# Patient Record
Sex: Male | Born: 1940 | Race: Black or African American | Hispanic: No | Marital: Married | Smoking: Former smoker
Health system: Southern US, Community
[De-identification: ages and names within clinical notes are randomized; demographics above are authoritative.]

## PROBLEM LIST (undated history)

## (undated) DIAGNOSIS — E119 Type 2 diabetes mellitus without complications: Secondary | ICD-10-CM

## (undated) DIAGNOSIS — I10 Essential (primary) hypertension: Secondary | ICD-10-CM

## (undated) DIAGNOSIS — N189 Chronic kidney disease, unspecified: Secondary | ICD-10-CM

## (undated) HISTORY — DX: Type 2 diabetes mellitus without complications: E11.9

## (undated) HISTORY — DX: Chronic kidney disease, unspecified: N18.9

## (undated) HISTORY — DX: Essential (primary) hypertension: I10

---

## 2011-04-07 DIAGNOSIS — E119 Type 2 diabetes mellitus without complications: Secondary | ICD-10-CM | POA: Diagnosis not present

## 2011-06-25 DIAGNOSIS — N184 Chronic kidney disease, stage 4 (severe): Secondary | ICD-10-CM | POA: Diagnosis not present

## 2011-06-25 DIAGNOSIS — E119 Type 2 diabetes mellitus without complications: Secondary | ICD-10-CM | POA: Diagnosis not present

## 2011-06-25 DIAGNOSIS — I10 Essential (primary) hypertension: Secondary | ICD-10-CM | POA: Diagnosis not present

## 2011-06-25 DIAGNOSIS — N400 Enlarged prostate without lower urinary tract symptoms: Secondary | ICD-10-CM | POA: Diagnosis not present

## 2011-10-29 DIAGNOSIS — N184 Chronic kidney disease, stage 4 (severe): Secondary | ICD-10-CM | POA: Diagnosis not present

## 2011-10-29 DIAGNOSIS — E119 Type 2 diabetes mellitus without complications: Secondary | ICD-10-CM | POA: Diagnosis not present

## 2011-10-29 DIAGNOSIS — D631 Anemia in chronic kidney disease: Secondary | ICD-10-CM | POA: Diagnosis not present

## 2011-10-29 DIAGNOSIS — I10 Essential (primary) hypertension: Secondary | ICD-10-CM | POA: Diagnosis not present

## 2011-10-29 DIAGNOSIS — N039 Chronic nephritic syndrome with unspecified morphologic changes: Secondary | ICD-10-CM | POA: Diagnosis not present

## 2011-11-02 DIAGNOSIS — E119 Type 2 diabetes mellitus without complications: Secondary | ICD-10-CM | POA: Diagnosis not present

## 2011-11-02 DIAGNOSIS — Z23 Encounter for immunization: Secondary | ICD-10-CM | POA: Diagnosis not present

## 2011-11-17 DIAGNOSIS — M109 Gout, unspecified: Secondary | ICD-10-CM | POA: Diagnosis present

## 2011-11-17 DIAGNOSIS — N183 Chronic kidney disease, stage 3 unspecified: Secondary | ICD-10-CM | POA: Diagnosis not present

## 2011-11-17 DIAGNOSIS — E785 Hyperlipidemia, unspecified: Secondary | ICD-10-CM | POA: Diagnosis present

## 2011-11-17 DIAGNOSIS — I6350 Cerebral infarction due to unspecified occlusion or stenosis of unspecified cerebral artery: Secondary | ICD-10-CM | POA: Diagnosis not present

## 2011-11-17 DIAGNOSIS — I10 Essential (primary) hypertension: Secondary | ICD-10-CM | POA: Diagnosis not present

## 2011-11-17 DIAGNOSIS — E119 Type 2 diabetes mellitus without complications: Secondary | ICD-10-CM | POA: Diagnosis not present

## 2011-11-17 DIAGNOSIS — Z87891 Personal history of nicotine dependence: Secondary | ICD-10-CM | POA: Diagnosis not present

## 2011-11-17 DIAGNOSIS — K083 Retained dental root: Secondary | ICD-10-CM | POA: Diagnosis not present

## 2011-11-17 DIAGNOSIS — R5383 Other fatigue: Secondary | ICD-10-CM | POA: Diagnosis not present

## 2011-11-17 DIAGNOSIS — I658 Occlusion and stenosis of other precerebral arteries: Secondary | ICD-10-CM | POA: Diagnosis not present

## 2011-11-17 DIAGNOSIS — R5381 Other malaise: Secondary | ICD-10-CM | POA: Diagnosis not present

## 2011-11-17 DIAGNOSIS — I129 Hypertensive chronic kidney disease with stage 1 through stage 4 chronic kidney disease, or unspecified chronic kidney disease: Secondary | ICD-10-CM | POA: Diagnosis present

## 2012-03-22 DIAGNOSIS — N184 Chronic kidney disease, stage 4 (severe): Secondary | ICD-10-CM | POA: Diagnosis not present

## 2012-03-22 DIAGNOSIS — E119 Type 2 diabetes mellitus without complications: Secondary | ICD-10-CM | POA: Diagnosis not present

## 2012-03-22 DIAGNOSIS — N400 Enlarged prostate without lower urinary tract symptoms: Secondary | ICD-10-CM | POA: Diagnosis not present

## 2012-03-22 DIAGNOSIS — I10 Essential (primary) hypertension: Secondary | ICD-10-CM | POA: Diagnosis not present

## 2012-03-29 DIAGNOSIS — H35039 Hypertensive retinopathy, unspecified eye: Secondary | ICD-10-CM | POA: Diagnosis not present

## 2012-03-29 DIAGNOSIS — E119 Type 2 diabetes mellitus without complications: Secondary | ICD-10-CM | POA: Diagnosis not present

## 2012-03-30 DIAGNOSIS — M109 Gout, unspecified: Secondary | ICD-10-CM | POA: Insufficient documentation

## 2012-03-30 DIAGNOSIS — H35039 Hypertensive retinopathy, unspecified eye: Secondary | ICD-10-CM | POA: Insufficient documentation

## 2012-06-21 DIAGNOSIS — I10 Essential (primary) hypertension: Secondary | ICD-10-CM | POA: Diagnosis not present

## 2012-06-21 DIAGNOSIS — N400 Enlarged prostate without lower urinary tract symptoms: Secondary | ICD-10-CM | POA: Diagnosis not present

## 2012-06-21 DIAGNOSIS — N184 Chronic kidney disease, stage 4 (severe): Secondary | ICD-10-CM | POA: Diagnosis not present

## 2012-06-21 DIAGNOSIS — E119 Type 2 diabetes mellitus without complications: Secondary | ICD-10-CM | POA: Diagnosis not present

## 2012-06-21 DIAGNOSIS — D631 Anemia in chronic kidney disease: Secondary | ICD-10-CM | POA: Diagnosis not present

## 2013-02-21 DIAGNOSIS — E119 Type 2 diabetes mellitus without complications: Secondary | ICD-10-CM | POA: Diagnosis not present

## 2013-02-21 DIAGNOSIS — D631 Anemia in chronic kidney disease: Secondary | ICD-10-CM | POA: Diagnosis not present

## 2013-02-21 DIAGNOSIS — I10 Essential (primary) hypertension: Secondary | ICD-10-CM | POA: Diagnosis not present

## 2013-02-21 DIAGNOSIS — N184 Chronic kidney disease, stage 4 (severe): Secondary | ICD-10-CM | POA: Diagnosis not present

## 2013-03-31 DIAGNOSIS — H35379 Puckering of macula, unspecified eye: Secondary | ICD-10-CM | POA: Diagnosis not present

## 2013-03-31 DIAGNOSIS — H2510 Age-related nuclear cataract, unspecified eye: Secondary | ICD-10-CM | POA: Diagnosis not present

## 2013-03-31 DIAGNOSIS — M109 Gout, unspecified: Secondary | ICD-10-CM | POA: Diagnosis not present

## 2013-03-31 DIAGNOSIS — I10 Essential (primary) hypertension: Secondary | ICD-10-CM | POA: Diagnosis not present

## 2013-03-31 DIAGNOSIS — H35039 Hypertensive retinopathy, unspecified eye: Secondary | ICD-10-CM | POA: Diagnosis not present

## 2013-03-31 DIAGNOSIS — E119 Type 2 diabetes mellitus without complications: Secondary | ICD-10-CM | POA: Diagnosis not present

## 2013-04-03 DIAGNOSIS — H35379 Puckering of macula, unspecified eye: Secondary | ICD-10-CM | POA: Insufficient documentation

## 2013-06-22 DIAGNOSIS — I10 Essential (primary) hypertension: Secondary | ICD-10-CM | POA: Diagnosis not present

## 2013-06-22 DIAGNOSIS — D631 Anemia in chronic kidney disease: Secondary | ICD-10-CM | POA: Diagnosis not present

## 2013-06-22 DIAGNOSIS — N184 Chronic kidney disease, stage 4 (severe): Secondary | ICD-10-CM | POA: Diagnosis not present

## 2013-06-22 DIAGNOSIS — E119 Type 2 diabetes mellitus without complications: Secondary | ICD-10-CM | POA: Diagnosis not present

## 2013-09-06 ENCOUNTER — Encounter: Payer: Self-pay | Admitting: Sports Medicine

## 2013-09-06 ENCOUNTER — Ambulatory Visit (INDEPENDENT_AMBULATORY_CARE_PROVIDER_SITE_OTHER): Payer: Medicare Other | Admitting: Sports Medicine

## 2013-09-06 ENCOUNTER — Ambulatory Visit (INDEPENDENT_AMBULATORY_CARE_PROVIDER_SITE_OTHER): Payer: Medicare Other

## 2013-09-06 VITALS — BP 148/80 | HR 59 | Ht 70.0 in | Wt 182.0 lb

## 2013-09-06 DIAGNOSIS — M25562 Pain in left knee: Secondary | ICD-10-CM | POA: Insufficient documentation

## 2013-09-06 DIAGNOSIS — M1A00X0 Idiopathic chronic gout, unspecified site, without tophus (tophi): Secondary | ICD-10-CM | POA: Diagnosis not present

## 2013-09-06 DIAGNOSIS — M1A09X0 Idiopathic chronic gout, multiple sites, without tophus (tophi): Secondary | ICD-10-CM

## 2013-09-06 DIAGNOSIS — N189 Chronic kidney disease, unspecified: Secondary | ICD-10-CM

## 2013-09-06 DIAGNOSIS — Z299 Encounter for prophylactic measures, unspecified: Secondary | ICD-10-CM

## 2013-09-06 DIAGNOSIS — M1710 Unilateral primary osteoarthritis, unspecified knee: Secondary | ICD-10-CM

## 2013-09-06 DIAGNOSIS — Z0000 Encounter for general adult medical examination without abnormal findings: Secondary | ICD-10-CM | POA: Insufficient documentation

## 2013-09-06 DIAGNOSIS — D509 Iron deficiency anemia, unspecified: Secondary | ICD-10-CM

## 2013-09-06 DIAGNOSIS — E118 Type 2 diabetes mellitus with unspecified complications: Secondary | ICD-10-CM

## 2013-09-06 DIAGNOSIS — N289 Disorder of kidney and ureter, unspecified: Secondary | ICD-10-CM

## 2013-09-06 DIAGNOSIS — M1A9XX0 Chronic gout, unspecified, without tophus (tophi): Secondary | ICD-10-CM

## 2013-09-06 DIAGNOSIS — I10 Essential (primary) hypertension: Secondary | ICD-10-CM | POA: Diagnosis not present

## 2013-09-06 DIAGNOSIS — IMO0002 Reserved for concepts with insufficient information to code with codable children: Secondary | ICD-10-CM | POA: Diagnosis not present

## 2013-09-06 DIAGNOSIS — N186 End stage renal disease: Secondary | ICD-10-CM | POA: Insufficient documentation

## 2013-09-06 DIAGNOSIS — E119 Type 2 diabetes mellitus without complications: Secondary | ICD-10-CM | POA: Diagnosis not present

## 2013-09-06 DIAGNOSIS — M25569 Pain in unspecified knee: Secondary | ICD-10-CM

## 2013-09-06 DIAGNOSIS — M1120 Other chondrocalcinosis, unspecified site: Secondary | ICD-10-CM | POA: Insufficient documentation

## 2013-09-06 DIAGNOSIS — D539 Nutritional anemia, unspecified: Secondary | ICD-10-CM

## 2013-09-06 DIAGNOSIS — Z992 Dependence on renal dialysis: Secondary | ICD-10-CM | POA: Insufficient documentation

## 2013-09-06 IMAGING — CR DG KNEE COMPLETE 4+V*L*
3 series · 3 of 3 positions shown · non-contrast
Comparison: None.

CLINICAL DATA: Left knee pain

EXAM:
LEFT KNEE - COMPLETE 4+ VIEW

[view not recorded (1 of 3)]
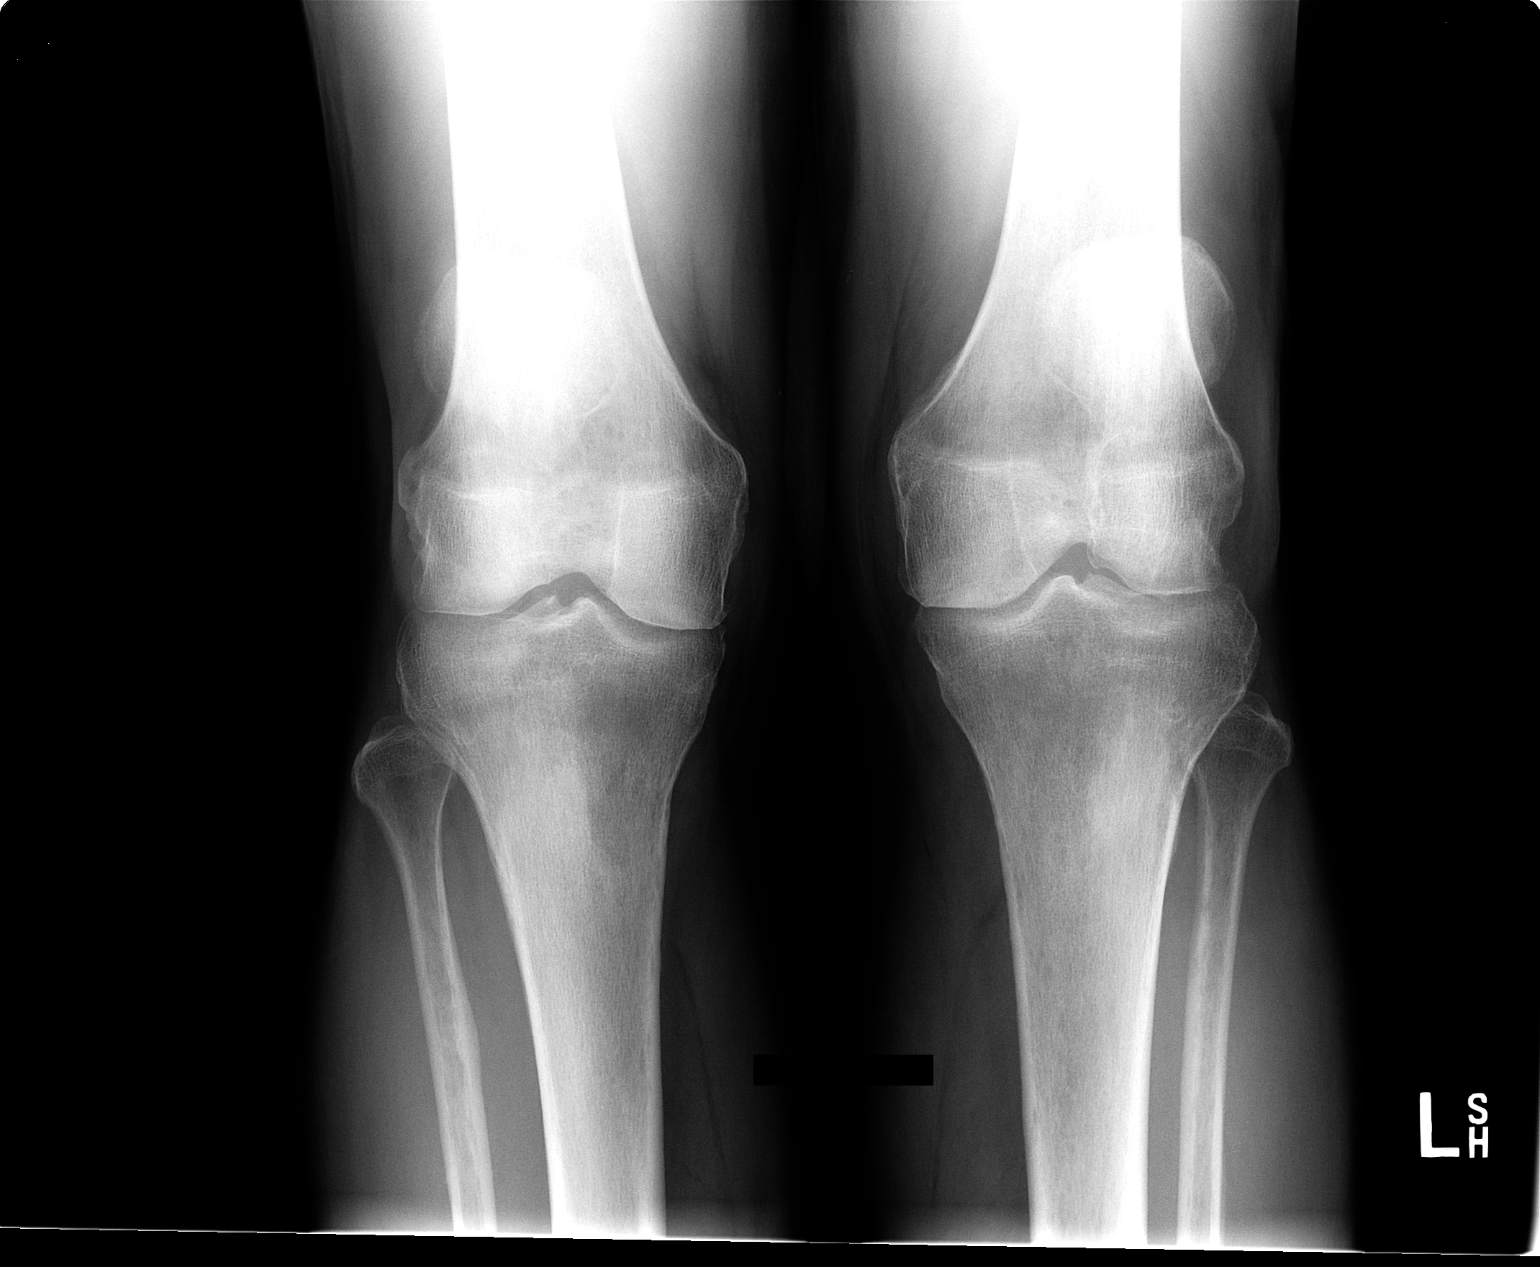

[view not recorded (2 of 3)]
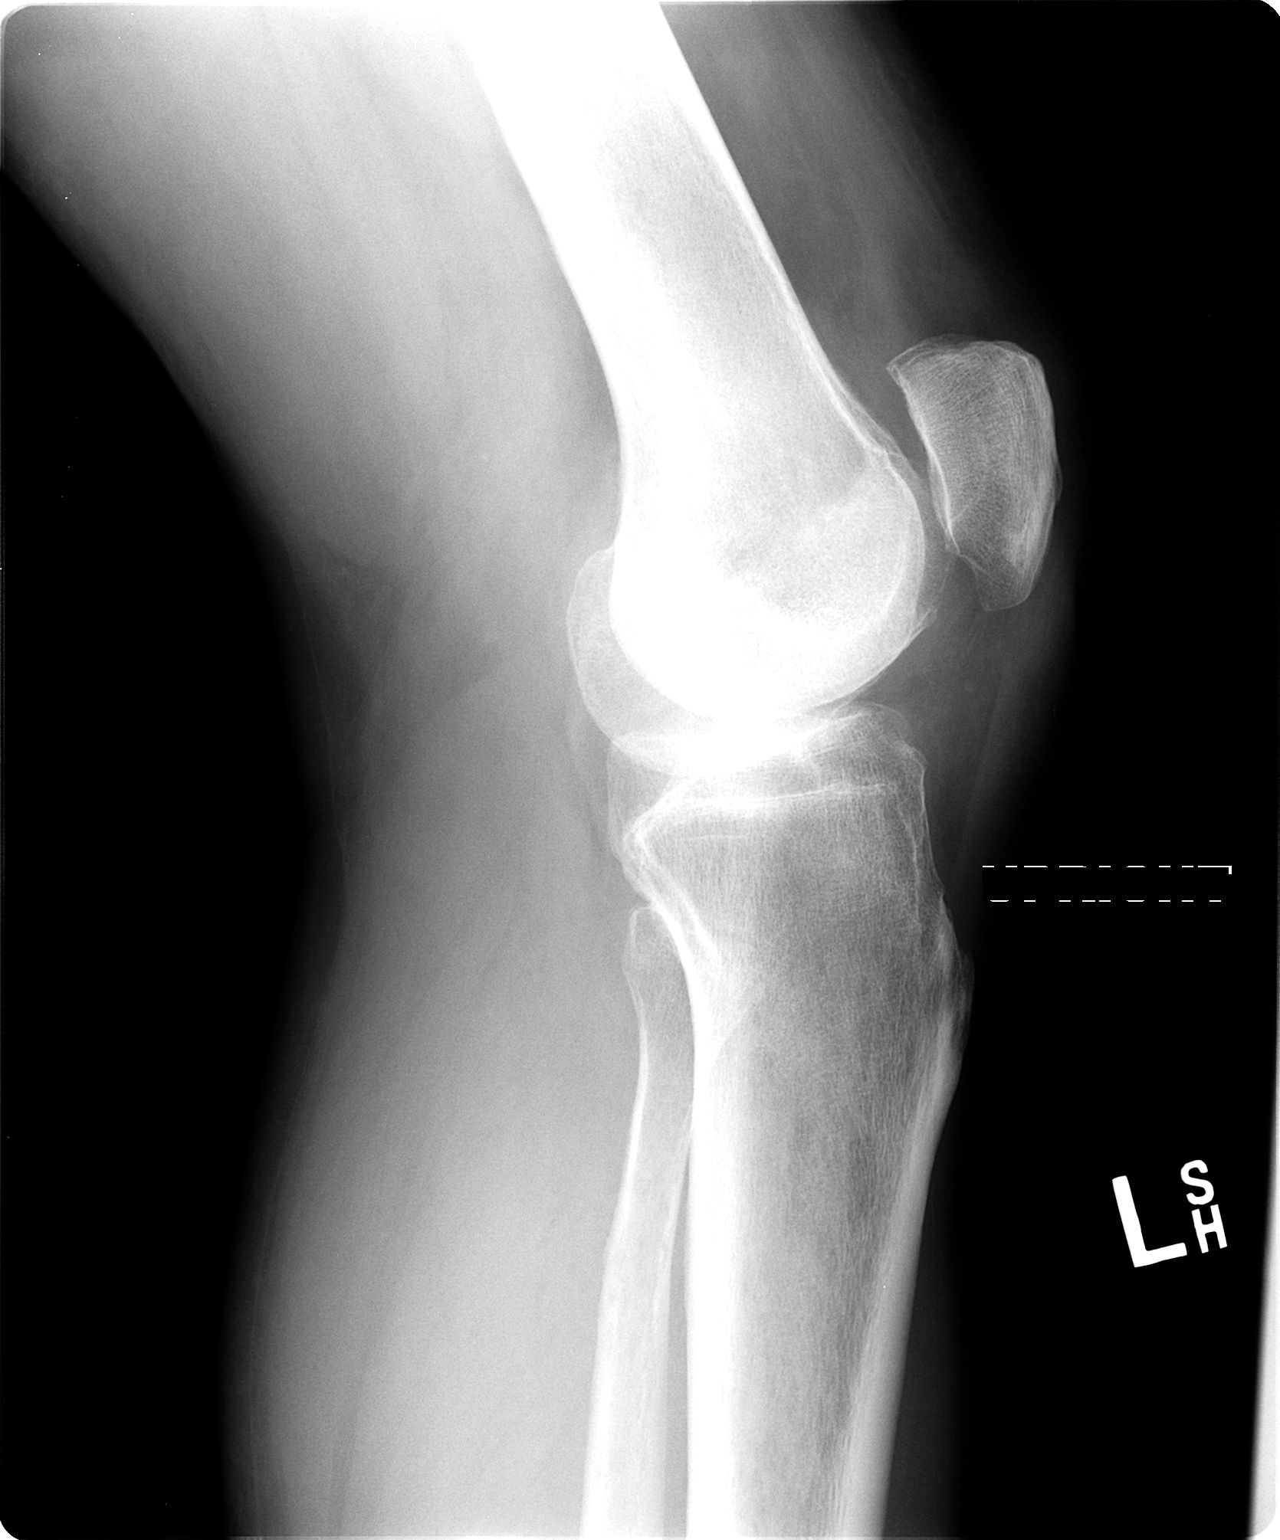

[view not recorded (3 of 3)]
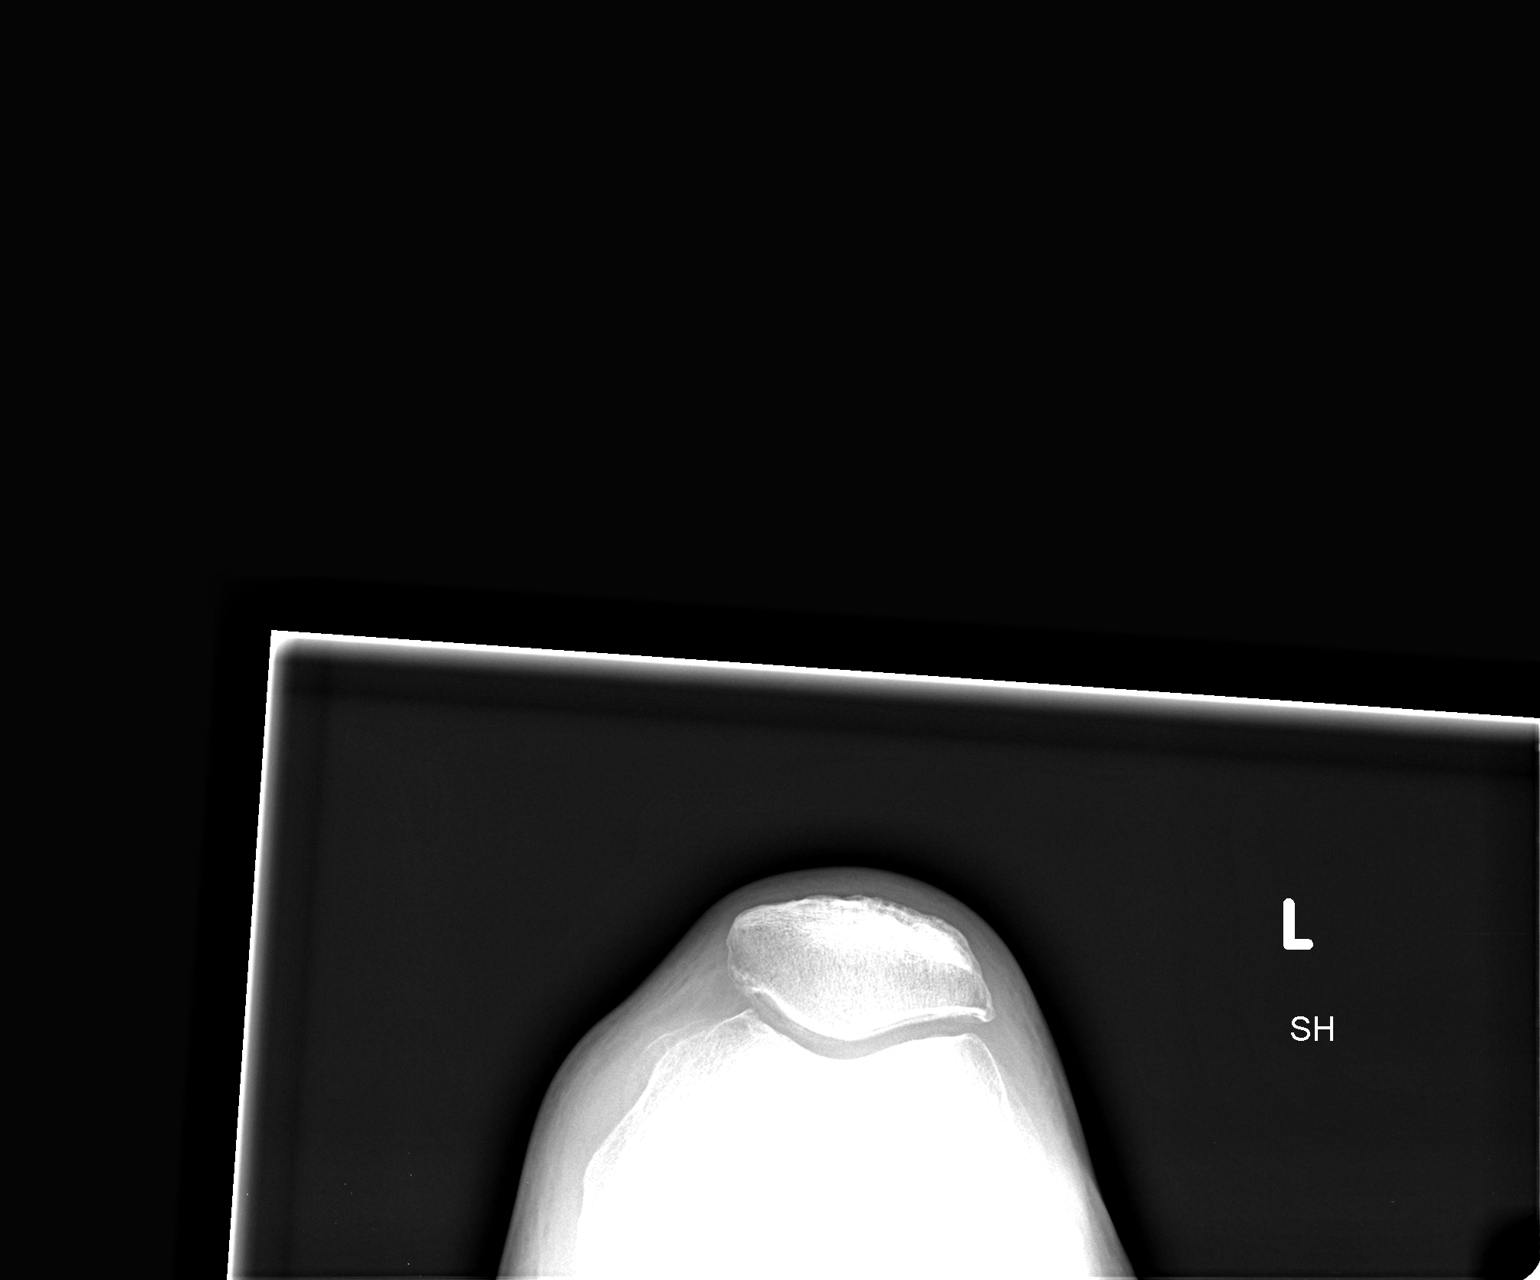

[3 of 3 positions shown; findings below may reference images not displayed]

FINDINGS: No acute fracture or dislocation. Mild tricompartmental
osteoarthritis. Small joint effusion. No lytic or blastic osseous
lesion. Normal soft tissues.
IMPRESSION: Mild tricompartmental osteoarthritis.

## 2013-09-06 IMAGING — CR DG KNEE COMPLETE 4+V*L*
1 series · 1 of 1 positions shown · non-contrast
Comparison: None.

CLINICAL DATA: Left knee pain

EXAM:
LEFT KNEE - COMPLETE 4+ VIEW

[view not recorded]
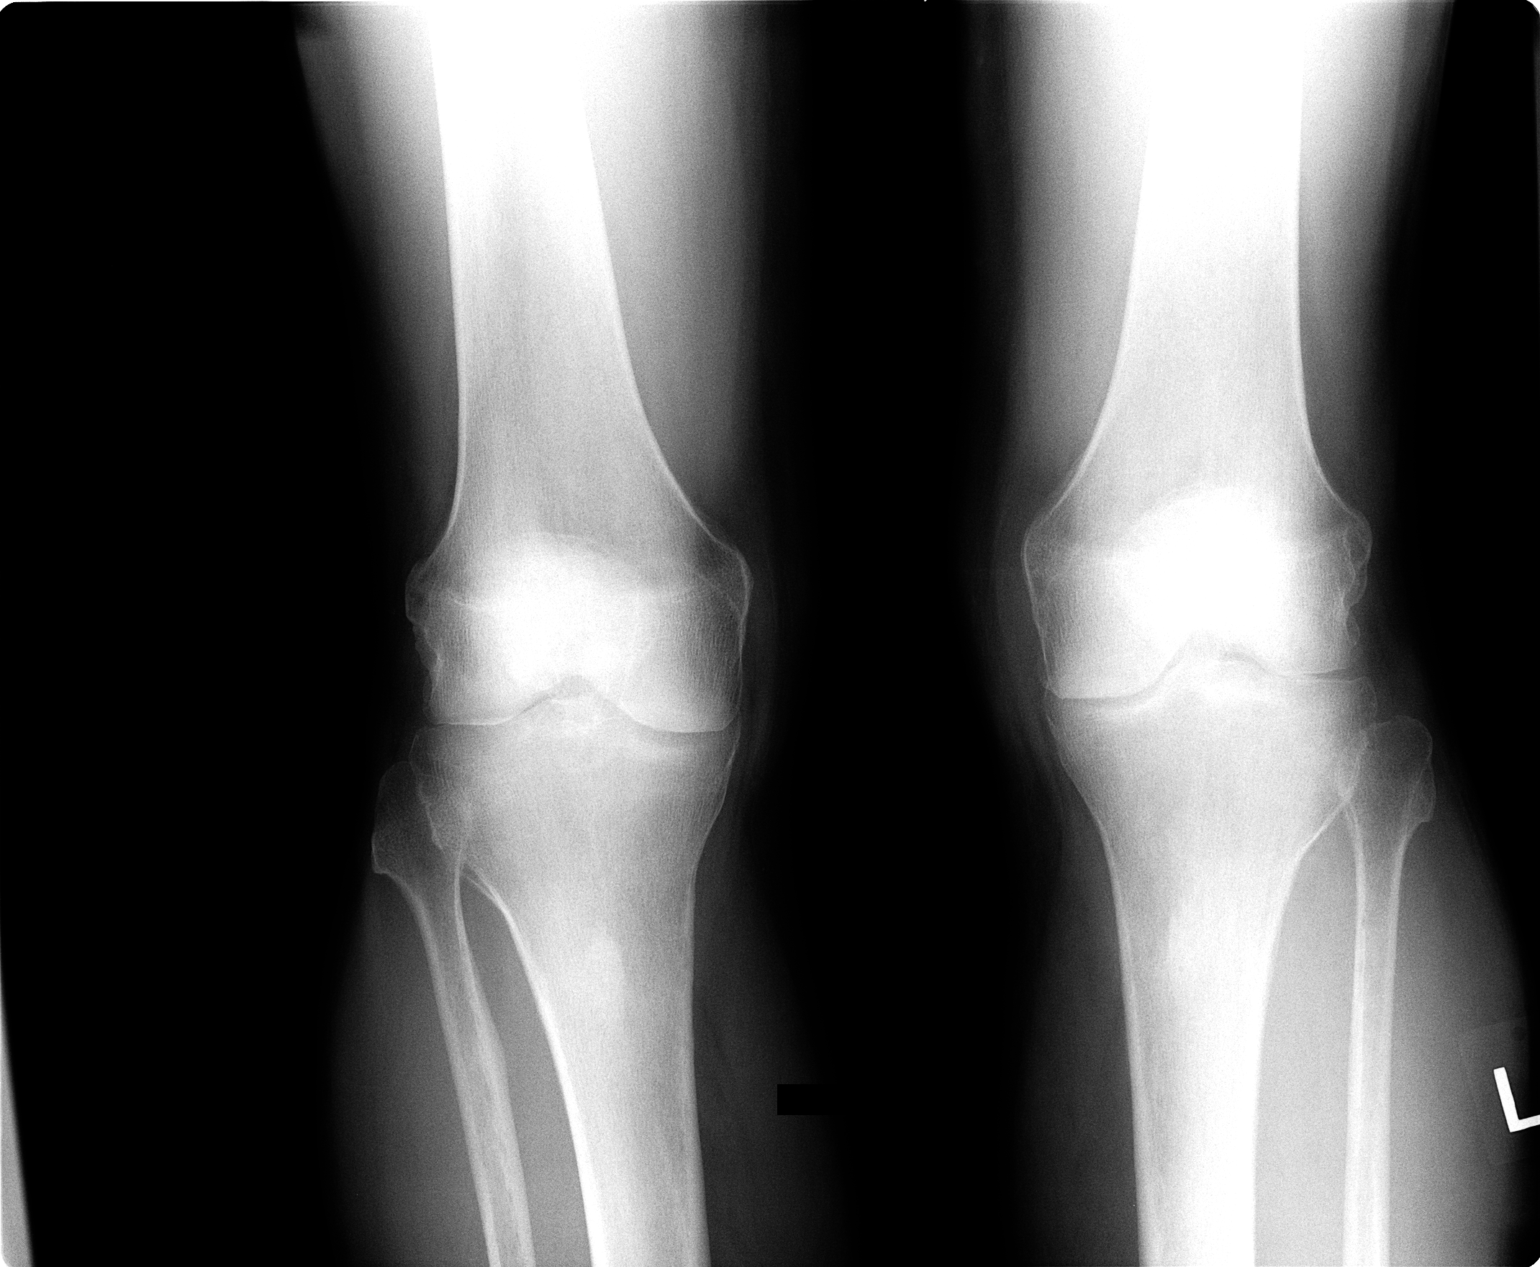

[1 of 1 positions shown; findings below may reference images not displayed]

FINDINGS: No acute fracture or dislocation. Mild tricompartmental
osteoarthritis. Small joint effusion. No lytic or blastic osseous
lesion. Normal soft tissues.
IMPRESSION: Mild tricompartmental osteoarthritis.

## 2013-09-06 NOTE — Assessment & Plan Note (Signed)
He has a Medicare physical coming up.

## 2013-09-06 NOTE — Assessment & Plan Note (Signed)
Continue losartan, and Actos. No metformin due to renal insufficiency.

## 2013-09-06 NOTE — Assessment & Plan Note (Signed)
Slightly elevated, we will keep an eye on this.

## 2013-09-06 NOTE — Assessment & Plan Note (Signed)
Checking labs, we will avoid nephrotoxic agents.

## 2013-09-06 NOTE — Assessment & Plan Note (Signed)
Continue allopurinol. Rechecking uric acid levels.

## 2013-09-06 NOTE — Progress Notes (Signed)
  Subjective:    CC: Establish care.   HPI:  Diabetes mellitus type 2: Has been well-controlled on Actos, he does have renal insufficiency and has not taken metformin.  Renal insufficiency: Due to uncontrolled hypertension in the past.  Hypertension: Is typically well controlled on current regimen, no headaches, visual changes, chest pain.  Left knee pain: Moderate, persistent, localized to the medial joint line, came on gradually, was only minimally swollen. Tylenol was tremendous the affected.  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  Past medical history, Surgical history, Family history not pertinant except as noted below, Social history, Allergies, and medications have been entered into the medical record, reviewed, and no changes needed.   Review of Systems: No headache, visual changes, nausea, vomiting, diarrhea, constipation, dizziness, abdominal pain, skin rash, fevers, chills, night sweats, swollen lymph nodes, weight loss, chest pain, body aches, joint swelling, muscle aches, shortness of breath, mood changes, visual or auditory hallucinations.  Objective:    General: Well Developed, well nourished, and in no acute distress.  Neuro: Alert and oriented x3, extra-ocular muscles intact, sensation grossly intact.  HEENT: Normocephalic, atraumatic, pupils equal round reactive to light, neck supple, no masses, no lymphadenopathy, thyroid nonpalpable.  Skin: Warm and dry, no rashes noted.  Cardiac: Regular rate and rhythm, no murmurs rubs or gallops.  Respiratory: Clear to auscultation bilaterally. Not using accessory muscles, speaking in full sentences.  Abdominal: Soft, nontender, nondistended, positive bowel sounds, no masses, no organomegaly.  Left Knee: Normal to inspection with no erythema or effusion or obvious bony abnormalities. Palpation normal with no warmth or joint line tenderness or patellar tenderness or condyle  tenderness. ROM normal in flexion and extension and lower leg rotation. Ligaments with solid consistent endpoints including ACL, PCL, LCL, MCL. Negative Mcmurray's and provocative meniscal tests. Non painful patellar compression. Patellar and quadriceps tendons unremarkable. Hamstring and quadriceps strength is normal.  X-rays do show mild osteoarthritis.  Impression and Recommendations:    The patient was counselled, risk factors were discussed, anticipatory guidance given.

## 2013-09-06 NOTE — Assessment & Plan Note (Signed)
Most likely osteoarthritis. X-rays. Injections for flares, need to avoid NSAIDs.

## 2013-09-07 DIAGNOSIS — M1A9XX0 Chronic gout, unspecified, without tophus (tophi): Secondary | ICD-10-CM | POA: Diagnosis not present

## 2013-09-07 DIAGNOSIS — M1A00X0 Idiopathic chronic gout, unspecified site, without tophus (tophi): Secondary | ICD-10-CM | POA: Diagnosis not present

## 2013-09-07 DIAGNOSIS — N189 Chronic kidney disease, unspecified: Secondary | ICD-10-CM | POA: Diagnosis not present

## 2013-09-07 DIAGNOSIS — E118 Type 2 diabetes mellitus with unspecified complications: Secondary | ICD-10-CM | POA: Diagnosis not present

## 2013-09-07 LAB — CBC
HCT: 34.4 % — ABNORMAL LOW (ref 39.0–52.0)
Hemoglobin: 11.2 g/dL — ABNORMAL LOW (ref 13.0–17.0)
MCH: 24.3 pg — ABNORMAL LOW (ref 26.0–34.0)
MCHC: 32.6 g/dL (ref 30.0–36.0)
MCV: 74.8 fL — ABNORMAL LOW (ref 78.0–100.0)
Platelets: 175 10*3/uL (ref 150–400)
RBC: 4.6 MIL/uL (ref 4.22–5.81)
RDW: 18.8 % — ABNORMAL HIGH (ref 11.5–15.5)
WBC: 5.9 10*3/uL (ref 4.0–10.5)

## 2013-09-08 DIAGNOSIS — N189 Chronic kidney disease, unspecified: Secondary | ICD-10-CM | POA: Insufficient documentation

## 2013-09-08 DIAGNOSIS — D631 Anemia in chronic kidney disease: Secondary | ICD-10-CM | POA: Insufficient documentation

## 2013-09-08 LAB — COMPREHENSIVE METABOLIC PANEL WITH GFR
Albumin: 4.1 g/dL (ref 3.5–5.2)
Glucose, Bld: 143 mg/dL — ABNORMAL HIGH (ref 70–99)
Potassium: 5.5 meq/L — ABNORMAL HIGH (ref 3.5–5.3)
Sodium: 137 meq/L (ref 135–145)
Total Bilirubin: 0.5 mg/dL (ref 0.2–1.2)
Total Protein: 6.9 g/dL (ref 6.0–8.3)

## 2013-09-08 LAB — LIPID PANEL
Cholesterol: 151 mg/dL (ref 0–200)
HDL: 41 mg/dL (ref >39)
LDL Cholesterol: 89 mg/dL (ref 0–99)
Total CHOL/HDL Ratio: 3.7 Ratio
Triglycerides: 103 mg/dL (ref <150)
VLDL: 21 mg/dL (ref 0–40)

## 2013-09-08 LAB — COMPREHENSIVE METABOLIC PANEL
ALT: 18 U/L (ref 0–53)
AST: 28 U/L (ref 0–37)
Alkaline Phosphatase: 78 U/L (ref 39–117)
BUN: 42 mg/dL — ABNORMAL HIGH (ref 6–23)
CO2: 22 mEq/L (ref 19–32)
Calcium: 9.5 mg/dL (ref 8.4–10.5)
Chloride: 106 mEq/L (ref 96–112)
Creat: 3.19 mg/dL — ABNORMAL HIGH (ref 0.50–1.35)

## 2013-09-08 LAB — IRON AND TIBC
%SAT: 28 % (ref 20–55)
Iron: 107 ug/dL (ref 42–165)
TIBC: 384 ug/dL (ref 215–435)
UIBC: 277 ug/dL (ref 125–400)

## 2013-09-08 LAB — HEMOGLOBIN A1C
Hgb A1c MFr Bld: 6.9 % — ABNORMAL HIGH (ref <5.7)
Mean Plasma Glucose: 151 mg/dL — ABNORMAL HIGH (ref <117)

## 2013-09-08 LAB — FERRITIN: Ferritin: 95 ng/mL (ref 22–322)

## 2013-09-08 LAB — TSH: TSH: 1.508 u[IU]/mL (ref 0.350–4.500)

## 2013-09-08 LAB — URIC ACID: Uric Acid, Serum: 3.7 mg/dL — ABNORMAL LOW (ref 4.0–7.8)

## 2013-09-08 NOTE — Addendum Note (Signed)
Addended by: Silverio Decamp on: 09/08/2013 09:48 AM   Modules accepted: Orders

## 2013-09-08 NOTE — Assessment & Plan Note (Signed)
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 Also adding hemoglobin electrophoresis.

## 2013-09-12 LAB — HEMOGLOBINOPATHY EVALUATION
Hemoglobin Other: 0 %
Hgb A2 Quant: 3 % (ref 2.2–3.2)
Hgb A: 97 % (ref 96.8–97.8)
Hgb F Quant: 0 % (ref 0.0–2.0)
Hgb S Quant: 0 %

## 2013-10-24 DIAGNOSIS — E119 Type 2 diabetes mellitus without complications: Secondary | ICD-10-CM | POA: Diagnosis not present

## 2013-10-24 DIAGNOSIS — I10 Essential (primary) hypertension: Secondary | ICD-10-CM | POA: Diagnosis not present

## 2013-10-24 DIAGNOSIS — N184 Chronic kidney disease, stage 4 (severe): Secondary | ICD-10-CM | POA: Diagnosis not present

## 2014-01-23 DIAGNOSIS — E119 Type 2 diabetes mellitus without complications: Secondary | ICD-10-CM | POA: Diagnosis not present

## 2014-01-23 DIAGNOSIS — N184 Chronic kidney disease, stage 4 (severe): Secondary | ICD-10-CM | POA: Diagnosis not present

## 2014-01-23 DIAGNOSIS — D631 Anemia in chronic kidney disease: Secondary | ICD-10-CM | POA: Diagnosis not present

## 2014-01-23 DIAGNOSIS — I10 Essential (primary) hypertension: Secondary | ICD-10-CM | POA: Diagnosis not present

## 2014-02-21 DIAGNOSIS — Z23 Encounter for immunization: Secondary | ICD-10-CM | POA: Diagnosis not present

## 2014-04-26 DIAGNOSIS — E119 Type 2 diabetes mellitus without complications: Secondary | ICD-10-CM | POA: Diagnosis not present

## 2014-04-26 DIAGNOSIS — D631 Anemia in chronic kidney disease: Secondary | ICD-10-CM | POA: Diagnosis not present

## 2014-04-26 DIAGNOSIS — N184 Chronic kidney disease, stage 4 (severe): Secondary | ICD-10-CM | POA: Diagnosis not present

## 2014-04-26 DIAGNOSIS — I10 Essential (primary) hypertension: Secondary | ICD-10-CM | POA: Diagnosis not present

## 2014-06-21 DIAGNOSIS — H35033 Hypertensive retinopathy, bilateral: Secondary | ICD-10-CM | POA: Diagnosis not present

## 2014-06-21 DIAGNOSIS — H2513 Age-related nuclear cataract, bilateral: Secondary | ICD-10-CM | POA: Diagnosis not present

## 2014-06-21 DIAGNOSIS — H269 Unspecified cataract: Secondary | ICD-10-CM | POA: Diagnosis not present

## 2014-06-21 DIAGNOSIS — E119 Type 2 diabetes mellitus without complications: Secondary | ICD-10-CM | POA: Diagnosis not present

## 2014-06-21 DIAGNOSIS — I10 Essential (primary) hypertension: Secondary | ICD-10-CM | POA: Diagnosis not present

## 2014-06-21 DIAGNOSIS — Z87891 Personal history of nicotine dependence: Secondary | ICD-10-CM | POA: Diagnosis not present

## 2014-07-17 DIAGNOSIS — L2084 Intrinsic (allergic) eczema: Secondary | ICD-10-CM | POA: Diagnosis not present

## 2014-07-27 DIAGNOSIS — N184 Chronic kidney disease, stage 4 (severe): Secondary | ICD-10-CM | POA: Diagnosis not present

## 2014-07-27 DIAGNOSIS — D649 Anemia, unspecified: Secondary | ICD-10-CM | POA: Diagnosis not present

## 2014-11-02 DIAGNOSIS — N184 Chronic kidney disease, stage 4 (severe): Secondary | ICD-10-CM | POA: Diagnosis not present

## 2014-11-29 ENCOUNTER — Encounter: Payer: Self-pay | Admitting: Sports Medicine

## 2014-11-29 ENCOUNTER — Ambulatory Visit (INDEPENDENT_AMBULATORY_CARE_PROVIDER_SITE_OTHER): Payer: Medicare Other | Admitting: Sports Medicine

## 2014-11-29 DIAGNOSIS — E119 Type 2 diabetes mellitus without complications: Secondary | ICD-10-CM | POA: Diagnosis not present

## 2014-11-29 DIAGNOSIS — I10 Essential (primary) hypertension: Secondary | ICD-10-CM

## 2014-11-29 MED ORDER — CARVEDILOL 6.25 MG PO TABS: 6.2500 mg | ORAL_TABLET | Freq: Two times a day (BID) | ORAL | Status: DC

## 2014-11-29 MED ORDER — AMLODIPINE BESY-BENAZEPRIL HCL 10-20 MG PO CAPS: 1.0000 | ORAL_CAPSULE | Freq: Every day | ORAL | Status: DC

## 2014-11-29 MED ORDER — BAYER CONTOUR MONITOR W/DEVICE KIT: PACK | Status: AC

## 2014-11-29 NOTE — Assessment & Plan Note (Signed)
Uncontrolled, we are going to change his entire regimen. Discontinue losartan, clonidine. Continue amlodipine, switching to [employer], this does have a thiazide and so he understands that we will need to keep an eye on his gout symptoms. Switching atenolol to low-dose carvedilol. Return to see me in 2 weeks.

## 2014-11-29 NOTE — Assessment & Plan Note (Signed)
Patient will return for a complete physical, he will continue his Actos. I am going to order routine blood work. Refilling his glucose meter.

## 2014-11-29 NOTE — Progress Notes (Signed)
  Subjective:    CC: Fluctuating blood pressure  HPI: Hypertension: Has only been minimally controlled with losartan, amlodipine, atenolol, and he was recently placed on clonidine by another provider. Unfortunately his blood pressures continue to fluctuate as expected with clonidine, as high as 200 sometimes. He denies any headaches, visual changes, chest pain.  Diabetes mellitus type 2: Has been stable we haven't checked blood work in some time, he is also due for multiple preventive measures.  Gout: Under good control.  Past medical history, Surgical history, Family history not pertinant except as noted below, Social history, Allergies, and medications have been entered into the medical record, reviewed, and no changes needed.   Review of Systems: No fevers, chills, night sweats, weight loss, chest pain, or shortness of breath.   Objective:    General: Well Developed, well nourished, and in no acute distress.  Neuro: Alert and oriented x3, extra-ocular muscles intact, sensation grossly intact.  HEENT: Normocephalic, atraumatic, pupils equal round reactive to light, neck supple, no masses, no lymphadenopathy, thyroid nonpalpable.  Skin: Warm and dry, no rashes. Cardiac: Regular rate and rhythm, no murmurs rubs or gallops, no lower extremity edema.  Respiratory: Clear to auscultation bilaterally. Not using accessory muscles, speaking in full sentences.  Impression and Recommendations:

## 2014-11-30 DIAGNOSIS — E119 Type 2 diabetes mellitus without complications: Secondary | ICD-10-CM | POA: Diagnosis not present

## 2014-12-01 LAB — COMPREHENSIVE METABOLIC PANEL WITH GFR
AST: 21 U/L (ref 10–35)
Albumin: 4.1 g/dL (ref 3.6–5.1)
BUN: 43 mg/dL — ABNORMAL HIGH (ref 7–25)
Creat: 3.53 mg/dL — ABNORMAL HIGH (ref 0.70–1.18)
Sodium: 138 mmol/L (ref 135–146)
Total Bilirubin: 0.5 mg/dL (ref 0.2–1.2)
Total Protein: 7.4 g/dL (ref 6.1–8.1)

## 2014-12-01 LAB — LIPID PANEL
Cholesterol: 147 mg/dL (ref 125–200)
HDL: 43 mg/dL (ref >=40)
LDL Cholesterol: 90 mg/dL (ref <130)
Total CHOL/HDL Ratio: 3.4 ratio (ref <=5.0)
Triglycerides: 68 mg/dL (ref <150)
VLDL: 14 mg/dL (ref <30)

## 2014-12-01 LAB — COMPREHENSIVE METABOLIC PANEL
ALT: 12 U/L (ref 9–46)
Alkaline Phosphatase: 86 U/L (ref 40–115)
CO2: 21 mmol/L (ref 20–31)
Calcium: 9.4 mg/dL (ref 8.6–10.3)
Chloride: 107 mmol/L (ref 98–110)
Glucose, Bld: 136 mg/dL — ABNORMAL HIGH (ref 65–99)
Potassium: 4.8 mmol/L (ref 3.5–5.3)

## 2014-12-01 LAB — CBC
HCT: 34.1 % — ABNORMAL LOW (ref 39.0–52.0)
Hemoglobin: 10.7 g/dL — ABNORMAL LOW (ref 13.0–17.0)
MCH: 24.5 pg — ABNORMAL LOW (ref 26.0–34.0)
MCHC: 31.4 g/dL (ref 30.0–36.0)
MCV: 78.2 fL (ref 78.0–100.0)
Platelets: 155 K/uL (ref 150–400)
RBC: 4.36 MIL/uL (ref 4.22–5.81)
RDW: 20.1 % — ABNORMAL HIGH (ref 11.5–15.5)
WBC: 6.3 K/uL (ref 4.0–10.5)

## 2014-12-01 LAB — HEMOGLOBIN A1C
Hgb A1c MFr Bld: 6.6 % — ABNORMAL HIGH (ref <5.7)
Mean Plasma Glucose: 143 mg/dL — ABNORMAL HIGH (ref <117)

## 2014-12-04 ENCOUNTER — Telehealth: Payer: Self-pay

## 2014-12-04 NOTE — Telephone Encounter (Signed)
It has only been a few days, give it some time, is he definitely taking the new medication benazepril/amlodipine daily? Also spironolactone is not a good idea considering his renal insufficiency, it can potentially result in severe hyperkalemia, what was his heart rate when he has been checking his blood pressures?

## 2014-12-04 NOTE — Telephone Encounter (Signed)
SPOKE TO PATIENT GAVE HIM LAB RESULTS PATIENT STATED THAT THE NEW BLOOD PRESSURE MEDICATION IS NOT WORKING FOR HIM HIS BP IS STILL RANGING FROM 140 TO 170s FOR  TOP NUMBER. PATIENT WOULD LIKE TO TRY SPIRONOLACTONE IF POSSIBLE. PLEASE ADVISE. Zakir Henner,CMA \

## 2014-12-05 NOTE — Telephone Encounter (Signed)
Per Adonis Brook from Urgent Care patient walked in yestersday after 5:00 inquiring a phone message that he had left regarding blood pressure medication. Nurse advised patient of Dr. Darene Lamer note and advised him to give the medication some time to work. Patient stated that if his blood presssure was up to 200 he will take an extra pill, nurse stated that she couldnt advise that but to go to the ER if it does reach 200 or more. Patient understood information. Rhonda Cunningham,CMA

## 2014-12-08 ENCOUNTER — Encounter: Payer: Self-pay | Admitting: Sports Medicine

## 2014-12-08 ENCOUNTER — Ambulatory Visit (INDEPENDENT_AMBULATORY_CARE_PROVIDER_SITE_OTHER): Payer: Medicare Other | Admitting: Sports Medicine

## 2014-12-08 DIAGNOSIS — I10 Essential (primary) hypertension: Secondary | ICD-10-CM | POA: Diagnosis not present

## 2014-12-08 MED ORDER — HYDRALAZINE HCL 25 MG PO TABS: 25.0000 mg | ORAL_TABLET | Freq: Three times a day (TID) | ORAL | Status: DC

## 2014-12-08 MED ORDER — CARVEDILOL 25 MG PO TABS: 12.5000 mg | ORAL_TABLET | Freq: Two times a day (BID) | ORAL | Status: DC

## 2014-12-08 NOTE — Assessment & Plan Note (Signed)
Persistently elevated, increasing carvedilol to 12.5 mg twice a day. Adding hydralazine.  Return weekly for blood pressure checks and adjustments.

## 2014-12-08 NOTE — Progress Notes (Signed)
  Subjective:    CC:  Follow-up blood pressure  HPI: This is a pleasant 74 year old male with chronic renal insufficiency, we have struggled to control his blood pressure, at the last visit I revamped his entire regimen, we switched him to amlodipine/benazepril max dose, and low-dose carvedilol, he returns today with blood pressure even worse. No current headaches, visual changes, chest pain, shortness of breath, leg swelling.  Past medical history, Surgical history, Family history not pertinant except as noted below, Social history, Allergies, and medications have been entered into the medical record, reviewed, and no changes needed.   Review of Systems: No fevers, chills, night sweats, weight loss, chest pain, or shortness of breath.   Objective:    General: Well Developed, well nourished, and in no acute distress.  Neuro: Alert and oriented x3, extra-ocular muscles intact, sensation grossly intact.  HEENT: Normocephalic, atraumatic, pupils equal round reactive to light, neck supple, no masses, no lymphadenopathy, thyroid nonpalpable.  Skin: Warm and dry, no rashes. Cardiac: Regular rate and rhythm, no murmurs rubs or gallops, no lower extremity edema.  Respiratory: Clear to auscultation bilaterally. Not using accessory muscles, speaking in full sentences.  Impression and Recommendations:    I spent 25 minutes with this patient, greater than 50% was face-to-face time counseling regarding the above diagnoses

## 2014-12-08 NOTE — Patient Instructions (Signed)
Double your current carvedilol tabs to 2 tabs twice a day, when you run out use the new tablet at one half tab twice a day. Also adding hydralazine, return in one week for a blood pressure check.

## 2014-12-09 ENCOUNTER — Encounter: Payer: Self-pay | Admitting: Sports Medicine

## 2014-12-11 ENCOUNTER — Encounter: Payer: Self-pay | Admitting: Sports Medicine

## 2014-12-12 ENCOUNTER — Encounter: Payer: Self-pay | Admitting: Sports Medicine

## 2014-12-12 ENCOUNTER — Ambulatory Visit (INDEPENDENT_AMBULATORY_CARE_PROVIDER_SITE_OTHER): Payer: Medicare Other | Admitting: Sports Medicine

## 2014-12-12 VITALS — BP 162/62 | HR 85 | Ht 70.0 in | Wt 176.0 lb

## 2014-12-12 DIAGNOSIS — I10 Essential (primary) hypertension: Secondary | ICD-10-CM | POA: Diagnosis not present

## 2014-12-12 DIAGNOSIS — I150 Renovascular hypertension: Secondary | ICD-10-CM

## 2014-12-12 NOTE — Progress Notes (Signed)
  Subjective:    CC: follow-up  HPI: This pleasant 74 year old male with renal insufficiency returns for follow-up of his hypertension, currently he is on 25 mg of hydralazine 3 times a day, 12.5 mg of carvedilol twice a day, and enalapril/amlodipine daily. His blood pressures have been fairly labile as high as [id number] systolic and as low as [id number] systolic. Denies any headaches, visual changes, chest pain, shortness of breath, or any focal neurologic symptoms.  Past medical history, Surgical history, Family history not pertinant except as noted below, Social history, Allergies, and medications have been entered into the medical record, reviewed, and no changes needed.   Review of Systems: No fevers, chills, night sweats, weight loss, chest pain, or shortness of breath.   Objective:    General: Well Developed, well nourished, and in no acute distress.  Neuro: Alert and oriented x3, extra-ocular muscles intact, sensation grossly intact.  HEENT: Normocephalic, atraumatic, pupils equal round reactive to light, neck supple, no masses, no lymphadenopathy, thyroid nonpalpable.  Skin: Warm and dry, no rashes. Cardiac: Regular rate and rhythm, no murmurs rubs or gallops, no lower extremity edema.  Respiratory: Clear to auscultation bilaterally. Not using accessory muscles, speaking in full sentences.  Impression and Recommendations:    I spent 25 minutes with this patient, greater than 50% was face-to-face time counseling regarding the above diagnoses, Patient was also educated on signs and symptoms that should prompt a visit to the emergency department.

## 2014-12-12 NOTE — Assessment & Plan Note (Signed)
Slow improvement on max dose amlodipine/benazepril, 12.5 mg of Coreg twice a day and hydralazine 25 mg times per day. Blood pressures in the 0000000 systolic today, we are going to increase carvedilol to a full 25 mg Twice a day. Continue home blood pressure measurements and weekly follow-ups.

## 2014-12-12 NOTE — Addendum Note (Signed)
Addended by: Silverio Decamp on: 12/12/2014 04:20 PM   Modules accepted: Orders

## 2014-12-13 ENCOUNTER — Encounter: Payer: Self-pay | Admitting: Sports Medicine

## 2014-12-13 ENCOUNTER — Ambulatory Visit: Payer: Medicare Other | Admitting: Sports Medicine

## 2014-12-14 ENCOUNTER — Ambulatory Visit (HOSPITAL_COMMUNITY)
Admission: RE | Admit: 2014-12-14 | Discharge: 2014-12-14 | Disposition: A | Discharge status: Home / Self Care | Payer: Medicare Other | Source: Ambulatory Visit | Attending: Internal Medicine | Admitting: Internal Medicine

## 2014-12-14 DIAGNOSIS — I10 Essential (primary) hypertension: Secondary | ICD-10-CM | POA: Diagnosis not present

## 2014-12-14 DIAGNOSIS — I701 Atherosclerosis of renal artery: Secondary | ICD-10-CM | POA: Insufficient documentation

## 2014-12-14 NOTE — Progress Notes (Signed)
Preliminary results by tech - Renal Duplex Completed. No evidence of renal artery stenosis in the right and left main renal arteries. 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 Bilateral resistance indices appear abnormal.  Oda Cogan, BS, RDMS, RVT

## 2014-12-18 ENCOUNTER — Other Ambulatory Visit: Payer: Self-pay | Admitting: Sports Medicine

## 2014-12-18 DIAGNOSIS — I701 Atherosclerosis of renal artery: Secondary | ICD-10-CM | POA: Insufficient documentation

## 2014-12-18 NOTE — Assessment & Plan Note (Signed)
Unclear as to whether this could be a contributort o his difficult to control hypertension. I'm going to refer him to vascular surgery simply for consultation and advice.

## 2014-12-19 ENCOUNTER — Encounter: Payer: Self-pay | Admitting: Sports Medicine

## 2014-12-19 ENCOUNTER — Ambulatory Visit (INDEPENDENT_AMBULATORY_CARE_PROVIDER_SITE_OTHER): Payer: Medicare Other | Admitting: Sports Medicine

## 2014-12-19 VITALS — BP 147/64 | HR 70 | Ht 70.0 in | Wt 179.0 lb

## 2014-12-19 DIAGNOSIS — I701 Atherosclerosis of renal artery: Secondary | ICD-10-CM | POA: Diagnosis not present

## 2014-12-19 DIAGNOSIS — I10 Essential (primary) hypertension: Secondary | ICD-10-CM

## 2014-12-19 MED ORDER — HYDRALAZINE HCL 50 MG PO TABS: 50.0000 mg | ORAL_TABLET | Freq: Three times a day (TID) | ORAL | Status: DC

## 2014-12-19 NOTE — Progress Notes (Signed)
  Subjective:    CC: Follow-up  HPI: This pleasant 74 year old male returns for follow-up of his difficult to control hypertension, currently he is on amlodipine/benazepril max dose, carvedilol max dose, and hydralazine 25 mg 3 times a day, blood pressure has improved significantly but is still mildly high, no headaches, visual changes, chest pain. 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  Past medical history, Surgical history, Family history not pertinant except as noted below, Social history, Allergies, and medications have been entered into the medical record, reviewed, and no changes needed.   Review of Systems: No fevers, chills, night sweats, weight loss, chest pain, or shortness of breath.   Objective:    General: Well Developed, well nourished, and in no acute distress.  Neuro: Alert and oriented x3, extra-ocular muscles intact, sensation grossly intact.  HEENT: Normocephalic, atraumatic, pupils equal round reactive to light, neck supple, no masses, no lymphadenopathy, thyroid nonpalpable.  Skin: Warm and dry, no rashes. Cardiac: Regular rate and rhythm, no murmurs rubs or gallops, no lower extremity edema.  Respiratory: Clear to auscultation bilaterally. Not using accessory muscles, speaking in full sentences.  Impression and Recommendations:    I spent 40 minutes with this patient, greater than 50% was face-to-face time counseling regarding the above diagnoses, I spent a great till of time explaining the natural history as well as treatment of hypertension as well as anatomy of renal artery stenosis.

## 2014-12-19 NOTE — Assessment & Plan Note (Signed)
We are getting closer to good blood pressure control, continue amlodipine/benazepril max dose, carvedilol max dose, and we are going to increase hydralazine to 50 mg 3 times per day, return in 2 weeks. 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

## 2015-01-02 ENCOUNTER — Encounter: Payer: Self-pay | Admitting: Sports Medicine

## 2015-01-02 ENCOUNTER — Ambulatory Visit (INDEPENDENT_AMBULATORY_CARE_PROVIDER_SITE_OTHER): Payer: Medicare Other | Admitting: Sports Medicine

## 2015-01-02 VITALS — BP 145/68 | HR 83 | Temp 97.7°F | Resp 18 | Wt 175.2 lb

## 2015-01-02 DIAGNOSIS — I10 Essential (primary) hypertension: Secondary | ICD-10-CM

## 2015-01-02 DIAGNOSIS — Z23 Encounter for immunization: Secondary | ICD-10-CM | POA: Diagnosis not present

## 2015-01-02 DIAGNOSIS — E119 Type 2 diabetes mellitus without complications: Secondary | ICD-10-CM

## 2015-01-02 NOTE — Progress Notes (Signed)
  Subjective:    CC: follow-up  HPI: Hypertension: Somewhat labile, but overall doing okay. He is taking max dose Lotrel, he has self decreased his carvedilol to one half tab twice a day, tells me he did feel somewhat faint, and he is continuing to take hydralazine 50 mg 3 times per day. No headaches, visual changes, chest pain. No presyncopal symptoms since decreasing to one half tab carvedilol.  Diabetes mellitus type 2: Due for some vaccines.  Past medical history, Surgical history, Family history not pertinant except as noted below, Social history, Allergies, and medications have been entered into the medical record, reviewed, and no changes needed.   Review of Systems: No fevers, chills, night sweats, weight loss, chest pain, or shortness of breath.   Objective:    General: Well Developed, well nourished, and in no acute distress.  Neuro: Alert and oriented x3, extra-ocular muscles intact, sensation grossly intact.  HEENT: Normocephalic, atraumatic, pupils equal round reactive to light, neck supple, no masses, no lymphadenopathy, thyroid nonpalpable.  Skin: Warm and dry, no rashes. Cardiac: Regular rate and rhythm, no murmurs rubs or gallops, no lower extremity edema.  Respiratory: Clear to auscultation bilaterally. Not using accessory muscles, speaking in full sentences.  Impression and Recommendations:    I spent 25 minutes with this patient, greater than 50% was face-to-face time counseling regarding the above diagnoses

## 2015-01-02 NOTE — Assessment & Plan Note (Signed)
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 Currently doing one half of an amlodipine tablet, Lotrel, and hydralazine.

## 2015-01-02 NOTE — Assessment & Plan Note (Signed)
Controlled, no changes in medications. Catch him up on screening protocols.

## 2015-01-25 ENCOUNTER — Other Ambulatory Visit: Payer: Self-pay | Admitting: Sports Medicine

## 2015-01-25 DIAGNOSIS — I10 Essential (primary) hypertension: Secondary | ICD-10-CM

## 2015-01-25 MED ORDER — AMLODIPINE BESY-BENAZEPRIL HCL 10-20 MG PO CAPS: 1.0000 | ORAL_CAPSULE | Freq: Every day | ORAL | Status: DC

## 2015-01-29 DIAGNOSIS — N184 Chronic kidney disease, stage 4 (severe): Secondary | ICD-10-CM | POA: Diagnosis not present

## 2015-02-20 DIAGNOSIS — N186 End stage renal disease: Secondary | ICD-10-CM | POA: Diagnosis not present

## 2015-02-20 DIAGNOSIS — Z01818 Encounter for other preprocedural examination: Secondary | ICD-10-CM | POA: Diagnosis not present

## 2015-03-09 DIAGNOSIS — N184 Chronic kidney disease, stage 4 (severe): Secondary | ICD-10-CM | POA: Diagnosis not present

## 2015-03-09 DIAGNOSIS — N185 Chronic kidney disease, stage 5: Secondary | ICD-10-CM | POA: Insufficient documentation

## 2015-04-04 ENCOUNTER — Encounter: Payer: Self-pay | Admitting: Sports Medicine

## 2015-04-04 ENCOUNTER — Ambulatory Visit (INDEPENDENT_AMBULATORY_CARE_PROVIDER_SITE_OTHER): Payer: Medicare Other | Admitting: Sports Medicine

## 2015-04-04 VITALS — BP 133/66 | HR 84 | Wt 174.0 lb

## 2015-04-04 DIAGNOSIS — I10 Essential (primary) hypertension: Secondary | ICD-10-CM

## 2015-04-04 DIAGNOSIS — E119 Type 2 diabetes mellitus without complications: Secondary | ICD-10-CM

## 2015-04-04 DIAGNOSIS — J069 Acute upper respiratory infection, unspecified: Secondary | ICD-10-CM | POA: Insufficient documentation

## 2015-04-04 LAB — POCT GLYCOSYLATED HEMOGLOBIN (HGB A1C): Hemoglobin A1C: 6

## 2015-04-04 MED ORDER — FLUTICASONE PROPIONATE 50 MCG/ACT NA SUSP: NASAL | Status: DC

## 2015-04-04 NOTE — Assessment & Plan Note (Signed)
With predominantly rhinitis type symptoms, a bit of postnasal drip and a mild cough, exam is benign and lungs are clear. Adding Flonase.

## 2015-04-04 NOTE — Assessment & Plan Note (Signed)
Well controlled, no changes 

## 2015-04-04 NOTE — Progress Notes (Signed)
  Subjective:    CC:  Follow-up  HPI:  hypertension: Well controlled   Diabetes mellitus type 2: Well-controlled, up-to-date on I exam, needs a foot exam.   Preventive measures: Had some questions about PSA screening, discussed the studies and limitations of the test.   Right nose: Mild, with minimal tightness in his chest and cough, history of asthma or COPD. Overall feels well.  Past medical history, Surgical history, Family history not pertinant except as noted below, Social history, Allergies, and medications have been entered into the medical record, reviewed, and no changes needed.   Review of Systems: No fevers, chills, night sweats, weight loss, chest pain, or shortness of breath.   Objective:    General: Well Developed, well nourished, and in no acute distress.  Neuro: Alert and oriented x3, extra-ocular muscles intact, sensation grossly intact.  HEENT: Normocephalic, atraumatic, pupils equal round reactive to light, neck supple, no masses, no lymphadenopathy, thyroid nonpalpable. Oropharynx, nasopharynx, ear canals unremarkable with the exception of minimal pharyngeal erythema likely secondary to postnasal drip. Skin: Warm and dry, no rashes. Cardiac: Regular rate and rhythm, no murmurs rubs or gallops, no lower extremity edema.  Respiratory: Clear to auscultation bilaterally. Not using accessory muscles, speaking in full sentences.  Diabetic Foot Exam Both feet were examined, there are no signs of ulceration or abnormal callus. Nails are unremarkable. Dorsalis pedis and posterior tibial pulses are palpable. Sensation is intact to sharp and monofilament. Shoes are of appropriate fitment.  Impression and Recommendations:

## 2015-04-04 NOTE — Assessment & Plan Note (Signed)
Well-controlled, no changes in medications, foot exam performed today, up-to-date on eye exam.

## 2015-04-30 DIAGNOSIS — N184 Chronic kidney disease, stage 4 (severe): Secondary | ICD-10-CM | POA: Diagnosis not present

## 2015-06-29 ENCOUNTER — Other Ambulatory Visit: Payer: Self-pay

## 2015-06-29 DIAGNOSIS — I10 Essential (primary) hypertension: Secondary | ICD-10-CM

## 2015-06-29 MED ORDER — CARVEDILOL 25 MG PO TABS: 12.5000 mg | ORAL_TABLET | Freq: Two times a day (BID) | ORAL | Status: DC

## 2015-08-01 DIAGNOSIS — N184 Chronic kidney disease, stage 4 (severe): Secondary | ICD-10-CM | POA: Diagnosis not present

## 2015-09-06 ENCOUNTER — Ambulatory Visit: Payer: Medicare Other | Admitting: Sports Medicine

## 2015-09-12 ENCOUNTER — Ambulatory Visit (INDEPENDENT_AMBULATORY_CARE_PROVIDER_SITE_OTHER): Payer: Medicare Other | Admitting: Sports Medicine

## 2015-09-12 ENCOUNTER — Encounter: Payer: Self-pay | Admitting: Sports Medicine

## 2015-09-12 VITALS — BP 139/67 | HR 87 | Resp 18 | Wt 174.7 lb

## 2015-09-12 DIAGNOSIS — G250 Essential tremor: Secondary | ICD-10-CM | POA: Diagnosis not present

## 2015-09-12 NOTE — Assessment & Plan Note (Addendum)
This is benign, essentially asymptomatic to the patient, noticed by his wife, no treatment needed. The did have concerns that this represented Parkinson's disease versus parkinsonism's, however the tremor is not classic, not pill-rolling, he has no masklike facies or changes in his gait. I offered second opinion from neurology but they declined. Return to see me on an as-needed basis.

## 2015-09-12 NOTE — Progress Notes (Signed)
  Subjective:    CC: Question Parkinson's disease  HPI: This is a pleasant 75 year old male with chronic renal insufficiency, his wife has noticed an occasional tremor in his hands, as well as loss of facial expressions. He has not noticed this, it doesn't affect his life, he has no abnormalities of gait, no family history of Parkinson's disease. No head trauma.  Past medical history, Surgical history, Family history not pertinant except as noted below, Social history, Allergies, and medications have been entered into the medical record, reviewed, and no changes needed.   Review of Systems: No fevers, chills, night sweats, weight loss, chest pain, or shortness of breath.   Objective:    General: Well Developed, well nourished, and in no acute distress.  Neuro: Alert and oriented x3, extra-ocular muscles intact, sensation grossly intact. Cranial nerves II through XII are intact, motor, sensory, coordinative functions are all intact, gait is unremarkable, facial expressions are normal, minimal essential-type intention tremor not present at rest in both hands. HEENT: Normocephalic, atraumatic, pupils equal round reactive to light, neck supple, no masses, no lymphadenopathy, thyroid nonpalpable.  Skin: Warm and dry, no rashes. Cardiac: Regular rate and rhythm, no murmurs rubs or gallops, no lower extremity edema.  Respiratory: Clear to auscultation bilaterally. Not using accessory muscles, speaking in full sentences.  Impression and Recommendations:    I spent 25 minutes with this patient, greater than 50% was face-to-face time counseling regarding the above diagnoses

## 2015-09-12 NOTE — Patient Instructions (Signed)
Essential Tremor A tremor is trembling or shaking that you cannot control. Most tremors affect the hands or arms. Tremors can also affect the head, vocal cords, face, and other parts of the body.  Essential tremor is a tremor without a known cause.  CAUSES Essential tremor has no known cause.  RISK FACTORS You may be at greater risk of essential tremor if:   You have a family member with essential tremor.   You are age 76 or older.   You take certain medicines. SIGNS AND SYMPTOMS The main sign of a tremor is uncontrolled and unintentional rhythmic shaking of a body part.  You may have difficulty eating with a spoon or fork.   You may have difficulty writing.   You may nod your head up and down or side to side.   You may have a quivering voice.  Your tremors:  May get worse over time.   May come and go.   May be more noticeable on one side of your body.   May get worse due to stress, fatigue, caffeine, and extreme heat or cold.  DIAGNOSIS Your health care provider can diagnose essential tremor based on your symptoms, medical history, and a physical examination. There is no single test to diagnose an essential tremor. However, your health care provider may perform a variety of tests to rule out other conditions. Tests may include:   Blood and urine tests.   Imaging studies of your brain, such as:   CT scan.   MRI.   A test that measures involuntary muscle movement (electromyogram). TREATMENT Your tremors may go away without treatment. Mild tremors may not need treatment if they do not affect your day-to-day life. Severe tremors may need to be treated using one or a combination of the following options:   Medicines. This may include medicine that is injected.  Lifestyle changes.   Physical therapy.  HOME CARE INSTRUCTIONS  Take medicines only as directed by your health care provider.   Limit alcohol intake to no more than 1 drink per day for  nonpregnant women and 2 drinks per day for men. One drink equals 12 oz of beer, 5 oz of wine, or 1 oz of hard liquor.  Do not use any tobacco products, including cigarettes, chewing tobacco, or electronic cigarettes. If you need help quitting, ask your health care provider.  Take medicines only as directed by your health care provider.   Avoid extreme heat or cold.   Limit the amount of caffeine you consumeas directed by your health care provider.   Try to get eight hours of sleep each night.  Find ways to manage your stress, such as meditation or yoga.  Keep all follow-up visits as directed by your health care provider. This is important. This includes any physical therapy visits. SEEK MEDICAL CARE IF:  You experience any changes in the location or intensity of your tremors.   You start having a tremor after starting a new medicine.   You have tremor with other symptoms such as:   Numbness.   Tingling.   Pain.   Weakness.   Your tremor gets worse.   Your tremor interferes with your daily life.    This information is not intended to replace advice given to you by your health care provider. Make sure you discuss any questions you have with your health care provider.   Document Released: 03/03/2014 Document Reviewed: 03/03/2014 Elsevier Interactive Patient Education 2016 [place]  disease is a disorder of the central nervous system, which includes the brain and spinal cord. A person with this disease slowly loses the ability to completely control body movements. Within the brain, there is a group of nerve cells (basal ganglia) that help control movement. The basal ganglia are damaged and do not work properly in a person with Parkinson disease. In addition, the basal ganglia produce and use a brain chemical called dopamine. The dopamine chemical sends messages to other parts of the body to control and coordinate body movements.  Dopamine levels are low in a person with Parkinson disease. If the dopamine levels are low, then the body does not receive the correct messages it needs to move normally.  CAUSES  The exact reason why the basal ganglia get damaged is not known. Some medical researchers have thought that infection, genes, environment, and certain medicines may contribute to the cause.  SYMPTOMS   An early symptom of Parkinson disease is often an uncontrolled shaking (tremor) of the hands. The tremor will often disappear when the affected hand is consciously used.  As the disease progresses, walking, talking, getting out of a chair, and new movements become more difficult.  Muscles get stiff and movements become slower.  Balance and coordination become harder.  Depression, trouble swallowing, urinary problems, constipation, and sleep problems can occur.  Later in the disease, memory and thought processes may deteriorate. DIAGNOSIS  There are no specific tests to diagnose Parkinson disease. You may be referred to a neurologist for evaluation. Your caregiver will ask about your medical history, symptoms, and perform a physical exam. Blood tests and imaging tests of your brain may be performed to rule out other diseases. The imaging tests may include an MRI or a CT scan. TREATMENT  The goal of treatment is to relieve symptoms. Medicines may be prescribed once the symptoms become troublesome. Medicine will not stop the progression of the disease, but medicine can make movement and balance better and help control tremors. Speech and occupational therapy may also be prescribed. Sometimes, surgical treatment of the brain can be done in young people. HOME CARE INSTRUCTIONS  Get regular exercise and rest periods during the day to help prevent exhaustion and depression.  If getting dressed becomes difficult, replace buttons and zippers with Velcro and elastic on your clothing.  Take all medicine as directed by your  caregiver.  Install grab bars or railings in your home to prevent falls.  Go to speech or occupational therapy as directed.  Keep all follow-up visits as directed by your caregiver. SEEK MEDICAL CARE IF:  Your symptoms are not controlled with your medicine.  You fall.  You have trouble swallowing or choke on your food. MAKE SURE YOU:  Understand these instructions.  Will watch your condition.  Will get help right away if you are not doing well or get worse.   This information is not intended to replace advice given to you by your health care provider. Make sure you discuss any questions you have with your health care provider.   Document Released: 02/08/2000 Document Revised: 06/07/2012 Document Reviewed: 03/12/2011 Elsevier Interactive Patient Education [employer].

## 2015-11-07 DIAGNOSIS — N185 Chronic kidney disease, stage 5: Secondary | ICD-10-CM | POA: Diagnosis not present

## 2015-12-05 ENCOUNTER — Ambulatory Visit (INDEPENDENT_AMBULATORY_CARE_PROVIDER_SITE_OTHER): Payer: Medicare Other

## 2015-12-05 ENCOUNTER — Encounter: Payer: Self-pay | Admitting: Sports Medicine

## 2015-12-05 ENCOUNTER — Ambulatory Visit (INDEPENDENT_AMBULATORY_CARE_PROVIDER_SITE_OTHER): Payer: Medicare Other | Admitting: Sports Medicine

## 2015-12-05 VITALS — BP 139/62 | HR 65 | Resp 18 | Wt 169.4 lb

## 2015-12-05 DIAGNOSIS — M1A09X0 Idiopathic chronic gout, multiple sites, without tophus (tophi): Secondary | ICD-10-CM

## 2015-12-05 DIAGNOSIS — M5137 Other intervertebral disc degeneration, lumbosacral region: Secondary | ICD-10-CM

## 2015-12-05 DIAGNOSIS — E119 Type 2 diabetes mellitus without complications: Secondary | ICD-10-CM

## 2015-12-05 DIAGNOSIS — M545 Low back pain: Secondary | ICD-10-CM | POA: Diagnosis not present

## 2015-12-05 DIAGNOSIS — M5136 Other intervertebral disc degeneration, lumbar region: Secondary | ICD-10-CM | POA: Diagnosis not present

## 2015-12-05 DIAGNOSIS — I10 Essential (primary) hypertension: Secondary | ICD-10-CM

## 2015-12-05 DIAGNOSIS — M48061 Spinal stenosis, lumbar region without neurogenic claudication: Secondary | ICD-10-CM | POA: Insufficient documentation

## 2015-12-05 DIAGNOSIS — Z23 Encounter for immunization: Secondary | ICD-10-CM | POA: Diagnosis not present

## 2015-12-05 DIAGNOSIS — M51369 Other intervertebral disc degeneration, lumbar region without mention of lumbar back pain or lower extremity pain: Secondary | ICD-10-CM

## 2015-12-05 LAB — POCT GLYCOSYLATED HEMOGLOBIN (HGB A1C): Hemoglobin A1C: 5.7

## 2015-12-05 IMAGING — DX DG LUMBAR SPINE COMPLETE 4+V
6 series · 6 of 6 positions shown · non-contrast
Comparison: None in PACs

CLINICAL DATA: One month of low back pain which began when bending
over while painting month ago. No discrete episode of trauma.

EXAM:
LUMBAR SPINE - COMPLETE 4+ VIEW

[l-spine ap]
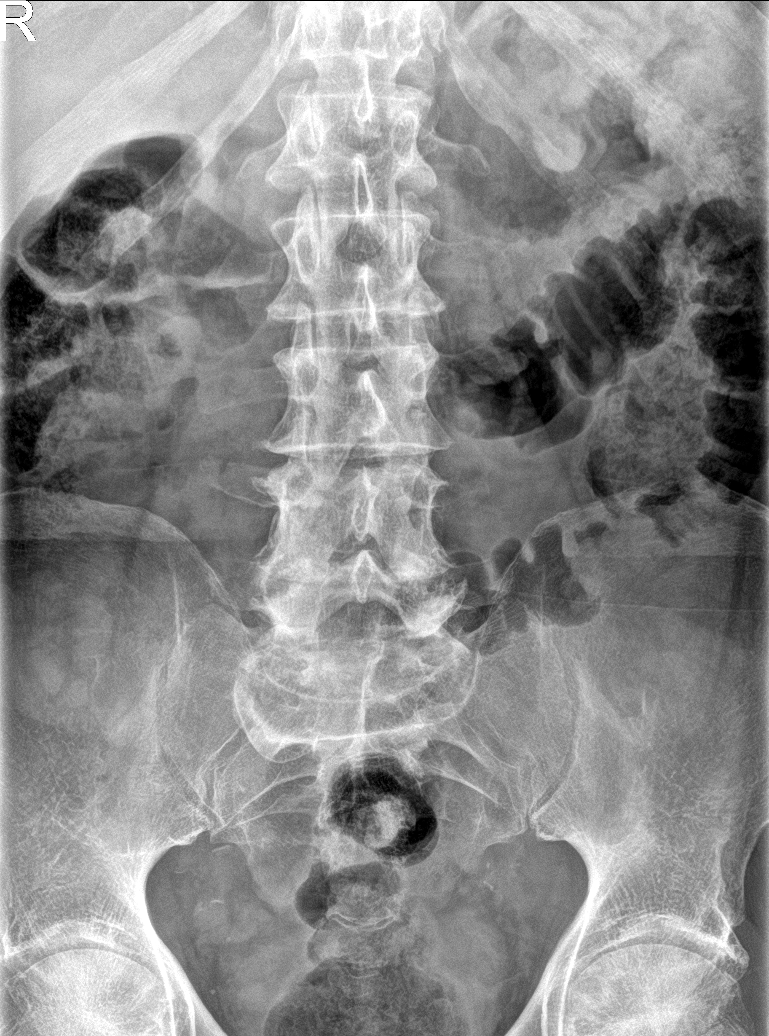

[l-spine obl (1 of 3)]
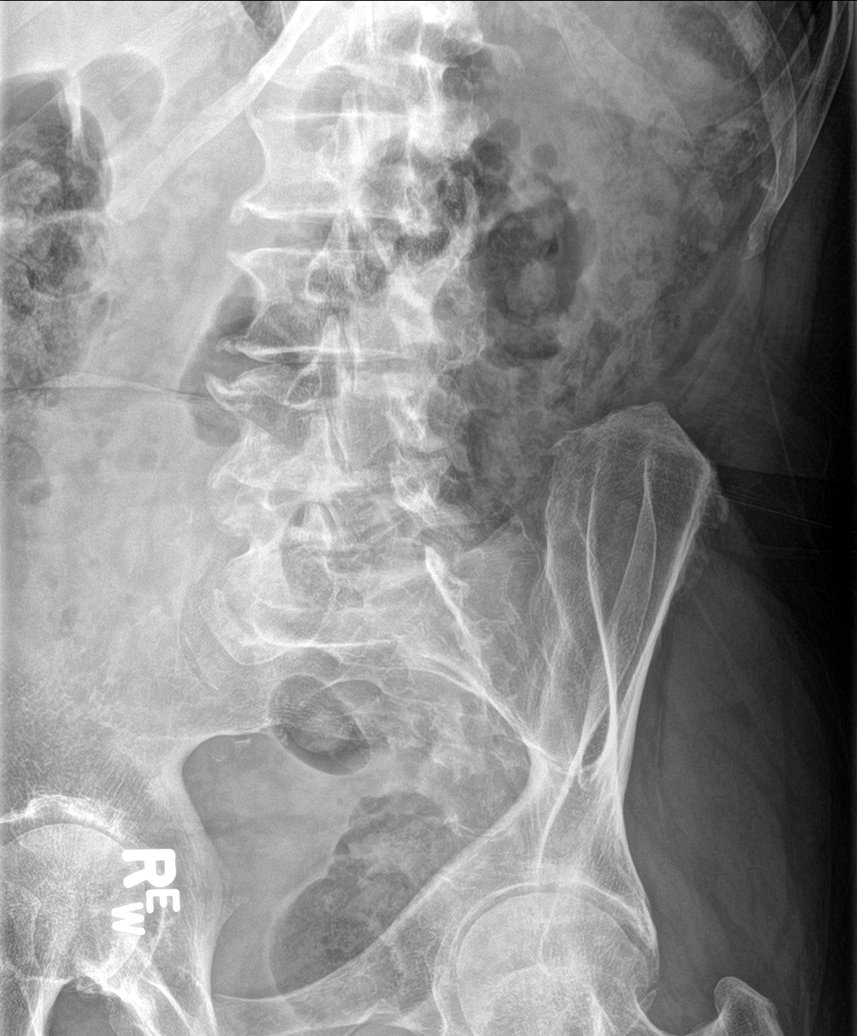

[l-spine lat]
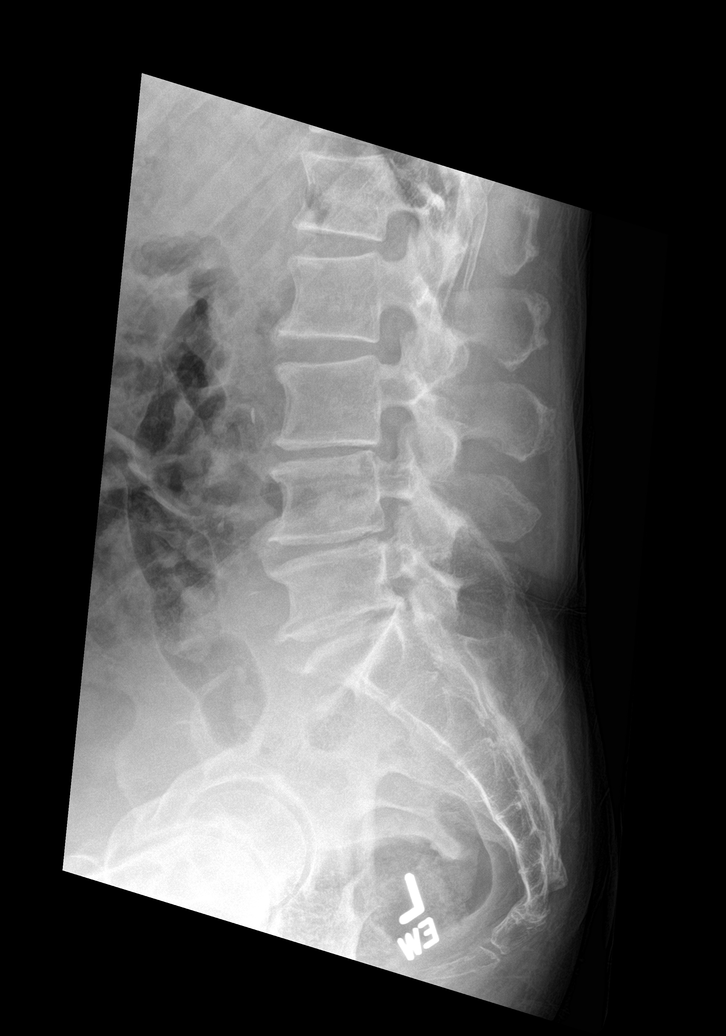

[l-spine spot]
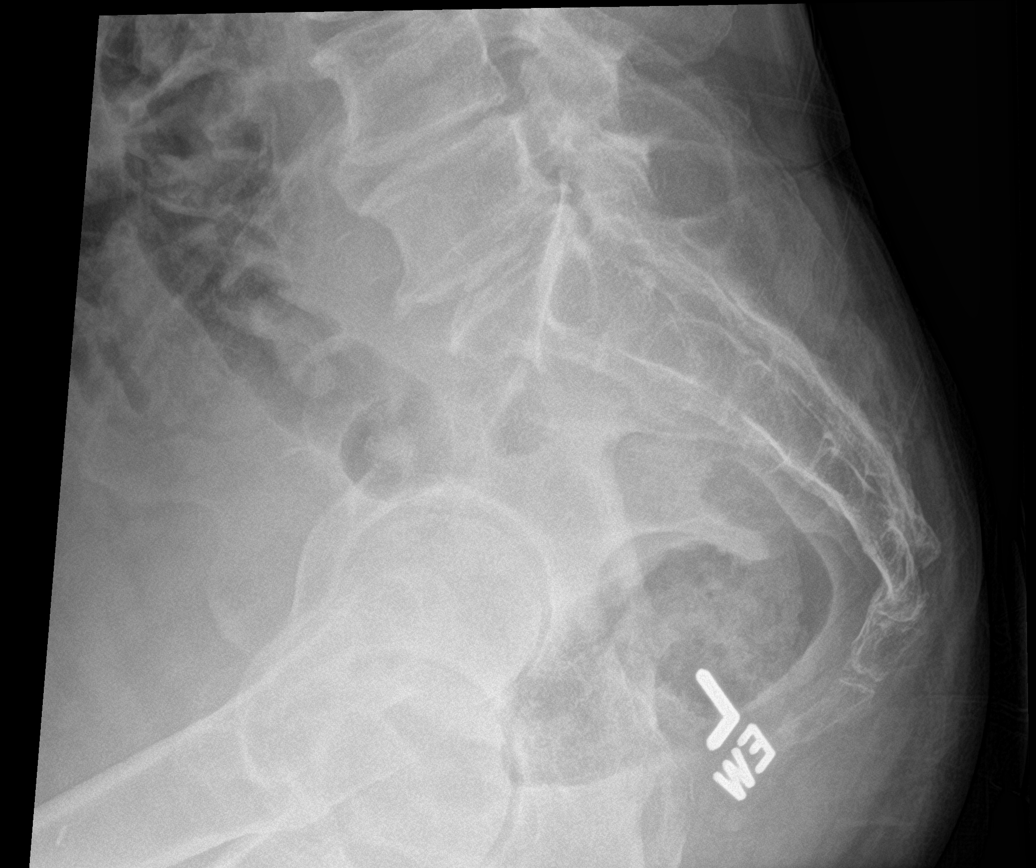

[l-spine obl (2 of 3)]
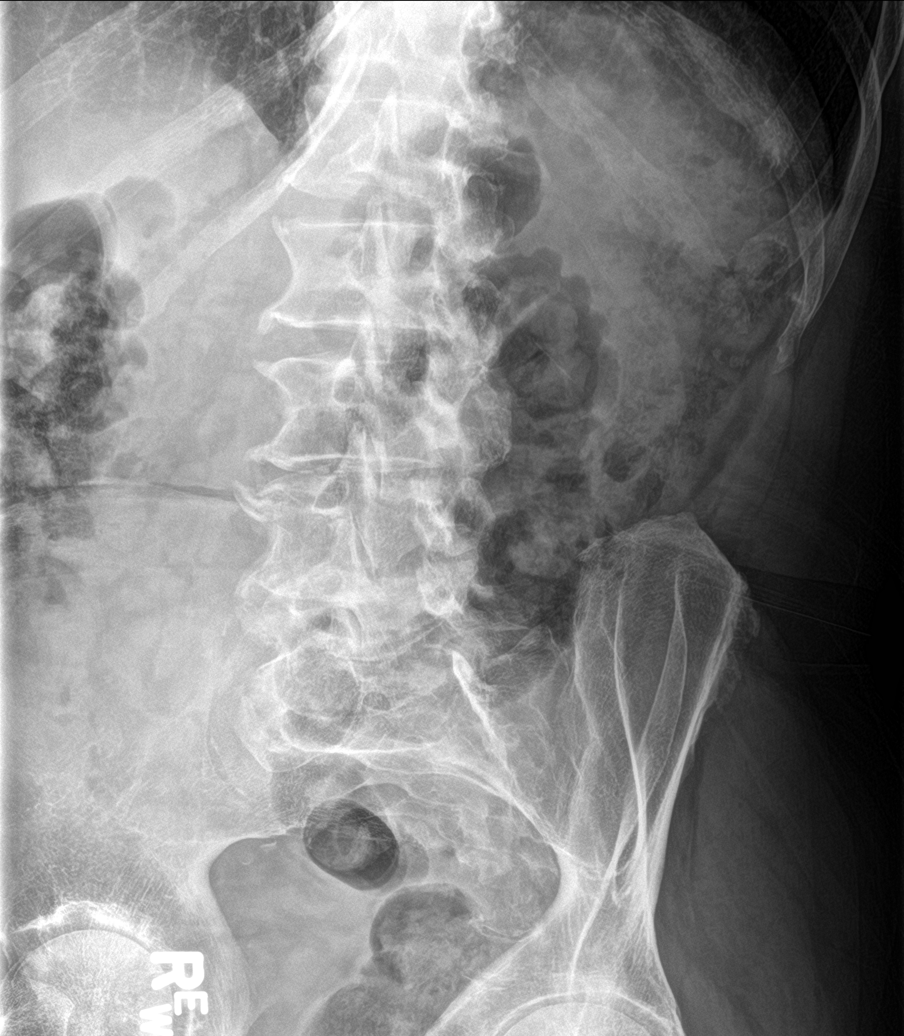

[l-spine obl (3 of 3)]
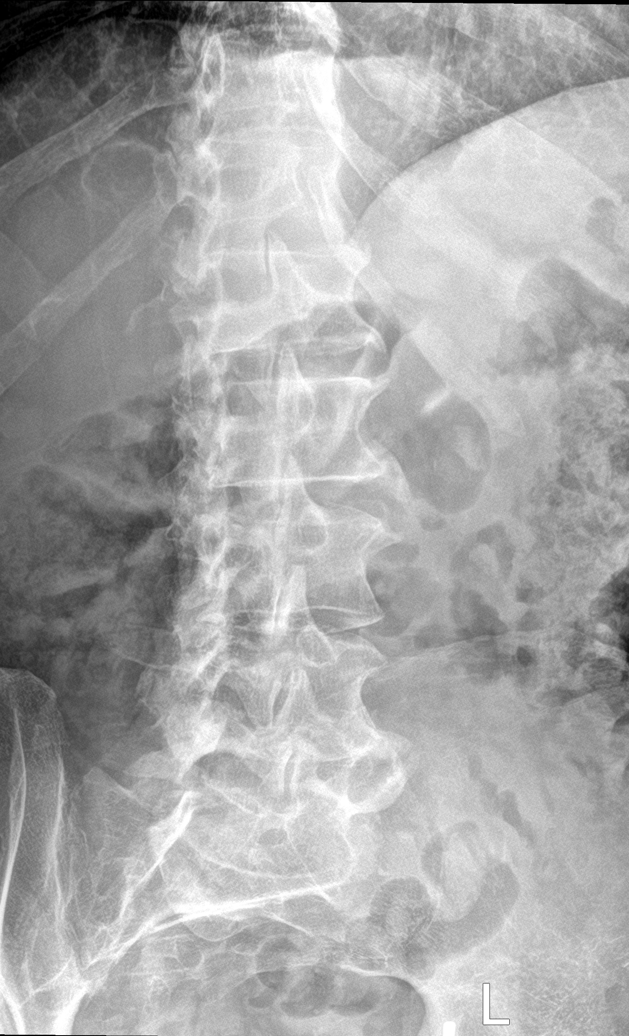

[6 of 6 positions shown; findings below may reference images not displayed]

FINDINGS: The lumbar vertebral bodies are preserved in height. The pedicles
and transverse processes are intact. There is moderate disc space
narrowing at L3-4, L4-5, and L5-S1. Small anterior and lateral
endplate osteophytes are present at multiple levels. There is no
spondylolisthesis. There is no significant facet joint hypertrophy.
The observed portions of the sacrum are normal.
IMPRESSION: Degenerative disc disease centered at L3-4, L4-5, and L5-S1.
Endplate spondylosis. No compression fracture nor other acute bony
abnormality.

## 2015-12-05 MED ORDER — TRAMADOL HCL 50 MG PO TABS: ORAL_TABLET | ORAL | 0 refills | Status: DC

## 2015-12-05 NOTE — Assessment & Plan Note (Addendum)
Hemoccult A1c is well controlled, has even come down to 5.7, continue Actos 30 mg daily. Recheck A1c in 3 months.

## 2015-12-05 NOTE — Addendum Note (Signed)
Addended by: Elizabeth Sauer on: 12/05/2015 02:28 PM   Modules accepted: Orders

## 2015-12-05 NOTE — Assessment & Plan Note (Signed)
Hypertension is well controlled, no changes.

## 2015-12-05 NOTE — Progress Notes (Signed)
  Subjective:    CC: Increased blood sugar  HPI: This is a pleasant 75 year old male, he is a history of diabetes mellitus type 2, well-controlled on Actos 30 mg. He is noted some blood sugars into the 200s, it is here for further evaluation. No polyuria, polydipsia, or polyphagia.  Gout: Due for uric acid levels  Hypertension: Moderately controlled  Low back pain: Moderate, persistent, localized in the left side of the low back worse with sitting, flexion, Valsalva, nothing radicular, no bowel or bladder dysfunction, saddle numbness, constitutional symptoms.  Past medical history:  Negative.  See flowsheet/record as well for more information.  Surgical history: Negative.  See flowsheet/record as well for more information.  Family history: Negative.  See flowsheet/record as well for more information.  Social history: Negative.  See flowsheet/record as well for more information.  Allergies, and medications have been entered into the medical record, reviewed, and no changes needed.   Review of Systems: No fevers, chills, night sweats, weight loss, chest pain, or shortness of breath.   Objective:    General: Well Developed, well nourished, and in no acute distress.  Neuro: Alert and oriented x3, extra-ocular muscles intact, sensation grossly intact.  HEENT: Normocephalic, atraumatic, pupils equal round reactive to light, neck supple, no masses, no lymphadenopathy, thyroid nonpalpable.  Skin: Warm and dry, no rashes. Cardiac: Regular rate and rhythm, no murmurs rubs or gallops, no lower extremity edema.  Respiratory: Clear to auscultation bilaterally. Not using accessory muscles, speaking in full sentences. Back Exam:  Inspection: Unremarkable  Motion: Flexion 45 deg, Extension 45 deg, Side Bending to 45 deg bilaterally,  Rotation to 45 deg bilaterally  SLR laying: Negative  XSLR laying: Negative  Palpable tenderness: None. FABER: negative. Sensory change: Gross sensation intact to  all lumbar and sacral dermatomes.  Reflexes: 2+ at both patellar tendons, 2+ at achilles tendons, Babinski's downgoing.  Strength at foot  Plantar-flexion: 5/5 Dorsi-flexion: 5/5 Eversion: 5/5 Inversion: 5/5  Leg strength  Quad: 5/5 Hamstring: 5/5 Hip flexor: 5/5 Hip abductors: 5/5  Gait unremarkable.  Impression and Recommendations:    Gout Rechecking uric acid levels  Diabetes mellitus type 2 in nonobese Hemoccult A1c is well controlled, has even come down to 5.7, continue Actos 30 mg daily. Recheck A1c in 3 months.  Essential hypertension, benign Hypertension is well controlled, no changes.  Lumbar degenerative disc disease Formal physical therapy, tramadol, x-rays.  Return in one month, MRI for interventional planning if no better.

## 2015-12-05 NOTE — Assessment & Plan Note (Signed)
Rechecking uric acid levels

## 2015-12-05 NOTE — Assessment & Plan Note (Signed)
Formal physical therapy, tramadol, x-rays.  Return in one month, MRI for interventional planning if no better.

## 2015-12-06 LAB — COMPREHENSIVE METABOLIC PANEL
ALT: 9 U/L (ref 9–46)
AST: 22 U/L (ref 10–35)
Albumin: 3.9 g/dL (ref 3.6–5.1)
Alkaline Phosphatase: 65 U/L (ref 40–115)
Calcium: 9.2 mg/dL (ref 8.6–10.3)
Chloride: 109 mmol/L (ref 98–110)
Creat: 4.67 mg/dL — ABNORMAL HIGH (ref 0.70–1.18)
Sodium: 136 mmol/L (ref 135–146)
Total Bilirubin: 0.5 mg/dL (ref 0.2–1.2)
Total Protein: 6.5 g/dL (ref 6.1–8.1)

## 2015-12-06 LAB — LIPID PANEL
Cholesterol: 148 mg/dL (ref 125–200)
HDL: 70 mg/dL (ref >=40)
LDL Cholesterol: 69 mg/dL (ref <130)
Total CHOL/HDL Ratio: 2.1 ratio (ref <=5.0)
Triglycerides: 46 mg/dL (ref <150)
VLDL: 9 mg/dL (ref <30)

## 2015-12-06 LAB — CBC
HCT: 28.4 % — ABNORMAL LOW (ref 38.5–50.0)
Hemoglobin: 9.1 g/dL — ABNORMAL LOW (ref 13.2–17.1)
MCH: 24.1 pg — ABNORMAL LOW (ref 27.0–33.0)
MCHC: 32 g/dL (ref 32.0–36.0)
MCV: 75.1 fL — ABNORMAL LOW (ref 80.0–100.0)
Platelets: 204 K/uL (ref 140–400)
RBC: 3.78 MIL/uL — ABNORMAL LOW (ref 4.20–5.80)
RDW: 20.3 % — ABNORMAL HIGH (ref 11.0–15.0)
WBC: 5.7 K/uL (ref 3.8–10.8)

## 2015-12-06 LAB — COMPREHENSIVE METABOLIC PANEL WITH GFR
BUN: 55 mg/dL — ABNORMAL HIGH (ref 7–25)
CO2: 19 mmol/L — ABNORMAL LOW (ref 20–31)
Glucose, Bld: 121 mg/dL — ABNORMAL HIGH (ref 65–99)
Potassium: 5.2 mmol/L (ref 3.5–5.3)

## 2015-12-06 LAB — URIC ACID: Uric Acid, Serum: 2.8 mg/dL — ABNORMAL LOW (ref 4.0–8.0)

## 2015-12-12 ENCOUNTER — Ambulatory Visit (INDEPENDENT_AMBULATORY_CARE_PROVIDER_SITE_OTHER): Payer: Medicare Other | Admitting: Rehabilitative and Restorative Service Providers"

## 2015-12-12 DIAGNOSIS — R29898 Other symptoms and signs involving the musculoskeletal system: Secondary | ICD-10-CM | POA: Diagnosis not present

## 2015-12-12 DIAGNOSIS — M545 Low back pain, unspecified: Secondary | ICD-10-CM

## 2015-12-12 NOTE — Patient Instructions (Signed)
Trunk: Prone Extension (Press-Ups)    Lie on stomach on firm, flat surface. Relax bottom and legs. Raise chest in air with elbows straight. Keep hips flat on surface, sag stomach. Hold __2-3__ seconds. Repeat __10__ times. Do _2-3___ sessions per day. CAUTION: Movement should be gentle and slow.   Piriformis Stretch   Lying on back, pull right knee toward opposite shoulder. Hold 30 seconds. Repeat 3 times. Do 2-3 sessions per day.   Quads / HF, Supine   Lie near edge of bed, pull both knees up toward chest. Hold one knee as you drop the other leg off the edge of the bed.  Relax hanging knee/can bend knee back if indicated. Hold 30 seconds. Repeat 3 times per session. Do 2-3 sessions per day.    Quads / HF, Prone   Lie face down. Grasp one ankle with same-side hand. Use towel if needed to reach. Gently pull foot toward buttock.  Hold 30 seconds. Repeat 3 times per session. Do 2-3 sessions per day.   Sleeping on Back  Place pillow under knees. A pillow with cervical support and a roll around waist are also helpful. Copyright  VHI. All rights reserved.  Sleeping on Side Place pillow between knees. Use cervical support under neck and a roll around waist as needed. Copyright  VHI. All rights reserved.   Sleeping on Stomach   If this is the only desirable sleeping position, place pillow under lower legs, and under stomach or chest as needed.  Posture - Sitting   Sit upright, head facing forward. Try using a roll to support lower back. Keep shoulders relaxed, and avoid rounded back. Keep hips level with knees. Avoid crossing legs for long periods. Stand to Sit / Sit to Stand   To sit: Bend knees to lower self onto front edge of chair, then scoot back on seat. To stand: Reverse sequence by placing one foot forward, and scoot to front of seat. Use rocking motion to stand up.   Work Height and Reach  Ideal work height is no more than 2 to 4 inches below elbow level when  standing, and at elbow level when sitting. Reaching should be limited to arm's length, with elbows slightly bent.  Bending  Bend at hips and knees, not back. Keep feet shoulder-width apart.    Posture - Standing   Good posture is important. Avoid slouching and forward head thrust. Maintain curve in low back and align ears over shoul- ders, hips over ankles.  Alternating Positions   Alternate tasks and change positions frequently to reduce fatigue and muscle tension. Take rest breaks. Computer Work   Position work to Programmer, multimedia. Use proper work and seat height. Keep shoulders back and down, wrists straight, and elbows at right angles. Use chair that provides full back support. Add footrest and lumbar roll as needed.  Getting Into / Out of Car  Lower self onto seat, scoot back, then bring in one leg at a time. Reverse sequence to get out.  Dressing  Lie on back to pull socks or slacks over feet, or sit and bend leg while keeping back straight.    Housework - Sink  Place one foot on ledge of cabinet under sink when standing at sink for prolonged periods.   Pushing / Pulling  Pushing is preferable to pulling. Keep back in proper alignment, and use leg muscles to do the work.  Deep Squat   Squat and lift with both arms held against upper trunk.  Tighten stomach muscles without holding breath. Use smooth movements to avoid jerking.  Avoid Twisting   Avoid twisting or bending back. Pivot around using foot movements, and bend at knees if needed when reaching for articles.  Carrying Luggage   Distribute weight evenly on both sides. Use a cart whenever possible. Do not twist trunk. Move body as a unit.   Lifting Principles .Maintain proper posture and head alignment. .Slide object as close as possible before lifting. .Move obstacles out of the way. .Test before lifting; ask for help if too heavy. .Tighten stomach muscles without holding breath. .Use smooth  movements; do not jerk. .Use legs to do the work, and pivot with feet. .Distribute the work load symmetrically and close to the center of trunk. .Push instead of pull whenever possible.   Ask For Help   Ask for help and delegate to others when possible. Coordinate your movements when lifting together, and maintain the low back curve.  Log Roll   Lying on back, bend left knee and place left arm across chest. Roll all in one movement to the right. Reverse to roll to the left. Always move as one unit. Housework - Sweeping  Use long-handled equipment to avoid stooping.   Housework - Wiping  Position yourself as close as possible to reach work surface. Avoid straining your back.  Laundry - Unloading Wash   To unload small items at bottom of washer, lift leg opposite to arm being used to reach.  [place] close to area to be raked. Use arm movements to do the work. Keep back straight and avoid twisting.     Cart  When reaching into cart with one arm, lift opposite leg to keep back straight.   Getting Into / Out of Bed  Lower self to lie down on one side by raising legs and lowering head at the same time. Use arms to assist moving without twisting. Bend both knees to roll onto back if desired. To sit up, start from lying on side, and use same move-ments in reverse. Housework - Vacuuming  Hold the vacuum with arm held at side. Step back and forth to move it, keeping head up. Avoid twisting.   Laundry - IT consultant so that bending and twisting can be avoided.   Laundry - Unloading Dryer  Squat down to reach into clothes dryer or use a reacher.  Gardening - Weeding / Probation officer or Kneel. Knee pads may be helpful.

## 2015-12-12 NOTE — Therapy (Signed)
[facility] [place], [place], [address] Phone: [phone]   Fax:  [phone]  Physical Therapy Evaluation  Patient Details  Name: Brian Rose MRN: [id number] Date of Birth: June 20, 1940 Referring Provider: Dr Dianah Field  Encounter Date: 12/12/2015      PT End of Session - 12/12/15 0846    Visit Number 1   Number of Visits 12   Date for PT Re-Evaluation 01/23/16   PT Start Time 0849   PT Stop Time 0945   PT Time Calculation (min) 56 min   Activity Tolerance Patient tolerated treatment well      Past Medical History:  Diagnosis Date  . Diabetes ([place])   . Hypertension     No past surgical history on file.  There were no vitals filed for this visit.       Subjective Assessment - 12/12/15 0850    Subjective Brian Rose reports that he has Lt LBP which started with beginning of September when he was painting in his house. He has had some intermittent pain since that time.    Pertinent History Intermittent LBP nothing serious    How long can you sit comfortably? about an hour    How long can you stand comfortably? an hour    How long can you walk comfortably? 20-30 min    Diagnostic tests xrays - DDD    Patient Stated Goals help back - to relieve the pain    Currently in Pain? No/denies   Pain Location Back   Pain Orientation Left;Lower   Pain Descriptors / Indicators Sharp   Pain Type Acute pain   Pain Onset More than a month ago   Pain Frequency Intermittent   Aggravating Factors  bending forward; sitting wrong; twisting   Pain Relieving Factors exercise; changing positions            [facility] PT Assessment - 12/12/15 0001      Assessment   Medical Diagnosis Lumbar DDD   Referring Provider Dr Dianah Field   Onset Date/Surgical Date 10/26/15   Hand Dominance Right   Next MD Visit 11/17   Prior Therapy none     Precautions   Precautions None     Balance Screen   Has the patient  fallen in the past 6 months No   Has the patient had a decrease in activity level because of a fear of falling?  No   Is the patient reluctant to leave their home because of a fear of falling?  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 Standing -      AROM   Overall AROM Comments LE ROM - tight end range hip extension at neutral; hip flexion and rotation    Lumbar Flexion 80%   Lumbar Extension 50%   Lumbar - Right Side Bend 70%   Lumbar - Left Side Bend 70%   Lumbar - Right Rotation 60%  Lumbar - Left Rotation 60%     Strength   Overall Strength Comments 5/5 bilat LE      Flexibility   Hamstrings ~85 deg    Quadriceps 95 deg    ITB tight Lt > Rt    Piriformis tight Lt > Rt      Palpation   Spinal mobility WFL's with CPA and UPA mobs   Palpation comment tight Lt piriformis/hip abductors     Special Tests    Special Tests --  (+) Thomas test; (-) SLR                    [facility] Adult PT Treatment/Exercise - 12/12/15 0001      Self-Care   Self-Care --  initiated back education - sitting posture education      Lumbar Exercises: Stretches   Hip Flexor Stretch 3 reps;30 seconds   Press Ups --  2-3 sec x 10    Quad Stretch 3 reps;30 seconds   Piriformis Stretch 3 reps;30 seconds     Lumbar Exercises: Supine   Ab Set --  3 part core 10 sec x 10                 PT Education - 12/12/15 2595    Education provided Yes   Education Details HEP; posture and body mechanics     Person(s) Educated Patient   Methods Explanation;Demonstration;Tactile cues;Verbal cues;Handout   Comprehension Verbalized understanding;Returned demonstration;Verbal cues required;Tactile cues required             PT Long Term Goals - 12/12/15 1211      PT LONG TERM GOAL #1   Title Improve sitting posture with patient to demonstrate upright posture, avoiding slumped posture in chair - observed in clinic and reported for home and work 01/23/16   Time 6   Period Weeks     PT LONG TERM GOAL #2   Title Improve tissue extensibility and ROM through the lumbar spine in extension and bilat hips 01/23/16   Time 6   Period Weeks   Status New     PT LONG TERM GOAL #3   Title Decresae frezuency and intensity of Lt LB pain 01/23/16   Time 6   Period Weeks   Status New     PT LONG TERM GOAL #4   Title Independent in HEP 01/23/16   Time 6   Period Weeks   Status New     PT LONG TERM GOAL #5   Title Improve FOTO to 25% limitation 01/23/16   Time 6   Period Weeks   Status New               Plan - 12/12/15 1207    Clinical Impression Statement Brian Rose presents with diagnosis of Lumbar DDD. He has poor posture and alignment, especially in sitting. Patient has decreased ROM and tissue extensibility through bilat hips, Lt > Rt and through lumbar spine, especially in extension. Patient has muscular tightness and tenderness to palpation through the Lt piriformis/hip abductors. He has pain on an intermittent basis and limited functional activity level.    Rehab Potential Good   PT Frequency 2x / week   PT Duration 6 weeks   PT Treatment/Interventions Patient/family education;ADLs/Self Care Home Management;Cryotherapy;Electrical Stimulation;Iontophoresis 4mg /ml Dexamethasone;Moist Heat;Traction;Ultrasound;Dry needling;Manual techniques;Therapeutic activities;Therapeutic exercise   PT Next Visit Plan assess response to stretching; progress core stabilization with initial home program  for core; manual work v TDN to Lt hip;  modalities if indicated(none for initial visit)   Consulted and Agree with Plan of Care Patient      Patient will benefit from skilled therapeutic intervention in order to improve the following deficits and impairments:  Postural dysfunction, Improper body mechanics, Pain, Decreased range of motion, Decreased mobility, Increased fascial restricitons, Increased muscle spasms, Decreased activity tolerance  Visit Diagnosis: Acute left-sided low back pain without sciatica - Plan: PT plan of care cert/re-cert  Other symptoms and signs involving the musculoskeletal system - Plan: PT plan of care cert/re-cert     Problem List Patient Active Problem List   Diagnosis Date Noted  . Lumbar degenerative disc disease 12/05/2015  . Benign essential tremor 09/12/2015  . 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 Microcytic anemia 09/08/2013  . Gout 09/06/2013  . Left knee pain 09/06/2013  . Diabetes mellitus type 2 in nonobese ([place]) 09/06/2013  . Essential hypertension, benign 09/06/2013  . Preventive measure 09/06/2013  . Chronic renal insufficiency 09/06/2013    Elasha Tess Nilda Simmer PT, MPH  12/12/2015, 12:20 PM  [facility] [place], [place], [address] Phone: [phone]   Fax:  [phone]  Name: Brian Rose MRN: [id number] Date of Birth: 1940-11-03

## 2015-12-17 ENCOUNTER — Encounter: Payer: Self-pay | Admitting: Rehabilitative and Restorative Service Providers"

## 2015-12-17 ENCOUNTER — Ambulatory Visit (INDEPENDENT_AMBULATORY_CARE_PROVIDER_SITE_OTHER): Payer: Medicare Other | Admitting: Rehabilitative and Restorative Service Providers"

## 2015-12-17 DIAGNOSIS — R29898 Other symptoms and signs involving the musculoskeletal system: Secondary | ICD-10-CM | POA: Diagnosis not present

## 2015-12-17 DIAGNOSIS — M545 Low back pain, unspecified: Secondary | ICD-10-CM

## 2015-12-17 NOTE — Therapy (Signed)
[place], [place], [address] Phone: [phone]   Fax:  [phone]  Physical Therapy Treatment  Patient Details  Name: Brian Rose MRN: [id number] Date of Birth: 09/18/1940 Referring Provider: Dr Dianah Field  Encounter Date: 12/17/2015      PT End of Session - 12/17/15 0806    Visit Number 2   Number of Visits 12   Date for PT Re-Evaluation 01/23/16   PT Start Time 0804   PT Stop Time 0900   PT Time Calculation (min) 56 min   Activity Tolerance Patient tolerated treatment well      Past Medical History:  Diagnosis Date  . Diabetes ([place])   . Hypertension     History reviewed. No pertinent surgical history.  There were no vitals filed for this visit.      Subjective Assessment - 12/17/15 0810    Subjective Not much chance to work on the exercises since he was in therapy. Drove to [place] for his daughter's performance in ballet production.    Currently in Pain? No/denies                         [facility] Adult PT Treatment/Exercise - 12/17/15 0001      Therapeutic Activites    Therapeutic Activities --  myofacial ball release work      Lumbar Exercises: Stretches   Hip Flexor Stretch 3 reps;30 seconds   Press Ups --  2-3 sec x 10    Piriformis Stretch 3 reps;30 seconds     Lumbar Exercises: Supine   Ab Set --  3 part core 10 sec x 10    AB Set Limitations continued verbal and tactile cues for technique   Clam 10 reps  repeat with green band x 10      Lumbar Exercises: Sidelying   Clam 10 reps     Lumbar Exercises: Quadruped   Madcat/Old Horse 10 reps   Madcat/Old Horse Limitations difficulty with technique for cat cow      Moist Heat Therapy   Number Minutes Moist Heat 15 Minutes   Moist Heat Location Lumbar Spine;Hip     Manual Therapy   Manual therapy comments pt prone    Joint Mobilization lt hip PA mobs    Soft tissue mobilization deep tissue  and trigger point work through Lt piriformis and gluts    Passive ROM Lt LE IR/ER in prone w/ knee flexed                 PT Education - 12/17/15 0815    Education provided Yes   Education Details HEP    Person(s) Educated Patient   Methods Explanation;Demonstration;Tactile cues;Verbal cues;Handout   Comprehension Verbalized understanding;Returned demonstration;Verbal cues required;Tactile cues required             PT Long Term Goals - 12/17/15 0807      PT LONG TERM GOAL #1   Title Improve sitting posture with patient to demonstrate upright posture, avoiding slumped posture in chair - observed in clinic and reported for home and work 01/23/16   Time 6   Period Weeks   Status On-going     PT LONG TERM GOAL #2   Title Improve tissue extensibility and ROM through the lumbar spine in extension and bilat hips 01/23/16   Time 6   Period Weeks   Status On-going     PT LONG TERM GOAL #3  Title Decresae frezuency and intensity of Lt LB pain 01/23/16   Time 6   Period Weeks   Status On-going     PT LONG TERM GOAL #4   Title Independent in HEP 01/23/16   Time 6   Period Weeks   Status On-going     PT LONG TERM GOAL #5   Title Improve FOTO to 25% limitation 01/23/16   Time 6   Period Weeks   Status On-going               Plan - 12/17/15 0849    Clinical Impression Statement Limited lumbar mobility; tightness through the Lt piriformis and gluts. Good respose to manual work with decreased tightness noted. Difficulty with three part core but improving with practice. Progressing toward goals.    Rehab Potential Good   PT Frequency 2x / week   PT Duration 6 weeks   PT Treatment/Interventions Patient/family education;ADLs/Self Care Home Management;Cryotherapy;Electrical Stimulation;Iontophoresis 4mg /ml Dexamethasone;Moist Heat;Traction;Ultrasound;Dry needling;Manual techniques;Therapeutic activities;Therapeutic exercise   PT Next Visit Plan assess response  manual work through the Lt piriformis and gluts f/b stretching; progress core stabilization; continue manual work v TDN to Lt hip; modalities if indicated   Consulted and Agree with Plan of Care Patient      Patient will benefit from skilled therapeutic intervention in order to improve the following deficits and impairments:  Postural dysfunction, Improper body mechanics, Pain, Decreased range of motion, Decreased mobility, Increased fascial restricitons, Increased muscle spasms, Decreased activity tolerance  Visit Diagnosis: Acute left-sided low back pain without sciatica  Other symptoms and signs involving the musculoskeletal system     Problem List Patient Active Problem List   Diagnosis Date Noted  . Lumbar degenerative disc disease 12/05/2015  . Benign essential tremor 09/12/2015  . 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 Microcytic anemia 09/08/2013  . Gout 09/06/2013  . Left knee pain 09/06/2013  . Diabetes mellitus type 2 in nonobese ([place]) 09/06/2013  . Essential hypertension, benign 09/06/2013  . Preventive measure 09/06/2013  . Chronic renal insufficiency 09/06/2013    Celyn Nilda Simmer PT, MPH  12/17/2015, 9:08 AM  [facility] [place], [place], [address] Phone: [phone]   Fax:  [phone]  Name: Mycah Formica MRN: [id number] Date of Birth: Jan 03, 1941

## 2015-12-17 NOTE — Patient Instructions (Addendum)
Abdominal Bracing With Pelvic Floor (Hook-Lying)    With neutral spine, tighten pelvic floor and abdominals suck belly to back bone; tighten muscles in low back at waist. Hold 10 sec  Repeat _10__ times. Do _several __ times a day. Progress to do this in sitting, standing and walking  Clam    Lie on side, legs bent 90. Open top knee to ceiling, rotating leg outward.  Close knees,  Maintain hip position. Repeat __10__ times 2 sets - can add green band for increased resistance. Repeat on other side. Do __1__ sessions per day.  Strengthening: Hip Abductor - Resisted    With band looped around both legs above knees, push thighs apart. Repeat _10___ times per set. Do _1-2___ sets per session. Do _1___ sessions per day. Green band

## 2015-12-21 ENCOUNTER — Ambulatory Visit (INDEPENDENT_AMBULATORY_CARE_PROVIDER_SITE_OTHER): Payer: Medicare Other | Admitting: Rehabilitative and Restorative Service Providers"

## 2015-12-21 ENCOUNTER — Encounter: Payer: Self-pay | Admitting: Rehabilitative and Restorative Service Providers"

## 2015-12-21 DIAGNOSIS — R29898 Other symptoms and signs involving the musculoskeletal system: Secondary | ICD-10-CM

## 2015-12-21 DIAGNOSIS — M545 Low back pain, unspecified: Secondary | ICD-10-CM

## 2015-12-21 NOTE — Therapy (Addendum)
[place], [place], [address] Phone: [phone]   Fax:  [phone]  Physical Therapy Treatment  Patient Details  Name: Brian Rose MRN: [id number] Date of Birth: 12/25/1940 Referring Provider: Dr Dianah Field  Encounter Date: 12/21/2015      PT End of Session - 12/21/15 0849    Visit Number 3   Number of Visits 12   Date for PT Re-Evaluation 01/23/16   PT Start Time 0102   PT Stop Time 0941   PT Time Calculation (min) 54 min   Activity Tolerance Patient tolerated treatment well      Past Medical History:  Diagnosis Date  . Diabetes ([place])   . Hypertension     History reviewed. No pertinent surgical history.  There were no vitals filed for this visit.      Subjective Assessment - 12/21/15 0850    Subjective Feeling better. Working on his exercises at home. Notices pain in the Lt LB when he bends over a few times. Good response to the manual work and exercises at last visit.    Currently in Pain? No/denies            [facility] PT Assessment - 12/21/15 0001      Assessment   Medical Diagnosis Lumbar DDD   Referring Provider Dr Dianah Field   Onset Date/Surgical Date 10/26/15   Hand Dominance Right   Next MD Visit 11/17   Prior Therapy none     Observation/Other Assessments   Focus on Therapeutic Outcomes (FOTO)  31% limitation      AROM   Overall AROM Comments LE ROM - tight end range hip extension at neutral; hip flexion and rotation    Lumbar Flexion 80%   Lumbar Extension 605   Lumbar - Right Side Bend 70%   Lumbar - Left Side Bend 70%   Lumbar - Right Rotation 60%   Lumbar - Left Rotation 60%     Strength   Overall Strength Comments 5/5 bilat LE      Flexibility   Hamstrings ~85 deg    Quadriceps 105 deg    ITB tight Lt > Rt    Piriformis tight Lt > Rt      Palpation   Palpation comment less tightness Lt piriformis/hip abductors                      [facility] Adult PT Treatment/Exercise - 12/21/15 0001      Lumbar Exercises: Stretches   Hip Flexor Stretch 3 reps;30 seconds   Press Ups --  2-3 sec x 10    Quad Stretch 3 reps;30 seconds   Piriformis Stretch 3 reps;30 seconds     Lumbar Exercises: Aerobic   Stationary Bike Nustep L5x 5 min      Lumbar Exercises: Standing   Wall Slides 5 reps  10-20 sec hold    Row Strengthening;Both;20 reps   Theraband Level (Row) Level 4 (Blue)   Shoulder Extension Strengthening;Both;20 reps   Theraband Level (Shoulder Extension) Level 4 (Blue)   Other Standing Lumbar Exercises step ups rt/lt 20 each 8 in step      Lumbar Exercises: Quadruped   Madcat/Old Horse 10 reps   Madcat/Old Horse Limitations improving technique     Moist Heat Therapy   Number Minutes Moist Heat 15 Minutes   Moist Heat Location Lumbar Spine;Hip     Manual Therapy   Manual therapy comments pt prone  Joint Mobilization lt hip PA mobs    Soft tissue mobilization deep tissue and trigger point work through [place] piriformis and gluts    Passive ROM Lt LE IR/ER in prone w/ knee flexed                 PT Education - 12/21/15 0905    Education provided Yes   Education Details HEP   Person(s) Educated Patient   Methods Explanation;Demonstration;Tactile cues;Verbal cues;Handout   Comprehension Verbalized understanding;Returned demonstration;Verbal cues required;Tactile cues required             PT Long Term Goals - 12/21/15 1435      PT LONG TERM GOAL #1   Title Improve sitting posture with patient to demonstrate upright posture, avoiding slumped posture in chair - observed in clinic and reported for home and work 01/23/16   Time 6   Period Weeks   Status Partially Met     PT LONG TERM GOAL #2   Title Improve tissue extensibility and ROM through the lumbar spine in extension and bilat hips 01/23/16   Time 6   Period Weeks   Status Partially Met     PT LONG TERM GOAL  #3   Title Decresae frezuency and intensity of Lt LB pain 01/23/16   Time 6   Period Weeks   Status Achieved     PT LONG TERM GOAL #4   Title Independent in HEP 01/23/16   Time 6   Period Weeks   Status Achieved     PT LONG TERM GOAL #5   Title Improve FOTO to 25% limitation 01/23/16   Time 6   Period Weeks   Status Not Met               Plan - 12/21/15 0925    Clinical Impression Statement Inproving. Less pain and improved spinal mobility. Decreased tenderness to palpation through the Lt lumbar and hip areas. Some continued banding at piriformis/hip abductors at sacrum - but improving. Added stabilization exercises in standing. Encouraged a regular exercise routine. Progressing well.    Rehab Potential Good   PT Frequency 2x / week   PT Duration 6 weeks   PT Treatment/Interventions Patient/family education;ADLs/Self Care Home Management;Cryotherapy;Electrical Stimulation;Iontophoresis 37m/ml Dexamethasone;Moist Heat;Traction;Ultrasound;Dry needling;Manual techniques;Therapeutic activities;Therapeutic exercise   PT Next Visit Plan continued manual work through the Lt piriformis and gluts as needed; progress core stabilization; modalities if indicated   Consulted and Agree with Plan of Care Patient      Patient will benefit from skilled therapeutic intervention in order to improve the following deficits and impairments:  Postural dysfunction, Improper body mechanics, Pain, Decreased range of motion, Decreased mobility, Increased fascial restricitons, Increased muscle spasms, Decreased activity tolerance  Visit Diagnosis: Acute left-sided low back pain without sciatica  Other symptoms and signs involving the musculoskeletal system     Problem List Patient Active Problem List   Diagnosis Date Noted  . Lumbar degenerative disc disease 12/05/2015  . Benign essential tremor 09/12/2015  . 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 Microcytic anemia 09/08/2013   . Gout 09/06/2013  . Left knee pain 09/06/2013  . Diabetes mellitus type 2 in nonobese ([place] 09/06/2013  . Essential hypertension, benign 09/06/2013  . Preventive measure 09/06/2013  . Chronic renal insufficiency 09/06/2013    Celyn PNilda SimmerPT, MPH  12/21/2015, 2:36 PM  [facility] [address]Phone: [phone]  Fax:  [phone]  Name: Araceli Coufal MRN: [id number] Date of Birth: April 18, 1940   PHYSICAL THERAPY DISCHARGE SUMMARY  Visits from Start of Care: 3   Current functional level related to goals / functional outcomes: Decreased pain; improved mobility through trunk and LE's; independent HEP   Remaining deficits: End range tightness - should continue with HEP    Education / Equipment: HEP Plan: Patient agrees to discharge.  Patient goals were partially met. Patient is being discharged due to being pleased with the current functional level.  ?????     Celyn P. Helene Kelp PT, MPH 12/21/15 2:36 PM

## 2015-12-21 NOTE — Patient Instructions (Signed)
Strengthening: Wall Slide    Leaning on wall, slowly lower buttocks until thighs are parallel to floor. Hold __20__ seconds. Tighten thigh muscles and return. Repeat __5-10__ times per set. Do __1-2__ sets per session. Do __1__ sessions per day.   Forward    Facing step, place one leg on step, flexed at hip. Step up slowly, bringing hips in line with knee and shoulder. Bring other foot onto step. Reverse process to step back down. Repeat with other leg. Do __20-30__ repetitions, __1-2__ sets.   Low Row: Standing   Face anchor, feet shoulder width apart. Palms up, pull arms back, squeezing shoulder blades together. Repeat 10__ times per set. Do 2-3__ sets per session. Do 2-3__ sessions per week. Anchor Height: Waist   Strengthening: Resisted Extension   Hold tubing in right hand, arm forward. Pull arm back, elbow straight. Repeat _10___ times per set. Do 2-3____ sets per session. Do 2-3____ sessions per day.

## 2015-12-24 ENCOUNTER — Encounter: Payer: Medicare Other | Admitting: Rehabilitative and Restorative Service Providers"

## 2015-12-27 ENCOUNTER — Encounter: Payer: Medicare Other | Admitting: Physical Therapy

## 2015-12-28 ENCOUNTER — Telehealth: Payer: Self-pay | Admitting: *Deleted

## 2015-12-28 NOTE — Telephone Encounter (Signed)
Received request for gen actos. I'm sorry but once again this is a historical med so provider has to approve and send initially.thank you

## 2015-12-29 MED ORDER — PIOGLITAZONE HCL 30 MG PO TABS: 30.0000 mg | ORAL_TABLET | Freq: Every day | ORAL | 1 refills | Status: DC

## 2015-12-29 NOTE — Telephone Encounter (Signed)
No prob, taken care of.

## 2016-01-11 ENCOUNTER — Ambulatory Visit (INDEPENDENT_AMBULATORY_CARE_PROVIDER_SITE_OTHER): Payer: Medicare Other | Admitting: Sports Medicine

## 2016-01-11 ENCOUNTER — Encounter: Payer: Self-pay | Admitting: Sports Medicine

## 2016-01-11 DIAGNOSIS — M5136 Other intervertebral disc degeneration, lumbar region: Secondary | ICD-10-CM | POA: Diagnosis not present

## 2016-01-11 DIAGNOSIS — M51369 Other intervertebral disc degeneration, lumbar region without mention of lumbar back pain or lower extremity pain: Secondary | ICD-10-CM

## 2016-01-11 NOTE — Progress Notes (Signed)
  Subjective:    CC: Follow-up  HPI: This is a pleasant 75 year old male, he comes in for follow-up of low back pain, x-ray showed multilevel degenerative disc disease, he did several sessions of physical therapy and has been somewhat nonchalant with home exercises. He does report good improvement in his symptoms.  Past medical history:  Negative.  See flowsheet/record as well for more information.  Surgical history: Negative.  See flowsheet/record as well for more information.  Family history: Negative.  See flowsheet/record as well for more information.  Social history: Negative.  See flowsheet/record as well for more information.  Allergies, and medications have been entered into the medical record, reviewed, and no changes needed.   Review of Systems: No fevers, chills, night sweats, weight loss, chest pain, or shortness of breath.   Objective:    General: Well Developed, well nourished, and in no acute distress.  Neuro: Alert and oriented x3, extra-ocular muscles intact, sensation grossly intact.  HEENT: Normocephalic, atraumatic, pupils equal round reactive to light, neck supple, no masses, no lymphadenopathy, thyroid nonpalpable.  Skin: Warm and dry, no rashes. Cardiac: Regular rate and rhythm, no murmurs rubs or gallops, no lower extremity edema.  Respiratory: Clear to auscultation bilaterally. Not using accessory muscles, speaking in full sentences.  Impression and Recommendations:    Lumbar degenerative disc disease Multilevel degenerative changes that have essentially resolved with physical therapy. Return to see me on an as-needed basis. I did advise any continue rehabilitation exercises at least every 2 or 3 days from here on out.

## 2016-01-11 NOTE — Assessment & Plan Note (Signed)
Multilevel degenerative changes that have essentially resolved with physical therapy. Return to see me on an as-needed basis. I did advise any continue rehabilitation exercises at least every 2 or 3 days from here on out.

## 2016-02-06 DIAGNOSIS — N185 Chronic kidney disease, stage 5: Secondary | ICD-10-CM | POA: Diagnosis not present

## 2016-02-08 ENCOUNTER — Encounter: Payer: Self-pay | Admitting: Sports Medicine

## 2016-02-08 ENCOUNTER — Other Ambulatory Visit: Payer: Self-pay

## 2016-02-08 DIAGNOSIS — I10 Essential (primary) hypertension: Secondary | ICD-10-CM

## 2016-02-08 MED ORDER — AMLODIPINE BESY-BENAZEPRIL HCL 10-20 MG PO CAPS: 1.0000 | ORAL_CAPSULE | Freq: Every day | ORAL | 3 refills | Status: DC

## 2016-03-15 ENCOUNTER — Telehealth: Payer: Self-pay | Admitting: Sports Medicine

## 2016-03-15 NOTE — Telephone Encounter (Signed)
LM for pt to schedule AWV with [facility] (Carla) on 2/8 at 10am, and change CPE at 10:45 to an office visit at same time. mn

## 2016-03-18 DIAGNOSIS — E119 Type 2 diabetes mellitus without complications: Secondary | ICD-10-CM | POA: Diagnosis not present

## 2016-03-18 DIAGNOSIS — H40003 Preglaucoma, unspecified, bilateral: Secondary | ICD-10-CM | POA: Diagnosis not present

## 2016-03-18 DIAGNOSIS — H25013 Cortical age-related cataract, bilateral: Secondary | ICD-10-CM | POA: Diagnosis not present

## 2016-03-18 LAB — HM DIABETES EYE EXAM

## 2016-04-03 ENCOUNTER — Encounter: Payer: Self-pay | Admitting: Sports Medicine

## 2016-04-03 ENCOUNTER — Ambulatory Visit (INDEPENDENT_AMBULATORY_CARE_PROVIDER_SITE_OTHER): Payer: Medicare Other | Admitting: Sports Medicine

## 2016-04-03 ENCOUNTER — Ambulatory Visit: Payer: Medicare Other

## 2016-04-03 VITALS — BP 130/68 | HR 78 | Resp 18 | Ht 70.0 in | Wt 168.9 lb

## 2016-04-03 VITALS — BP 130/68 | HR 74 | Temp 98.0°F | Ht 70.0 in | Wt 168.9 lb

## 2016-04-03 DIAGNOSIS — R011 Cardiac murmur, unspecified: Secondary | ICD-10-CM

## 2016-04-03 DIAGNOSIS — I10 Essential (primary) hypertension: Secondary | ICD-10-CM

## 2016-04-03 DIAGNOSIS — Z0000 Encounter for general adult medical examination without abnormal findings: Secondary | ICD-10-CM

## 2016-04-03 DIAGNOSIS — Z23 Encounter for immunization: Secondary | ICD-10-CM | POA: Diagnosis not present

## 2016-04-03 DIAGNOSIS — E119 Type 2 diabetes mellitus without complications: Secondary | ICD-10-CM | POA: Diagnosis not present

## 2016-04-03 DIAGNOSIS — E538 Deficiency of other specified B group vitamins: Secondary | ICD-10-CM | POA: Diagnosis not present

## 2016-04-03 DIAGNOSIS — D509 Iron deficiency anemia, unspecified: Secondary | ICD-10-CM

## 2016-04-03 DIAGNOSIS — Z1321 Encounter for screening for nutritional disorder: Secondary | ICD-10-CM | POA: Diagnosis not present

## 2016-04-03 DIAGNOSIS — I701 Atherosclerosis of renal artery: Secondary | ICD-10-CM | POA: Diagnosis not present

## 2016-04-03 DIAGNOSIS — N189 Chronic kidney disease, unspecified: Secondary | ICD-10-CM | POA: Diagnosis not present

## 2016-04-03 LAB — FERRITIN: Ferritin: 74 ng/mL (ref 20–380)

## 2016-04-03 LAB — CBC
HCT: 31.9 % — ABNORMAL LOW (ref 38.5–50.0)
Hemoglobin: 10 g/dL — ABNORMAL LOW (ref 13.2–17.1)
MCH: 24.6 pg — ABNORMAL LOW (ref 27.0–33.0)
MCHC: 31.3 g/dL — ABNORMAL LOW (ref 32.0–36.0)
MCV: 78.6 fL — ABNORMAL LOW (ref 80.0–100.0)
Platelets: 190 K/uL (ref 140–400)
RBC: 4.06 MIL/uL — ABNORMAL LOW (ref 4.20–5.80)
RDW: 19.6 % — ABNORMAL HIGH (ref 11.0–15.0)
WBC: 5.8 10*3/uL (ref 3.8–10.8)

## 2016-04-03 LAB — IRON AND TIBC
%SAT: 17 % (ref 15–60)
Iron: 64 ug/dL (ref 50–180)
TIBC: 384 ug/dL (ref 250–425)
UIBC: 320 ug/dL (ref 125–400)

## 2016-04-03 LAB — FOLATE: Folate: 21.7 ng/mL (ref >5.4)

## 2016-04-03 LAB — VITAMIN B12: Vitamin B-12: >2000 pg/mL — ABNORMAL HIGH (ref 200–1100)

## 2016-04-03 MED ORDER — CARVEDILOL 12.5 MG PO TABS: 12.5000 mg | ORAL_TABLET | Freq: Two times a day (BID) | ORAL | 11 refills | Status: DC

## 2016-04-03 NOTE — Assessment & Plan Note (Addendum)
Medicare physical as above. Checking advanced lipid panel per patient request. Up-to-date on pneumococcal vaccine 13, adding the 23 today. Return in one year.

## 2016-04-03 NOTE — Assessment & Plan Note (Signed)
Suspect aortic stenosis murmur.  He does have an echocardiogram coming up as part of a study that he is participating in.

## 2016-04-03 NOTE — Patient Instructions (Addendum)
Health maintenance:Pneumonia vaccine.  Diabetic eye exam.  Diabetic foot exam.   Abnormal screenings: None.   Patient concerns: None mentioned.   Nurse concerns: Pneumonia vaccine part 2. Discussed compromised immunity due to DM.   Next PCP appt: As advised by the PCP after his visit with him today.  Follow up one year for AWV-S. Diabetic Nephropathy Diabetic nephropathy is kidney disease that is caused by diabetes. Kidneys are the organs that filter and clean your blood. Kidneys also get rid of body waste products and extra fluid. Diabetes can cause gradual kidney damage over many years. Diabetic nephropathy that continues to get worse can lead to kidney failure. What are the causes? This condition is caused by kidney damage from diabetes. What increases the risk? This condition is more likely to develop in people with diabetes who also have:  High blood pressure.  High blood glucose.  A family history of diabetic nephropathy.  A history of diabetes for many years.  A history of tobacco use.  Certain inherited genes. What are the signs or symptoms? You may already have this condition before symptoms develop. Symptoms may include:  Swelling of your hands, feet, or ankles.  Weakness.  Poor appetite.  Nausea.  Confusion.  Fatigue.  Trouble sleeping. If nephropathy leads to kidney failure, symptoms may include:  Vomiting.  Shortness of breath.  Seizures.  Coma. How is this diagnosed? Usually, your health care provider can diagnose diabetic nephropathy from your symptoms and medical history. Your health care provider will also do a physical exam. It is important to diagnose this condition before symptoms develop so you may also have screening tests to look for any early signs of problems. These tests may include:  Yearly (annual) urine tests.  Urine collection over a 24-hour period to measure kidney function.  Blood tests that measure blood glucose levels  and kidney function. Problems other than diabetes can damage kidneys. If screening tests show early kidney damage, but your health care provider suspects that it is caused by a different problem, you may have other tests, such as:  An ultrasound of your kidneys.  A procedure to take a sample of kidney tissue for testing (biopsy). How is this treated? The goal of treatment is to prevent or slow down damage to your kidneys by managing your diabetes. To do this, it is important to control:  Your blood pressure. Your target blood pressure will be based on your age, the medicines you take, how long you have had diabetes, and any other medical conditions you have. Generally, the goal is to keep your blood pressure below 140/90. Medicines called ACE inhibitors may be used along with a water pill (diuretic) to help you control your blood pressure.  Your A1c (hemoglobin A1c, or HbA1c) level. This is a measurement of your average blood glucose level during the previous 2-3 months. You and your health care provider should work to keep this number under 7.  Your blood lipids. If you have high cholesterol, you may need to take lipid-lowering drugs, such as statins. Other treatments may include:  Medicines, including insulin injections.  Lifestyle changes, such as:  Losing weight.  Quitting smoking (smoking cessation).  A diet, which may include:  Restrictions of salt (sodium), protein, and fluid intake.  Vitamin D supplements. If your disease progresses to end-stage kidney failure, treatment may include:  Dialysis. This is a procedure to filter your blood with a machine.  Kidney transplant. Follow these instructions at home:  Take over-the-counter  and prescription medicines only as told by your health care provider.  Follow instructions from your health care provider about eating or drinking restrictions.  Do not use tobacco products, including cigarettes, chewing tobacco, and  e-cigarettes. If you need help quitting, ask your health care provider.  Be physically active every day. Ask your health care provider what type of exercise is best for you.  Eat healthy foods, and eat healthy snacks between meals. Have 3 meals and 3 snacks each day.  Maintain a healthy weight.  Keep all follow-up visits as told by your health care provider. This is important. Contact a health care provider if:  You have trouble keeping your blood glucose in your goal range.  Your hands, feet, or ankles are swollen.  You feel weak, tired, or dizzy.  You have muscle spasms.  You have nausea or vomiting.  You feel tired all the time. Get help right away if:  You are very sleepy.  You have:  A seizure or convulsion.  Severe, painful muscle spasms.  Shortness of breath.  Chest pain.  You pass out. This information is not intended to replace advice given to you by your health care provider. Make sure you discuss any questions you have with your health care provider. Document Released: 03/02/2007 Document Revised: 07/19/2015 Document Reviewed: 10/02/2014 Elsevier Interactive Patient Education  2017 [employer].

## 2016-04-03 NOTE — Progress Notes (Unsigned)
Subjective:   Brian Rose is a 76 y.o. male who presents for an Initial Medicare Annual Wellness Visit.  Review of Systems  Cardiac Risk Factors include: advanced age (>90mn, >>68women);diabetes mellitus;hypertension;male gender    Objective:    Today's Vitals   04/03/16 1002  BP: 130/68  Pulse: 74  Temp: 98 F (36.7 C)  TempSrc: Oral  SpO2: 98%  Weight: 168 lb 14.4 oz (76.6 kg)  Height: _0  (1.778 m)   Body mass index is 24.23 kg/m.  Current Medications (verified) Outpatient Encounter Prescriptions as of 04/03/2016  Medication Sig  . allopurinol (ZYLOPRIM) 300 MG tablet Take 300 mg by mouth daily.  .Marland KitchenamLODipine-benazepril (LOTREL) 10-20 MG capsule Take 1 capsule by mouth daily.  .Marland Kitchenaspirin 81 MG tablet Take 81 mg by mouth 2 (two) times daily.  . Blood Glucose Monitoring Suppl (BAYER CONTOUR MONITOR) W/DEVICE KIT Use as needed.  . Cyanocobalamin (B-12) 1000 MCG CAPS Take by mouth.  . fluticasone (FLONASE) 50 MCG/ACT nasal spray One spray in each nostril twice a day, use left hand for right nostril, and right hand for left nostril.  . Multiple Vitamins-Minerals (MULTIVITAMIN PO) Take by mouth.  . Niacin (VITAMIN B-3 PO) Take by mouth.  . Omega-3 Fatty Acids (FISH OIL) 1000 MG CAPS Take by mouth.  . pioglitazone (ACTOS) 30 MG tablet Take 1 tablet (30 mg total) by mouth daily.  .Marland Kitchenpyridoxine (B-6) 100 MG tablet Take 100 mg by mouth daily.  . [DISCONTINUED] carvedilol (COREG) 25 MG tablet Take 0.5 tablets (12.5 mg total) by mouth 2 (two) times daily with a meal.  . traMADol (ULTRAM) 50 MG tablet 1-2 tabs by mouth Q8 hours, maximum 6 tabs per day. (Patient not taking: Reported on 04/03/2016)   No facility-administered encounter medications on file as of 04/03/2016.     Allergies (verified) Patient has no known allergies.   History: Past Medical History:  Diagnosis Date  . Chronic kidney disease   . [place]   . Hypertension    History reviewed. No pertinent  surgical history. Family History  Problem Relation Age of Onset  . Diabetes Mother   . Hypertension Mother   . Hypertension Father   . Heart attack Father 768   This is what he died from.  . Diabetes Sister   . Diabetes Brother   . Diabetes Brother   . Diabetes Sister    Social History   Occupational History  . Not on file.   Social History Main Topics  . Smoking status: Former Smoker    Packs/day: 1.00    Years: 20.00    Types: Cigars    Quit date: 09/06/1981  . Smokeless tobacco: Never Used  . Alcohol use No  . Drug use: No  . Sexual activity: Yes    Partners: Female   Tobacco Counseling Counseling given: No   Activities of Daily Living In your present state of health, do you have any difficulty performing the following activities: 04/03/2016  Hearing? N  Vision? N  Difficulty concentrating or making decisions? N  Walking or climbing stairs? N  Dressing or bathing? N  Doing errands, shopping? N  Preparing Food and eating ? N  Using the Toilet? N  In the past six months, have you accidently leaked urine? Y  Do you have problems with loss of bowel control? N  Managing your Medications? N  Managing your Finances? N  Housekeeping or managing your Housekeeping? N  Some recent  data might be hidden    Immunizations and Health Maintenance Immunization History  Administered Date(s) Administered  . Influenza,inj,Quad PF,36+ Mos 01/02/2015, 12/05/2015  . Pneumococcal Conjugate-13 01/02/2015  . Pneumococcal Polysaccharide-23 04/03/2016  . Tdap 01/02/2015  . Zoster 12/05/2015   Health Maintenance Due  Topic Date Due  . PNA vac Low Risk Adult (2 of 2 - PPSV23) 01/02/2016  . FOOT EXAM  04/03/2016    Patient Care Team: Silverio Decamp, MD as PCP - General (Family Medicine)  Indicate any recent Medical Services you may have received from other than [facility] providers in the past year (date may be approximate).    Assessment:   This is a routine wellness  examination for Brian Rose.   Hearing/Vision screen No exam data present  Dietary issues and exercise activities discussed: Current Exercise Habits: Home exercise routine (He walks and lifts weights at home.), Type of exercise: strength training/weights;walking, Time (Minutes): 60, Frequency (Times/Week): 7, Weekly Exercise (Minutes/Week): 420, Intensity: Moderate, Exercise limited by: None identified  Goals      Patient Stated   . Exercise 3x per week (30 min per time) (pt-stated)          He would like to join a gym and do more weight lifting.      Depression Screen PHQ 2/9 Scores 04/03/2016 12/12/2014  PHQ - 2 Score 0 0    Fall Risk Fall Risk  04/03/2016 12/12/2014  Falls in the past year? No No    Cognitive Function:  Normal by direct observation.        Screening Tests Health Maintenance  Topic Date Due  . PNA vac Low Risk Adult (2 of 2 - PPSV23) 01/02/2016  . FOOT EXAM  04/03/2016  . HEMOGLOBIN A1C  06/04/2016  . OPHTHALMOLOGY EXAM  04/03/2017  . TETANUS/TDAP  01/01/2025  . INFLUENZA VACCINE  Completed  . ZOSTAVAX  Completed        Plan:    I have personally reviewed and addressed the Medicare Annual Wellness questionnaire and have noted the following in the patient's chart:  A. Medical and social history B. Use of alcohol, tobacco or illicit drugs He uses none of these. C. Current medications and supplements D. Functional ability and status E.  Nutritional status F.  Physical activity-He would like to increase his activity by joining a gym and doing more weight lifting and walking. G. Advance directives-He has these but onto on file and was advised to bring a copy for our records.  He verbalized understanding of this. H. List of other physicians-Dr. Nicole Kindred, nephrologist. I.  Hospitalizations, surgeries, and ER visits in previous 12 months-None mentioned. J.  Vitals-All WNL except his b/p was reported by him, that it is normally a little lower than today's  visit. K. Screenings to include cognitive, which was normal by direct observation an interview with the pt. & depression, which he had a score of 0 on this as being negative for depression today during interview and by direct observation. L. Referrals and appointments - none  In addition, I have reviewed and discussed with patient certain preventive protocols such as getting his second part of the pneumonia vaccine (which he declined from my recommendation),quality metrics, and best practice recommendations. A written personalized care plan for preventive services as well as general preventive health recommendations were provided to patient.  Signed,   Nestor Lewandowsky, RN

## 2016-04-03 NOTE — Assessment & Plan Note (Signed)
Decreasing carvedilol to 12.5 mg twice a day.

## 2016-04-03 NOTE — Assessment & Plan Note (Signed)
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 He is going to be enrolled in a study using an erythropoietin agonist.

## 2016-04-03 NOTE — Progress Notes (Signed)
  Subjective:    CC: Medicare physical  HPI:   This is a 76 year old african [place] male who presents for his medicare physical. The patient denies any complaints today however mentions he has noticed a worsening of his right bunion. Denies any pain and states at this time he does wish to proceed with surgical management. States his chronic kidney disease as well as anemia is currently being managed by his nephrologist.   Past medical history:  Negative.  See flowsheet/record as well for more information.  Surgical history: Negative.  See flowsheet/record as well for more information.  Family history: Negative.  See flowsheet/record as well for more information.  Social history: Negative.  See flowsheet/record as well for more information.  Allergies, and medications have been entered into the medical record, reviewed, and no changes needed.    Review of Systems: No headache, visual changes, nausea, vomiting, diarrhea, constipation, dizziness, abdominal pain, skin rash, fevers, chills, night sweats, swollen lymph nodes, weight loss, chest pain, body aches, joint swelling, muscle aches, shortness of breath, mood changes, visual or auditory hallucinations.  Objective:    General: Well Developed, well nourished, and in no acute distress.  Neuro: Alert and oriented x3, extra-ocular muscles intact, sensation grossly intact.  HEENT: Normocephalic, atraumatic, pupils equal round reactive to light, neck supple, no masses, no lymphadenopathy, thyroid nonpalpable. Small amount of cerumen present in bilateral EAC, TM non-erythematous bilaterally. Posterior pharynx without exudate and erythema. Skin: Warm and dry, no rashes noted.  Cardiac: Regular rate and rhythm, no rubs or gallops. 2+ systolic murmur noted at the LICS and RICS. 1+ pitting edema noted in bilateral lower extremities. Respiratory: Clear to auscultation bilaterally. Not using accessory muscles, speaking in full sentences.  Abdominal:  Soft, nontender, nondistended, positive bowel sounds, no masses, no organomegaly.  Musculoskeletal: Shoulder, elbow, wrist, hip, knee, ankle stable, and with full range of motion.    Impression and Recommendations:    The patient was counselled, risk factors were discussed, anticipatory guidance given.  Thatcher was seen today for annual exam.  Diagnoses and all orders for this visit:  Annual physical exam -     Cardio IQ (R) Lipoprotein (a)  Systolic murmur  Essential hypertension, benign -     carvedilol (COREG) 12.5 MG tablet; Take 1 tablet (12.5 mg total) by mouth 2 (two) times daily with a meal.  Microcytic anemia -     Vitamin B12 -     Folate -     Iron and TIBC -     Ferritin -     CBC -     VITAMIN D 25 Hydroxy (Vit-D Deficiency, Fractures)

## 2016-04-04 LAB — VITAMIN D 25 HYDROXY (VIT D DEFICIENCY, FRACTURES): Vit D, 25-Hydroxy: 52 ng/mL (ref 30–100)

## 2016-04-04 NOTE — Addendum Note (Signed)
Addended by: Silverio Decamp on: 04/04/2016 11:46 AM   Modules accepted: Orders

## 2016-04-08 ENCOUNTER — Encounter: Payer: Self-pay | Admitting: Sports Medicine

## 2016-04-09 LAB — CARDIO IQ(R) ADVANCED LIPID PANEL
Apolipoprotein B: 77 mg/dL (ref 52–109)
Cholesterol, Total: 177 mg/dL (ref <200)
Cholesterol/HDL Ratio: 3.1 calc (ref <5.0)
HDL Cholesterol: 57 mg/dL (ref >40)
LDL Large: 4695 nmol/L (ref 4334–10815)
LDL Medium: 151 nmol/L — ABNORMAL LOW (ref 167–465)
LDL Particle Number: 898 nmol/L — ABNORMAL LOW (ref 1016–2185)
LDL Peak Size: 228 Angstrom (ref >=218.2)
LDL Small: 120 nmol/L — ABNORMAL LOW (ref 123–441)
LDL, Calculated: 107 mg/dL — ABNORMAL HIGH (ref <100)
Lipoprotein (a): 209 nmol/L — ABNORMAL HIGH (ref <75)
Non-HDL Cholesterol: 120 mg/dL (calc) (ref <130)
Triglycerides: 44 mg/dL (ref <150)

## 2016-05-02 LAB — CARDIO IQ (R) LIPOPROTEIN (A)

## 2016-05-07 DIAGNOSIS — N185 Chronic kidney disease, stage 5: Secondary | ICD-10-CM | POA: Diagnosis not present

## 2016-06-14 ENCOUNTER — Emergency Department (INDEPENDENT_AMBULATORY_CARE_PROVIDER_SITE_OTHER)
Admission: EM | Admit: 2016-06-14 | Discharge: 2016-06-14 | Disposition: A | Discharge status: Home / Self Care | Payer: Medicare Other | Source: Home / Self Care | Attending: Family Medicine | Admitting: Family Medicine

## 2016-06-14 ENCOUNTER — Encounter: Payer: Self-pay | Admitting: Emergency Medicine

## 2016-06-14 DIAGNOSIS — K5900 Constipation, unspecified: Secondary | ICD-10-CM

## 2016-06-14 LAB — POCT CBC W AUTO DIFF (K'VILLE URGENT CARE)

## 2016-06-14 MED ORDER — LACTULOSE 10 GM/15ML PO SOLN: 10.0000 g | Freq: Two times a day (BID) | ORAL | 0 refills | Status: AC

## 2016-06-14 MED ORDER — BISACODYL 10 MG RE SUPP: 10.0000 mg | RECTAL | 0 refills | Status: AC | PRN

## 2016-06-14 NOTE — ED Provider Notes (Signed)
[name]    CSN: [id number] Arrival date & time: 06/14/16  1710     History   Chief Complaint Chief Complaint  Patient presents with  . Constipation    HPI Brian Rose is a 76 y.o. male.   Patient states that he has not had a bowel movement for 3 to 4 days, despite taking a laxative and Fleet's enema.  No abdominal pain.  No fevers, chills, and sweats.  No nausea/vomiting.  No recent change in bowel movements.   The history is provided by the patient.  Constipation  Severity:  Mild Time since last bowel movement:  4 days Timing:  Constant Progression:  Unchanged Chronicity:  New Context: not dehydration, not dietary changes, not medication and not narcotics   Stool description:  None produced Relieved by:  Nothing Worsened by:  Nothing Ineffective treatments:  Laxatives and enemas Associated symptoms: no abdominal pain, no anorexia, no back pain, no diarrhea and no dysuria     Past Medical History:  Diagnosis Date  . Chronic kidney disease   . Diabetes ([place])   . Hypertension     Patient Active Problem List   Diagnosis Date Noted  . Systolic murmur 27/25/3664  . Lumbar degenerative disc disease 12/05/2015  . Benign essential tremor 09/12/2015  . 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 Microcytic anemia 09/08/2013  . Gout 09/06/2013  . Left knee pain 09/06/2013  . Diabetes mellitus type 2 in nonobese ([place]) 09/06/2013  . Essential hypertension, benign 09/06/2013  . Annual physical exam 09/06/2013  . Chronic renal insufficiency 09/06/2013    History reviewed. No pertinent surgical history.     Home Medications    Prior to Admission medications   Medication Sig Start Date End Date Taking? Authorizing Provider  allopurinol (ZYLOPRIM) 300 MG tablet Take 300 mg by mouth daily.    Historical Provider, MD  amLODipine-benazepril (LOTREL) 10-20 MG capsule Take 1 capsule by mouth daily. 02/08/16   Silverio Decamp, MD  aspirin 81  MG tablet Take 81 mg by mouth 2 (two) times daily.    Historical Provider, MD  bisacodyl (BISACODYL LAXATIVE) 10 MG suppository Place 1 suppository (10 mg total) rectally as needed for moderate constipation. 06/14/16   Kandra Nicolas, MD  Blood Glucose Monitoring Suppl (BAYER CONTOUR MONITOR) W/DEVICE KIT Use as needed. 11/29/14   Silverio Decamp, MD  carvedilol (COREG) 12.5 MG tablet Take 1 tablet (12.5 mg total) by mouth 2 (two) times daily with a meal. 04/03/16   Silverio Decamp, MD  Cyanocobalamin (B-12) 1000 MCG CAPS Take by mouth.    Historical Provider, MD  fluticasone (FLONASE) 50 MCG/ACT nasal spray One spray in each nostril twice a day, use left hand for right nostril, and right hand for left nostril. 04/04/15   Silverio Decamp, MD  lactulose (CHRONULAC) 10 GM/15ML solution Take 15 mLs (10 g total) by mouth 2 (two) times daily. 06/14/16   Kandra Nicolas, MD  Multiple Vitamins-Minerals (MULTIVITAMIN PO) Take by mouth.    Historical Provider, MD  MULTIPLE VITAMINS-MINERALS PO Take 1 tablet by mouth every morning.    Historical Provider, MD  Niacin (VITAMIN B-3 PO) Take by mouth.    Historical Provider, MD  Omega-3 Fatty Acids (FISH OIL) 1000 MG CAPS Take by mouth.    Historical Provider, MD  pioglitazone (ACTOS) 30 MG tablet Take 1 tablet (30 mg total) by mouth daily. 12/29/15   Silverio Decamp, MD  pyridoxine (B-6)  100 MG tablet Take 100 mg by mouth daily.    Historical Provider, MD  traMADol (ULTRAM) 50 MG tablet 1-2 tabs by mouth Q8 hours, maximum 6 tabs per day. Patient not taking: Reported on 04/03/2016 12/05/15   Silverio Decamp, MD    Family History Family History  Problem Relation Age of Onset  . Diabetes Mother   . Hypertension Mother   . Hypertension Father   . Heart attack Father 11    This is what he died from.  . Diabetes Sister   . Diabetes Brother   . Diabetes Brother   . Diabetes Sister     Social History Social History  Substance Use  Topics  . Smoking status: Former Smoker    Packs/day: 1.00    Years: 20.00    Types: Cigars    Quit date: 09/06/1981  . Smokeless tobacco: Never Used  . Alcohol use No     Allergies   Patient has no known allergies.   Review of Systems Review of Systems  Gastrointestinal: Positive for constipation. Negative for abdominal pain, anorexia and diarrhea.  Genitourinary: Negative for dysuria.  Musculoskeletal: Negative for back pain.     Physical Exam Triage Vital Signs ED Triage Vitals  Enc Vitals Group     BP 06/14/16 1729 (!) 166/78     Pulse --      Resp --      Temp 06/14/16 1729 98 F (36.7 C)     Temp Source 06/14/16 1729 Oral     SpO2 06/14/16 1729 100 %     Weight 06/14/16 1730 172 lb 8 oz (78.2 kg)     Height --      Head Circumference --      Peak Flow --      Pain Score 06/14/16 1730 8     Pain Loc --      Pain Edu? --      Excl. in [place]? --    No data found.   Updated Vital Signs BP (!) 166/78 (BP Location: Left Arm)   Temp 98 F (36.7 C) (Oral)   Wt 172 lb 8 oz (78.2 kg)   SpO2 100%   BMI 24.75 kg/m   Visual Acuity Right Eye Distance:   Left Eye Distance:   Bilateral Distance:    Right Eye Near:   Left Eye Near:    Bilateral Near:     Physical Exam Nursing notes and Vital Signs reviewed. Appearance:  Patient appears stated age, and in no acute distress.    Eyes:  Pupils are equal, round, and reactive to light and accomodation.  Extraocular movement is intact.  Conjunctivae are not inflamed   Pharynx:  Normal; moist mucous membranes  Neck:  Supple.  No adenopathy Lungs:  Clear to auscultation.  Breath sounds are equal.  Moving air well. Heart:  Regular rate and rhythm without murmurs, rubs, or gallops.  Abdomen:  Nontender without masses or hepatosplenomegaly.  Bowel sounds are present.  No CVA or flank tenderness.  Extremities:  No edema.  Skin:  No rash present.     UC Treatments / Results  Labs (all labs ordered are listed, but  only abnormal results are displayed) Labs Reviewed  TSH  POCT CBC W AUTO DIFF ([facility]):  WBC 10.1; LY 14.7; MO 9.5; GR 75.8; Hgb 10.2; Platelets 184     EKG  EKG Interpretation None       Radiology No results found.  Procedures Procedures (including critical care time)  Medications Ordered in UC Medications - No data to display   Initial Impression / Assessment and Plan / UC Course  I have reviewed the triage vital signs and the nursing notes.  Pertinent labs & imaging results that were available during my care of the patient were reviewed by me and considered in my medical decision making (see chart for details).    Normal WBC reassuring.  TSH pending. Begin Lactulose syrup and Dulcolax suppositories. Increase fiber in diet.  If not improving in about 5 days, try Miralax (mix one capful in 4 ounces water and take once daily). If symptoms become significantly worse during the night or over the weekend, proceed to the local emergency room.  Followup with Family Doctor if not improved in about 10 days.    Final Clinical Impressions(s) / UC Diagnoses   Final diagnoses:  Constipation, unspecified constipation type    New Prescriptions New Prescriptions   BISACODYL (BISACODYL LAXATIVE) 10 MG SUPPOSITORY    Place 1 suppository (10 mg total) rectally as needed for moderate constipation.   LACTULOSE (CHRONULAC) 10 GM/15ML SOLUTION    Take 15 mLs (10 g total) by mouth 2 (two) times daily.     Kandra Nicolas, MD 06/25/16 1500

## 2016-06-14 NOTE — ED Triage Notes (Signed)
Patient presents to [facility] with complaints of Constipation and states he has not had a bowel movements in 3 days. No abdominal pain, just discomfort

## 2016-06-14 NOTE — Discharge Instructions (Signed)
Increase fiber in diet.  If not improving in about 5 days, try Miralax (mix one capful in 4 ounces water and take once daily). If symptoms become significantly worse during the night or over the weekend, proceed to the local emergency room.

## 2016-06-15 LAB — TSH: TSH: 1.16 m[IU]/L (ref 0.40–4.50)

## 2016-06-17 ENCOUNTER — Telehealth: Payer: Self-pay | Admitting: *Deleted

## 2016-06-17 NOTE — Telephone Encounter (Signed)
Attempted to call pt with thyroid lab results phone number is invalid.

## 2016-06-26 ENCOUNTER — Telehealth: Payer: Self-pay | Admitting: Family Medicine

## 2016-06-26 NOTE — Telephone Encounter (Signed)
Encounter created to add CBC order/scan result from [facility] 06/14/2016

## 2016-07-02 ENCOUNTER — Other Ambulatory Visit: Payer: Self-pay

## 2016-07-02 MED ORDER — PIOGLITAZONE HCL 30 MG PO TABS: 30.0000 mg | ORAL_TABLET | Freq: Every day | ORAL | 3 refills | Status: DC

## 2016-08-11 DIAGNOSIS — N2581 Secondary hyperparathyroidism of renal origin: Secondary | ICD-10-CM | POA: Insufficient documentation

## 2016-08-11 DIAGNOSIS — N185 Chronic kidney disease, stage 5: Secondary | ICD-10-CM | POA: Diagnosis not present

## 2016-08-11 DIAGNOSIS — N186 End stage renal disease: Secondary | ICD-10-CM | POA: Diagnosis not present

## 2016-08-11 DIAGNOSIS — M1000 Idiopathic gout, unspecified site: Secondary | ICD-10-CM | POA: Diagnosis not present

## 2016-08-11 DIAGNOSIS — D508 Other iron deficiency anemias: Secondary | ICD-10-CM | POA: Diagnosis not present

## 2016-08-11 DIAGNOSIS — I10 Essential (primary) hypertension: Secondary | ICD-10-CM | POA: Diagnosis not present

## 2016-08-14 ENCOUNTER — Telehealth: Payer: Self-pay

## 2016-08-14 NOTE — Telephone Encounter (Signed)
PT has a bill from [place] for his Medicare wellness visit on 04/03/16. The bill total is $257.00. There's a copy in your box. Will you help Brian Rose get this rectified.

## 2016-08-15 NOTE — Telephone Encounter (Signed)
I called [facility] @ [phone] and spoke with Santiago Glad.  I added 2 diagnosis codes, E53.8 for Vitamin B12 deficiency and Z13.21, screening for Vitamin D.  I called and spoke with Jeralyn Ruths and told him to allow 4 weeks for reprocessing.

## 2016-09-12 DIAGNOSIS — D509 Iron deficiency anemia, unspecified: Secondary | ICD-10-CM | POA: Diagnosis not present

## 2016-09-19 DIAGNOSIS — D509 Iron deficiency anemia, unspecified: Secondary | ICD-10-CM | POA: Diagnosis not present

## 2016-10-29 DIAGNOSIS — I10 Essential (primary) hypertension: Secondary | ICD-10-CM | POA: Diagnosis not present

## 2016-10-29 DIAGNOSIS — N186 End stage renal disease: Secondary | ICD-10-CM | POA: Diagnosis not present

## 2016-10-29 DIAGNOSIS — N2581 Secondary hyperparathyroidism of renal origin: Secondary | ICD-10-CM | POA: Diagnosis not present

## 2016-10-29 DIAGNOSIS — D508 Other iron deficiency anemias: Secondary | ICD-10-CM | POA: Diagnosis not present

## 2016-10-29 DIAGNOSIS — N185 Chronic kidney disease, stage 5: Secondary | ICD-10-CM | POA: Diagnosis not present

## 2017-01-28 DIAGNOSIS — D508 Other iron deficiency anemias: Secondary | ICD-10-CM | POA: Diagnosis not present

## 2017-01-28 DIAGNOSIS — N2581 Secondary hyperparathyroidism of renal origin: Secondary | ICD-10-CM | POA: Diagnosis not present

## 2017-01-28 DIAGNOSIS — N186 End stage renal disease: Secondary | ICD-10-CM | POA: Diagnosis not present

## 2017-01-28 DIAGNOSIS — I10 Essential (primary) hypertension: Secondary | ICD-10-CM | POA: Diagnosis not present

## 2017-01-28 DIAGNOSIS — N185 Chronic kidney disease, stage 5: Secondary | ICD-10-CM | POA: Diagnosis not present

## 2017-02-06 ENCOUNTER — Other Ambulatory Visit: Payer: Self-pay | Admitting: Sports Medicine

## 2017-02-06 DIAGNOSIS — I10 Essential (primary) hypertension: Secondary | ICD-10-CM

## 2017-04-02 DIAGNOSIS — H25013 Cortical age-related cataract, bilateral: Secondary | ICD-10-CM | POA: Diagnosis not present

## 2017-04-02 DIAGNOSIS — E119 Type 2 diabetes mellitus without complications: Secondary | ICD-10-CM | POA: Diagnosis not present

## 2017-04-02 DIAGNOSIS — H40013 Open angle with borderline findings, low risk, bilateral: Secondary | ICD-10-CM | POA: Diagnosis not present

## 2017-04-21 ENCOUNTER — Encounter: Payer: Self-pay | Admitting: Sports Medicine

## 2017-04-21 ENCOUNTER — Ambulatory Visit (INDEPENDENT_AMBULATORY_CARE_PROVIDER_SITE_OTHER): Payer: Medicare Other | Admitting: Sports Medicine

## 2017-04-21 VITALS — BP 156/65 | HR 67 | Resp 18 | Wt 165.0 lb

## 2017-04-21 DIAGNOSIS — M25562 Pain in left knee: Secondary | ICD-10-CM

## 2017-04-21 DIAGNOSIS — E119 Type 2 diabetes mellitus without complications: Secondary | ICD-10-CM

## 2017-04-21 DIAGNOSIS — G8929 Other chronic pain: Secondary | ICD-10-CM

## 2017-04-21 DIAGNOSIS — Z0000 Encounter for general adult medical examination without abnormal findings: Secondary | ICD-10-CM

## 2017-04-21 DIAGNOSIS — M1A09X0 Idiopathic chronic gout, multiple sites, without tophus (tophi): Secondary | ICD-10-CM | POA: Diagnosis not present

## 2017-04-21 DIAGNOSIS — N189 Chronic kidney disease, unspecified: Secondary | ICD-10-CM

## 2017-04-21 DIAGNOSIS — N289 Disorder of kidney and ureter, unspecified: Secondary | ICD-10-CM

## 2017-04-21 DIAGNOSIS — I10 Essential (primary) hypertension: Secondary | ICD-10-CM | POA: Diagnosis not present

## 2017-04-21 DIAGNOSIS — E7800 Pure hypercholesterolemia, unspecified: Secondary | ICD-10-CM | POA: Diagnosis not present

## 2017-04-21 MED ORDER — AMLODIPINE BESYLATE-VALSARTAN 10-320 MG PO TABS: 1.0000 | ORAL_TABLET | Freq: Every day | ORAL | 3 refills | Status: DC

## 2017-04-21 NOTE — Assessment & Plan Note (Signed)
Routine Medicare physical as above. Up-to-date on screening measures.

## 2017-04-21 NOTE — Progress Notes (Signed)
Subjective:   Brian Rose is a 77 y.o. male who presents for Medicare Annual/Subsequent preventive examination.  Review of Systems:  A comprehensive review of systems was negative.    Objective:    Vitals: BP (!) 156/65   Pulse 67   Resp 18   Wt 165 lb (74.8 kg)   BMI 23.68 kg/m   Body mass index is 23.68 kg/m.  Advanced Directives 04/03/2016 12/12/2015  Does Patient Have a Medical Advance Directive? Yes Yes  Type of Paramedic of [place];Living will [place];Living will  Does patient want to make changes to medical advance directive? No - Patient declined -  Copy of [place] in Chart? No - copy requested No - copy requested    Tobacco Social History   Tobacco Use  Smoking Status Former Smoker  . Packs/day: 1.00  . Years: 20.00  . Pack years: 20.00  . Types: Cigars  . Last attempt to quit: 09/06/1981  . Years since quitting: 35.6  Smokeless Tobacco Never Used     Counseling given: Not Answered   Clinical Intake: Doing well, does have some swelling behind the left knee.  Past Medical History:  Diagnosis Date  . Chronic kidney disease   . Diabetes ([place])   . Hypertension    No past surgical history on file. Family History  Problem Relation Age of Onset  . Diabetes Mother   . Hypertension Mother   . Hypertension Father   . Heart attack Father 77       This is what he died from.  . Diabetes Sister   . Diabetes Brother   . Diabetes Brother   . Diabetes Sister    Social History   Socioeconomic History  . Marital status: Married    Spouse name: None  . Number of children: None  . Years of education: None  . Highest education level: None  Social Needs  . Financial resource strain: None  . Food insecurity - worry: None  . Food insecurity - inability: None  . Transportation needs - medical: None  . Transportation needs - non-medical: None  Occupational History  . None  Tobacco Use    . Smoking status: Former Smoker    Packs/day: 1.00    Years: 20.00    Pack years: 20.00    Types: Cigars    Last attempt to quit: 09/06/1981    Years since quitting: 35.6  . Smokeless tobacco: Never Used  Substance and Sexual Activity  . Alcohol use: No  . Drug use: No  . Sexual activity: Yes    Partners: Female  Other Topics Concern  . None  Social History Narrative  . None    Outpatient Encounter Medications as of 04/21/2017  Medication Sig  . allopurinol (ZYLOPRIM) 100 MG tablet Take 100 mg by mouth 2 (two) times daily.  . [DISCONTINUED] allopurinol (ZYLOPRIM) 100 MG tablet Take by mouth.  Marland Kitchen amLODipine-valsartan (EXFORGE) 10-320 MG tablet Take 1 tablet by mouth daily.  Marland Kitchen aspirin 81 MG tablet Take 81 mg by mouth 2 (two) times daily.  . bisacodyl (BISACODYL LAXATIVE) 10 MG suppository Place 1 suppository (10 mg total) rectally as needed for moderate constipation.  . Blood Glucose Monitoring Suppl (BAYER CONTOUR MONITOR) W/DEVICE KIT Use as needed.  . carvedilol (COREG) 12.5 MG tablet Take 1 tablet (12.5 mg total) by mouth 2 (two) times daily with a meal.  . Cyanocobalamin (B-12) 1000 MCG CAPS Take  by mouth.  . fluticasone (FLONASE) 50 MCG/ACT nasal spray One spray in each nostril twice a day, use left hand for right nostril, and right hand for left nostril.  Marland Kitchen lactulose (CHRONULAC) 10 GM/15ML solution Take 15 mLs (10 g total) by mouth 2 (two) times daily.  . Multiple Vitamins-Minerals (MULTIVITAMIN PO) Take by mouth.  . MULTIPLE VITAMINS-MINERALS PO Take 1 tablet by mouth every morning.  . Niacin (VITAMIN B-3 PO) Take by mouth.  . Omega-3 Fatty Acids (FISH OIL) 1000 MG CAPS Take by mouth.  . pioglitazone (ACTOS) 30 MG tablet Take 1 tablet (30 mg total) by mouth daily.  Marland Kitchen pyridoxine (B-6) 100 MG tablet Take 100 mg by mouth daily.  . traMADol (ULTRAM) 50 MG tablet 1-2 tabs by mouth Q8 hours, maximum 6 tabs per day. (Patient not taking: Reported on 04/03/2016)  . [DISCONTINUED]  allopurinol (ZYLOPRIM) 300 MG tablet Take 300 mg by mouth daily.  . [DISCONTINUED] amLODipine-benazepril (LOTREL) 10-20 MG capsule TAKE 1 CAPSULE BY MOUTH DAILY   No facility-administered encounter medications on file as of 04/21/2017.    General: Well Developed, well nourished, and in no acute distress.  Neuro: Alert and oriented x3, extra-ocular muscles intact, sensation grossly intact. Cranial nerves II through XII are intact, motor, sensory, and coordinative functions are all intact. HEENT: Normocephalic, atraumatic, pupils equal round reactive to light, neck supple, no masses, no lymphadenopathy, thyroid nonpalpable. Oropharynx, nasopharynx, external ear canals are unremarkable. Skin: Warm and dry, no rashes noted.  Cardiac: Regular rate and rhythm, no murmurs rubs or gallops.  Respiratory: Clear to auscultation bilaterally. Not using accessory muscles, speaking in full sentences.  Abdominal: Soft, nontender, nondistended, positive bowel sounds, no masses, no organomegaly.  Left Knee: Normal to inspection with no erythema or effusion or obvious bony abnormalities. Palpable Baker's cyst behind the left knee ROM normal in flexion and extension and lower leg rotation. Ligaments with solid consistent endpoints including ACL, PCL, LCL, MCL. Negative Mcmurray's and provocative meniscal tests. Non painful patellar compression. Patellar and quadriceps tendons unremarkable. Hamstring and quadriceps strength is normal.  Procedure: Real-time Ultrasound Guided aspiration and injection of left knee Baker's cyst Device: GE Logiq E  Verbal informed consent obtained.  Time-out conducted.  Noted no overlying erythema, induration, or other signs of local infection.  Skin prepped in a sterile fashion.  Local anesthesia: Topical Ethyl chloride.  With sterile technique and under real time ultrasound guidance: Using an 18-gauge needle I aspirated 55 cc of straw-colored fluid, syringe switched and 1 cc  kenalog 40, 1 cc lidocaine injected easily. Completed without difficulty  Pain immediately resolved suggesting accurate placement of the medication.  Advised to call if fevers/chills, erythema, induration, drainage, or persistent bleeding.  Images permanently stored and available for review in the ultrasound unit.  Impression: Technically successful ultrasound guided injection.  Activities of Daily Living No flowsheet data found.  Patient Care Team: Silverio Decamp, MD as PCP - General (Family Medicine)   Assessment:   This is a routine wellness examination for Brian Rose.  Exercise Activities and Dietary recommendations    Goals    . Exercise 3x per week (30 min per time) (pt-stated)     He would like to join a gym and do more weight lifting.       Fall Risk Fall Risk  04/21/2017 04/03/2016 12/12/2014  Falls in the past year? No No No   Is the patient's home free of loose throw rugs in walkways, pet beds, electrical cords, etc?  yes      Grab bars in the bathroom? no      Handrails on the stairs?   yes      Adequate lighting?   yes  Timed Get Up and Go Performed: Normal  Depression Screen PHQ 2/9 Scores 04/21/2017 04/03/2016 12/12/2014  PHQ - 2 Score 0 0 0    Cognitive Function Normal cognitive function.  Immunization History  Administered Date(s) Administered  . Influenza,inj,Quad PF,6+ Mos 01/02/2015, 12/05/2015  . Pneumococcal Conjugate-13 01/02/2015  . Pneumococcal Polysaccharide-23 04/03/2016  . Tdap 01/02/2015  . Zoster 12/05/2015    Qualifies for Shingles Vaccine?  We will address at the follow-up visit  Screening Tests Health Maintenance  Topic Date Due  . HEMOGLOBIN A1C  06/04/2016  . INFLUENZA VACCINE  09/24/2016  . FOOT EXAM  04/03/2017  . OPHTHALMOLOGY EXAM  02/24/2018  . TETANUS/TDAP  01/01/2025  . PNA vac Low Risk Adult  Completed   Cancer Screenings: Lung: Low Dose CT Chest recommended if Age 61-80 years, 30 pack-year currently smoking OR  have quit w/in 15years. Patient does not qualify. Colorectal: UTD  Additional Screenings: None Hepatitis B/HIV/Syphillis: Hepatitis C Screening:     Plan:   See below in problem list  I have personally reviewed and noted the following in the patient's chart:   . Medical and social history . Use of alcohol, tobacco or illicit drugs  . Current medications and supplements . Functional ability and status . Nutritional status . Physical activity . Advanced directives . List of other physicians . Hospitalizations, surgeries, and ER visits in previous 12 months . Vitals . Screenings to include cognitive, depression, and falls . Referrals and appointments  In addition, I have reviewed and discussed with patient certain preventive protocols, quality metrics, and best practice recommendations. A written personalized care plan for preventive services as well as general preventive health recommendations were provided to patient.   ___________________________________________ Gwen Her. Dianah Field, M.D., ABFM., CAQSM. Primary Care and [place] Instructor of [place] of [facility] of Medicine

## 2017-04-21 NOTE — Assessment & Plan Note (Signed)
Rechecking uric acid levels.

## 2017-04-21 NOTE — Assessment & Plan Note (Signed)
Rechecking renal function, avoiding nephrotoxic drugs.

## 2017-04-21 NOTE — Assessment & Plan Note (Signed)
Known osteoarthritis, previous injections have provided 3-4 years of relief. Does not have a whole lot of pain right now but does have a fairly large Baker's cyst behind his left knee. Aspiration and injection, return in 1 month for this.

## 2017-04-21 NOTE — Assessment & Plan Note (Signed)
Rechecking hemoglobin A1c 

## 2017-04-21 NOTE — Assessment & Plan Note (Signed)
Blood pressure is still elevated, continue carvedilol 12.5 twice a day, pulse is 66. Switching from amlodipine/benazepril to amlodipine and valsartan combo max dose. If this is too expensive we will do amlodipine and valsartan separately. Return in 2 weeks for a nurse blood pressure check.

## 2017-04-22 LAB — CBC
HCT: 30.1 % — ABNORMAL LOW (ref 38.5–50.0)
Hemoglobin: 9.7 g/dL — ABNORMAL LOW (ref 13.2–17.1)
MCH: 25.4 pg — ABNORMAL LOW (ref 27.0–33.0)
MCHC: 32.2 g/dL (ref 32.0–36.0)
MCV: 78.8 fL — ABNORMAL LOW (ref 80.0–100.0)
Platelets: 176 Thousand/uL (ref 140–400)
RBC: 3.82 10*6/uL — ABNORMAL LOW (ref 4.20–5.80)
RDW: 16.7 % — ABNORMAL HIGH (ref 11.0–15.0)
WBC: 6.1 Thousand/uL (ref 3.8–10.8)

## 2017-04-22 LAB — COMPREHENSIVE METABOLIC PANEL
AG Ratio: 1.4 (calc) (ref 1.0–2.5)
ALT: 11 U/L (ref 9–46)
Albumin: 4.3 g/dL (ref 3.6–5.1)
Alkaline phosphatase (APISO): 63 U/L (ref 40–115)
Calcium: 9.9 mg/dL (ref 8.6–10.3)
Chloride: 104 mmol/L (ref 98–110)
Creat: 6 mg/dL — ABNORMAL HIGH (ref 0.70–1.18)
Globulin: 3.1 g/dL (calc) (ref 1.9–3.7)
Potassium: 5.5 mmol/L — ABNORMAL HIGH (ref 3.5–5.3)
Total Bilirubin: 0.5 mg/dL (ref 0.2–1.2)

## 2017-04-22 LAB — HEMOGLOBIN A1C
Hgb A1c MFr Bld: 6 % of total Hgb — ABNORMAL HIGH (ref <5.7)
Mean Plasma Glucose: 126 (calc)
eAG (mmol/L): 7 (calc)

## 2017-04-22 LAB — LIPID PANEL W/REFLEX DIRECT LDL
Cholesterol: 181 mg/dL (ref <200)
HDL: 53 mg/dL (ref >40)
LDL Cholesterol (Calc): 115 mg/dL — ABNORMAL HIGH
Non-HDL Cholesterol (Calc): 128 mg/dL (calc) (ref <130)
Total CHOL/HDL Ratio: 3.4 (calc) (ref <5.0)
Triglycerides: 44 mg/dL (ref <150)

## 2017-04-22 LAB — COMPREHENSIVE METABOLIC PANEL WITH GFR
AST: 21 U/L (ref 10–35)
BUN/Creatinine Ratio: 10 (calc) (ref 6–22)
BUN: 61 mg/dL — ABNORMAL HIGH (ref 7–25)
CO2: 22 mmol/L (ref 20–32)
Glucose, Bld: 120 mg/dL — ABNORMAL HIGH (ref 65–99)
Sodium: 136 mmol/L (ref 135–146)
Total Protein: 7.4 g/dL (ref 6.1–8.1)

## 2017-04-22 LAB — URIC ACID: Uric Acid, Serum: 4.3 mg/dL (ref 4.0–8.0)

## 2017-04-27 ENCOUNTER — Other Ambulatory Visit: Payer: Self-pay | Admitting: Sports Medicine

## 2017-04-27 DIAGNOSIS — I10 Essential (primary) hypertension: Secondary | ICD-10-CM

## 2017-04-29 DIAGNOSIS — M1000 Idiopathic gout, unspecified site: Secondary | ICD-10-CM | POA: Diagnosis not present

## 2017-04-29 DIAGNOSIS — I10 Essential (primary) hypertension: Secondary | ICD-10-CM | POA: Diagnosis not present

## 2017-04-29 DIAGNOSIS — N185 Chronic kidney disease, stage 5: Secondary | ICD-10-CM | POA: Diagnosis not present

## 2017-04-29 DIAGNOSIS — D508 Other iron deficiency anemias: Secondary | ICD-10-CM | POA: Diagnosis not present

## 2017-04-29 DIAGNOSIS — N186 End stage renal disease: Secondary | ICD-10-CM | POA: Diagnosis not present

## 2017-04-29 DIAGNOSIS — N2581 Secondary hyperparathyroidism of renal origin: Secondary | ICD-10-CM | POA: Diagnosis not present

## 2017-05-05 ENCOUNTER — Ambulatory Visit (INDEPENDENT_AMBULATORY_CARE_PROVIDER_SITE_OTHER): Payer: Medicare Other | Admitting: Sports Medicine

## 2017-05-05 VITALS — BP 140/68 | HR 64 | Temp 98.2°F | Resp 16 | Wt 166.6 lb

## 2017-05-05 DIAGNOSIS — I10 Essential (primary) hypertension: Secondary | ICD-10-CM

## 2017-05-05 DIAGNOSIS — N185 Chronic kidney disease, stage 5: Secondary | ICD-10-CM | POA: Diagnosis not present

## 2017-05-05 DIAGNOSIS — D631 Anemia in chronic kidney disease: Secondary | ICD-10-CM | POA: Diagnosis not present

## 2017-05-05 NOTE — Progress Notes (Signed)
HPI: Patient is here for blood pressure check. Patient denies chest pains, palpitations, shortness of breath.  Assessment and Plan: Elevated blood pressure reviewed with provider. Noted changed in hypertension by Nephrologist:  Amlodipine-Valsartan 10-320 mg to Amlodipine 10 mg Oral daily and Carvedilol 12.5 mg to 25 mg. Advised patient to keep follow up appt with provider on 05/19/17 to discuss medication changes further. Patient stated he understood.

## 2017-05-19 ENCOUNTER — Ambulatory Visit (INDEPENDENT_AMBULATORY_CARE_PROVIDER_SITE_OTHER): Payer: Medicare Other | Admitting: Sports Medicine

## 2017-05-19 ENCOUNTER — Encounter: Payer: Self-pay | Admitting: Sports Medicine

## 2017-05-19 DIAGNOSIS — I10 Essential (primary) hypertension: Secondary | ICD-10-CM | POA: Diagnosis not present

## 2017-05-19 MED ORDER — HYDRALAZINE HCL 25 MG PO TABS: 25.0000 mg | ORAL_TABLET | Freq: Three times a day (TID) | ORAL | 0 refills | Status: DC

## 2017-05-19 NOTE — Assessment & Plan Note (Signed)
Blood pressure is now elevated, only on carvedilol and amlodipine. Valsartan discontinued by nephrology due to worsening renal insufficiency, adding hydralazine low-dose. He will check his blood pressures at home over the next week and send me an email before actually starting the hydralazine. Creatinine clearance is 11 so no dosage adjustment needed.

## 2017-05-19 NOTE — Progress Notes (Signed)
Subjective:    CC: F/u HTN  HPI: Brian Rose, a 77yo male with a medical history significant for type 2 DM, renal artery stenosis, hypertension and gout, currently on maximum dosage of his anti-hypertensives (amlodopine and carvedilol), is presenting today for follow up for his hypertension. Pt endorses adherence to anti-hypertensive medication and  reports home blood pressure readings taken while standing, ranging from 678-938 systolic and 10-17 diastolic since the last visit.  Pt denies any associated symptoms (diziness, changes in vision or blurry vision, and changes in urination output or frequency) or changes in his diet since his last visit.   I reviewed the past medical history, family history, social history, surgical history, and allergies today and no changes were needed.  Please see the problem list section below in epic for further details.  Past Medical History: Past Medical History:  Diagnosis Date  . Chronic kidney disease   . Diabetes ([place])   . Hypertension    Past Surgical History: No past surgical history on file. Social History: Social History   Socioeconomic History  . Marital status: Married    Spouse name: Not on file  . Number of children: Not on file  . Years of education: Not on file  . Highest education level: Not on file  Occupational History  . Not on file  Social Needs  . Financial resource strain: Not on file  . Food insecurity:    Worry: Not on file    Inability: Not on file  . Transportation needs:    Medical: Not on file    Non-medical: Not on file  Tobacco Use  . Smoking status: Former Smoker    Packs/day: 1.00    Years: 20.00    Pack years: 20.00    Types: Cigars    Last attempt to quit: 09/06/1981    Years since quitting: 35.7  . Smokeless tobacco: Never Used  Substance and Sexual Activity  . Alcohol use: No  . Drug use: No  . Sexual activity: Yes    Partners: Female  Lifestyle  . Physical activity:    Days per week: Not on file      Minutes per session: Not on file  . Stress: Not on file  Relationships  . Social connections:    Talks on phone: Not on file    Gets together: Not on file    Attends religious service: Not on file    Active member of club or organization: Not on file    Attends meetings of clubs or organizations: Not on file    Relationship status: Not on file  Other Topics Concern  . Not on file  Social History Narrative  . Not on file   Family History: Family History  Problem Relation Age of Onset  . Diabetes Mother   . Hypertension Mother   . Hypertension Father   . Heart attack Father 61       This is what he died from.  . Diabetes Sister   . Diabetes Brother   . Diabetes Brother   . Diabetes Sister    Allergies: No Known Allergies Medications: See med rec.  Review of Systems: No fevers, chills, night sweats, weight loss, chest pain, or shortness of breath.   Objective:    Vitals (BP): 171/83 repeat was lower 151/80 HR: 71 General: Well Developed, well nourished, and in no acute distress.  Neuro: Alert and oriented x3, extra-ocular muscles intact, sensation grossly intact.  HEENT: Normocephalic, atraumatic, pupils equal  round reactive to light, neck supple, no masses, no lymphadenopathy, thyroid nonpalpable.  Skin: Warm and dry, no rashes. Cardiac: Regular rate and rhythm, no murmurs rubs or gallops, lower extremity 2+ edema.  Renal:Negative Brian Rose Punch sign bilaterally Respiratory: Clear to auscultation bilaterally. Not using accessory muscles, speaking in full sentences.   Impression and Recommendations:    Pts blood pressure seems refractory to current regimen.  Because pt endorses lower blood pressure readings at home, Dr. Burman Foster engaged in shared decision making and agreed to have patient monitor blood pressure readings for an additional week.  Pt was advised to record blood pressure in the morning and evening for a week and send results through mychart.  Dr.  Dianah Field will review readings and advise on whether to begin lowest dose of  Hydralazine.   ___________________________________________ Gwen Her. Dianah Field, M.D., ABFM., CAQSM. Primary Care and [place] Instructor of [place] of [facility] of Medicine

## 2017-05-25 ENCOUNTER — Encounter: Payer: Self-pay | Admitting: Sports Medicine

## 2017-05-25 MED ORDER — CLONIDINE HCL 0.1 MG PO TABS: 0.1000 mg | ORAL_TABLET | Freq: Three times a day (TID) | ORAL | 3 refills | Status: DC

## 2017-06-03 ENCOUNTER — Ambulatory Visit (INDEPENDENT_AMBULATORY_CARE_PROVIDER_SITE_OTHER): Payer: Medicare Other | Admitting: Sports Medicine

## 2017-06-03 ENCOUNTER — Encounter: Payer: Self-pay | Admitting: Sports Medicine

## 2017-06-03 DIAGNOSIS — I10 Essential (primary) hypertension: Secondary | ICD-10-CM

## 2017-06-03 NOTE — Progress Notes (Signed)
Subjective:    CC: Follow-up  HPI: Brian Rose returns, we had a difficult job controlling his elevated blood pressure, he recently went to his nephrologist, was placed on furosemide, his blood pressure is now run between the 970Y and 637 systolic.  No presyncope, no weakness, feeling okay, initially we had suggested that he try clonidine, he never started this.  I reviewed the past medical history, family history, social history, surgical history, and allergies today and no changes were needed.  Please see the problem list section below in epic for further details.  Past Medical History: Past Medical History:  Diagnosis Date  . Chronic kidney disease   . Diabetes ([place])   . Hypertension    Past Surgical History: No past surgical history on file. Social History: Social History   Socioeconomic History  . Marital status: Married    Spouse name: Not on file  . Number of children: Not on file  . Years of education: Not on file  . Highest education level: Not on file  Occupational History  . Not on file  Social Needs  . Financial resource strain: Not on file  . Food insecurity:    Worry: Not on file    Inability: Not on file  . Transportation needs:    Medical: Not on file    Non-medical: Not on file  Tobacco Use  . Smoking status: Former Smoker    Packs/day: 1.00    Years: 20.00    Pack years: 20.00    Types: Cigars    Last attempt to quit: 09/06/1981    Years since quitting: 35.7  . Smokeless tobacco: Never Used  Substance and Sexual Activity  . Alcohol use: No  . Drug use: No  . Sexual activity: Yes    Partners: Female  Lifestyle  . Physical activity:    Days per week: Not on file    Minutes per session: Not on file  . Stress: Not on file  Relationships  . Social connections:    Talks on phone: Not on file    Gets together: Not on file    Attends religious service: Not on file    Active member of club or organization: Not on file    Attends meetings of clubs or  organizations: Not on file    Relationship status: Not on file  Other Topics Concern  . Not on file  Social History Narrative  . Not on file   Family History: Family History  Problem Relation Age of Onset  . Diabetes Mother   . Hypertension Mother   . Hypertension Father   . Heart attack Father 4       This is what he died from.  . Diabetes Sister   . Diabetes Brother   . Diabetes Brother   . Diabetes Sister    Allergies: Allergies  Allergen Reactions  . Hydralazine Hypertension    Odd worsening of HTN.   Medications: See med rec.  Review of Systems: No fevers, chills, night sweats, weight loss, chest pain, or shortness of breath.   Objective:    General: Well Developed, well nourished, and in no acute distress.  Neuro: Alert and oriented x3, extra-ocular muscles intact, sensation grossly intact.  HEENT: Normocephalic, atraumatic, pupils equal round reactive to light, neck supple, no masses, no lymphadenopathy, thyroid nonpalpable.  Skin: Warm and dry, no rashes. Cardiac: Regular rate and rhythm, no murmurs rubs or gallops, no lower extremity edema.  Respiratory: Clear to auscultation bilaterally. Not  using accessory muscles, speaking in full sentences.  Impression and Recommendations:    Essential hypertension, benign Never took the clonidine but doing extremely well on the current regimen, all things considered I am okay with a systolic as high as 730, he typically runs 816E-387 systolic. Discontinue clonidine, continue the new dose of furosemide prescribed by his nephrologist, return to see me in 3 months. ___________________________________________ Gwen Her. Dianah Field, M.D., ABFM., CAQSM. Primary Care and [place] Instructor of [place] of [facility] of Medicine

## 2017-06-03 NOTE — Assessment & Plan Note (Signed)
Never took the clonidine but doing extremely well on the current regimen, all things considered I am okay with a systolic as high as 419, he typically runs 622W-979 systolic. Discontinue clonidine, continue the new dose of furosemide prescribed by his nephrologist, return to see me in 3 months.

## 2017-06-11 DIAGNOSIS — N185 Chronic kidney disease, stage 5: Secondary | ICD-10-CM | POA: Diagnosis not present

## 2017-06-11 DIAGNOSIS — D631 Anemia in chronic kidney disease: Secondary | ICD-10-CM | POA: Diagnosis not present

## 2017-07-08 DIAGNOSIS — D508 Other iron deficiency anemias: Secondary | ICD-10-CM | POA: Diagnosis not present

## 2017-07-08 DIAGNOSIS — I10 Essential (primary) hypertension: Secondary | ICD-10-CM | POA: Diagnosis not present

## 2017-07-08 DIAGNOSIS — N185 Chronic kidney disease, stage 5: Secondary | ICD-10-CM | POA: Diagnosis not present

## 2017-07-08 DIAGNOSIS — N2581 Secondary hyperparathyroidism of renal origin: Secondary | ICD-10-CM | POA: Diagnosis not present

## 2017-08-06 ENCOUNTER — Other Ambulatory Visit: Payer: Self-pay | Admitting: Sports Medicine

## 2017-09-02 ENCOUNTER — Encounter: Payer: Self-pay | Admitting: Sports Medicine

## 2017-09-02 ENCOUNTER — Ambulatory Visit (INDEPENDENT_AMBULATORY_CARE_PROVIDER_SITE_OTHER): Payer: Medicare Other | Admitting: Sports Medicine

## 2017-09-02 VITALS — BP 160/66 | HR 61 | Ht 69.0 in | Wt 172.0 lb

## 2017-09-02 DIAGNOSIS — E119 Type 2 diabetes mellitus without complications: Secondary | ICD-10-CM

## 2017-09-02 DIAGNOSIS — I10 Essential (primary) hypertension: Secondary | ICD-10-CM | POA: Diagnosis not present

## 2017-09-02 DIAGNOSIS — R5383 Other fatigue: Secondary | ICD-10-CM | POA: Diagnosis not present

## 2017-09-02 DIAGNOSIS — Z0000 Encounter for general adult medical examination without abnormal findings: Secondary | ICD-10-CM

## 2017-09-02 LAB — POCT GLYCOSYLATED HEMOGLOBIN (HGB A1C): Hemoglobin A1C: 5.7 % — AB (ref 4.0–5.6)

## 2017-09-02 MED ORDER — BUPROPION HCL ER (XL) 150 MG PO TB24: 150.0000 mg | ORAL_TABLET | ORAL | 3 refills | Status: DC

## 2017-09-02 NOTE — Assessment & Plan Note (Signed)
Nonspecific but likely multifactorial related to his chronic disease, mild depression, anemia of renal disease. Checking TSH, testosterone levels, PHQ/GAD done. Wellbutrin 150 daily, return in 1 month for repeat PHQ/GAD.

## 2017-09-02 NOTE — Assessment & Plan Note (Signed)
There is a white coat element, he is compliant with his blood pressure medications, took them about 30 minutes ago. At home his blood pressures are 115B to 262 systolic, no changes.

## 2017-09-02 NOTE — Assessment & Plan Note (Signed)
Up-to-date on screening measures, no more colon cancer screening needed.

## 2017-09-02 NOTE — Assessment & Plan Note (Signed)
Well-controlled hemoglobin A1c is down to 5.7%, no changes in medication.

## 2017-09-02 NOTE — Progress Notes (Signed)
Subjective:    CC: Follow-up  HPI: Diabetes mellitus type 2: Stable on current medications, hemoglobin A1c is down to 5.7%.  Hypertension: Elevated here but home readings are 119J to 478 systolic.  He does have chronic renal insufficiency with a creatinine of 5-6.  Fatigue: Nonspecific, feels tired most of the time, minimally depressed mood, no suicidal homicidal ideation.  I reviewed the past medical history, family history, social history, surgical history, and allergies today and no changes were needed.  Please see the problem list section below in epic for further details.  Past Medical History: Past Medical History:  Diagnosis Date  . Chronic kidney disease   . Diabetes ([place])   . Hypertension    Past Surgical History: No past surgical history on file. Social History: Social History   Socioeconomic History  . Marital status: Married    Spouse name: Not on file  . Number of children: Not on file  . Years of education: Not on file  . Highest education level: Not on file  Occupational History  . Not on file  Social Needs  . Financial resource strain: Not on file  . Food insecurity:    Worry: Not on file    Inability: Not on file  . Transportation needs:    Medical: Not on file    Non-medical: Not on file  Tobacco Use  . Smoking status: Former Smoker    Packs/day: 1.00    Years: 20.00    Pack years: 20.00    Types: Cigars    Last attempt to quit: 09/06/1981    Years since quitting: 36.0  . Smokeless tobacco: Never Used  Substance and Sexual Activity  . Alcohol use: No  . Drug use: No  . Sexual activity: Yes    Partners: Female  Lifestyle  . Physical activity:    Days per week: Not on file    Minutes per session: Not on file  . Stress: Not on file  Relationships  . Social connections:    Talks on phone: Not on file    Gets together: Not on file    Attends religious service: Not on file    Active member of club or organization: Not on file    Attends  meetings of clubs or organizations: Not on file    Relationship status: Not on file  Other Topics Concern  . Not on file  Social History Narrative  . Not on file   Family History: Family History  Problem Relation Age of Onset  . Diabetes Mother   . Hypertension Mother   . Hypertension Father   . Heart attack Father 83       This is what he died from.  . Diabetes Sister   . Diabetes Brother   . Diabetes Brother   . Diabetes Sister    Allergies: Allergies  Allergen Reactions  . Hydralazine Hypertension    Odd worsening of HTN.   Medications: See med rec.  Review of Systems: No fevers, chills, night sweats, weight loss, chest pain, or shortness of breath.   Objective:    General: Well Developed, well nourished, and in no acute distress.  Neuro: Alert and oriented x3, extra-ocular muscles intact, sensation grossly intact.  HEENT: Normocephalic, atraumatic, pupils equal round reactive to light, neck supple, no masses, no lymphadenopathy, thyroid nonpalpable.  Skin: Warm and dry, no rashes. Cardiac: Regular rate and rhythm, no murmurs rubs or gallops, no lower extremity edema.  Respiratory: Clear to auscultation bilaterally.  Not using accessory muscles, speaking in full sentences.  Impression and Recommendations:    Diabetes mellitus type 2 in nonobese Well-controlled hemoglobin A1c is down to 5.7%, no changes in medication.  Essential hypertension, benign There is a white coat element, he is compliant with his blood pressure medications, took them about 30 minutes ago. At home his blood pressures are 211H to 417 systolic, no changes.  Fatigue Nonspecific but likely multifactorial related to his chronic disease, mild depression, anemia of renal disease. Checking TSH, testosterone levels, PHQ/GAD done. Wellbutrin 150 daily, return in 1 month for repeat PHQ/GAD.  Annual physical exam Up-to-date on screening measures, no more colon cancer screening  needed. ___________________________________________ Gwen Her. Dianah Field, M.D., ABFM., CAQSM. Primary Care and [place] Instructor of [place] of [facility] of Medicine

## 2017-09-06 LAB — CBC
HCT: 32.5 % — ABNORMAL LOW (ref 38.5–50.0)
Hemoglobin: 10.4 g/dL — ABNORMAL LOW (ref 13.2–17.1)
MCH: 25.2 pg — ABNORMAL LOW (ref 27.0–33.0)
MCHC: 32 g/dL (ref 32.0–36.0)
MCV: 78.7 fL — ABNORMAL LOW (ref 80.0–100.0)
Platelets: 149 10*3/uL (ref 140–400)
RBC: 4.13 10*6/uL — ABNORMAL LOW (ref 4.20–5.80)
RDW: 16.8 % — ABNORMAL HIGH (ref 11.0–15.0)
WBC: 4.9 10*3/uL (ref 3.8–10.8)

## 2017-09-06 LAB — TSH: TSH: 1.44 m[IU]/L (ref 0.40–4.50)

## 2017-09-06 LAB — TESTOSTERONE, FREE & TOTAL
Free Testosterone: 53.7 pg/mL (ref 30.0–135.0)
Testosterone, Total, LC-MS-MS: 716 ng/dL (ref 250–1100)

## 2017-09-17 DIAGNOSIS — N19 Unspecified kidney failure: Secondary | ICD-10-CM | POA: Insufficient documentation

## 2017-09-17 DIAGNOSIS — R630 Anorexia: Secondary | ICD-10-CM | POA: Diagnosis not present

## 2017-09-17 DIAGNOSIS — D508 Other iron deficiency anemias: Secondary | ICD-10-CM | POA: Diagnosis not present

## 2017-09-17 DIAGNOSIS — I10 Essential (primary) hypertension: Secondary | ICD-10-CM | POA: Diagnosis not present

## 2017-09-17 DIAGNOSIS — N185 Chronic kidney disease, stage 5: Secondary | ICD-10-CM | POA: Diagnosis not present

## 2017-09-17 DIAGNOSIS — N2581 Secondary hyperparathyroidism of renal origin: Secondary | ICD-10-CM | POA: Diagnosis not present

## 2017-09-22 DIAGNOSIS — I10 Essential (primary) hypertension: Secondary | ICD-10-CM | POA: Diagnosis not present

## 2017-09-22 DIAGNOSIS — N186 End stage renal disease: Secondary | ICD-10-CM | POA: Diagnosis not present

## 2017-09-25 DIAGNOSIS — E7800 Pure hypercholesterolemia, unspecified: Secondary | ICD-10-CM | POA: Insufficient documentation

## 2017-10-01 ENCOUNTER — Ambulatory Visit: Payer: Medicare Other | Admitting: Sports Medicine

## 2017-10-01 DIAGNOSIS — Z79899 Other long term (current) drug therapy: Secondary | ICD-10-CM | POA: Diagnosis not present

## 2017-10-01 DIAGNOSIS — Z992 Dependence on renal dialysis: Secondary | ICD-10-CM | POA: Diagnosis not present

## 2017-10-01 DIAGNOSIS — E1122 Type 2 diabetes mellitus with diabetic chronic kidney disease: Secondary | ICD-10-CM | POA: Diagnosis not present

## 2017-10-01 DIAGNOSIS — Z7982 Long term (current) use of aspirin: Secondary | ICD-10-CM | POA: Diagnosis not present

## 2017-10-01 DIAGNOSIS — Z8673 Personal history of transient ischemic attack (TIA), and cerebral infarction without residual deficits: Secondary | ICD-10-CM | POA: Diagnosis not present

## 2017-10-01 DIAGNOSIS — N186 End stage renal disease: Secondary | ICD-10-CM | POA: Diagnosis not present

## 2017-10-01 DIAGNOSIS — N2581 Secondary hyperparathyroidism of renal origin: Secondary | ICD-10-CM | POA: Diagnosis not present

## 2017-10-01 DIAGNOSIS — I120 Hypertensive chronic kidney disease with stage 5 chronic kidney disease or end stage renal disease: Secondary | ICD-10-CM | POA: Diagnosis not present

## 2017-10-09 DIAGNOSIS — N2581 Secondary hyperparathyroidism of renal origin: Secondary | ICD-10-CM | POA: Diagnosis not present

## 2017-10-09 DIAGNOSIS — D631 Anemia in chronic kidney disease: Secondary | ICD-10-CM | POA: Diagnosis not present

## 2017-10-09 DIAGNOSIS — D509 Iron deficiency anemia, unspecified: Secondary | ICD-10-CM | POA: Diagnosis not present

## 2017-10-09 DIAGNOSIS — Z23 Encounter for immunization: Secondary | ICD-10-CM | POA: Diagnosis not present

## 2017-10-09 DIAGNOSIS — N186 End stage renal disease: Secondary | ICD-10-CM | POA: Diagnosis not present

## 2017-10-12 DIAGNOSIS — N2581 Secondary hyperparathyroidism of renal origin: Secondary | ICD-10-CM | POA: Diagnosis not present

## 2017-10-12 DIAGNOSIS — Z23 Encounter for immunization: Secondary | ICD-10-CM | POA: Diagnosis not present

## 2017-10-12 DIAGNOSIS — N186 End stage renal disease: Secondary | ICD-10-CM | POA: Diagnosis not present

## 2017-10-12 DIAGNOSIS — D631 Anemia in chronic kidney disease: Secondary | ICD-10-CM | POA: Diagnosis not present

## 2017-10-12 DIAGNOSIS — D509 Iron deficiency anemia, unspecified: Secondary | ICD-10-CM | POA: Diagnosis not present

## 2017-10-13 DIAGNOSIS — E118 Type 2 diabetes mellitus with unspecified complications: Secondary | ICD-10-CM | POA: Diagnosis not present

## 2017-10-13 DIAGNOSIS — D509 Iron deficiency anemia, unspecified: Secondary | ICD-10-CM | POA: Diagnosis not present

## 2017-10-13 DIAGNOSIS — Z23 Encounter for immunization: Secondary | ICD-10-CM | POA: Diagnosis not present

## 2017-10-13 DIAGNOSIS — Z114 Encounter for screening for human immunodeficiency virus [HIV]: Secondary | ICD-10-CM | POA: Diagnosis not present

## 2017-10-13 DIAGNOSIS — N186 End stage renal disease: Secondary | ICD-10-CM | POA: Diagnosis not present

## 2017-10-13 DIAGNOSIS — Z4932 Encounter for adequacy testing for peritoneal dialysis: Secondary | ICD-10-CM | POA: Diagnosis not present

## 2017-10-13 DIAGNOSIS — D631 Anemia in chronic kidney disease: Secondary | ICD-10-CM | POA: Diagnosis not present

## 2017-10-13 DIAGNOSIS — Z992 Dependence on renal dialysis: Secondary | ICD-10-CM | POA: Diagnosis not present

## 2017-10-13 DIAGNOSIS — N2581 Secondary hyperparathyroidism of renal origin: Secondary | ICD-10-CM | POA: Diagnosis not present

## 2017-10-14 DIAGNOSIS — D509 Iron deficiency anemia, unspecified: Secondary | ICD-10-CM | POA: Diagnosis not present

## 2017-10-14 DIAGNOSIS — N186 End stage renal disease: Secondary | ICD-10-CM | POA: Diagnosis not present

## 2017-10-14 DIAGNOSIS — D631 Anemia in chronic kidney disease: Secondary | ICD-10-CM | POA: Diagnosis not present

## 2017-10-14 DIAGNOSIS — Z23 Encounter for immunization: Secondary | ICD-10-CM | POA: Diagnosis not present

## 2017-10-14 DIAGNOSIS — N2581 Secondary hyperparathyroidism of renal origin: Secondary | ICD-10-CM | POA: Diagnosis not present

## 2017-10-15 DIAGNOSIS — D631 Anemia in chronic kidney disease: Secondary | ICD-10-CM | POA: Diagnosis not present

## 2017-10-15 DIAGNOSIS — N186 End stage renal disease: Secondary | ICD-10-CM | POA: Diagnosis not present

## 2017-10-15 DIAGNOSIS — Z23 Encounter for immunization: Secondary | ICD-10-CM | POA: Diagnosis not present

## 2017-10-15 DIAGNOSIS — N2581 Secondary hyperparathyroidism of renal origin: Secondary | ICD-10-CM | POA: Diagnosis not present

## 2017-10-15 DIAGNOSIS — D509 Iron deficiency anemia, unspecified: Secondary | ICD-10-CM | POA: Diagnosis not present

## 2017-10-16 DIAGNOSIS — D509 Iron deficiency anemia, unspecified: Secondary | ICD-10-CM | POA: Diagnosis not present

## 2017-10-16 DIAGNOSIS — N186 End stage renal disease: Secondary | ICD-10-CM | POA: Diagnosis not present

## 2017-10-17 DIAGNOSIS — D509 Iron deficiency anemia, unspecified: Secondary | ICD-10-CM | POA: Diagnosis not present

## 2017-10-17 DIAGNOSIS — N186 End stage renal disease: Secondary | ICD-10-CM | POA: Diagnosis not present

## 2017-10-18 DIAGNOSIS — D509 Iron deficiency anemia, unspecified: Secondary | ICD-10-CM | POA: Diagnosis not present

## 2017-10-18 DIAGNOSIS — N186 End stage renal disease: Secondary | ICD-10-CM | POA: Diagnosis not present

## 2017-10-19 DIAGNOSIS — N186 End stage renal disease: Secondary | ICD-10-CM | POA: Diagnosis not present

## 2017-10-19 DIAGNOSIS — D509 Iron deficiency anemia, unspecified: Secondary | ICD-10-CM | POA: Diagnosis not present

## 2017-10-20 DIAGNOSIS — N186 End stage renal disease: Secondary | ICD-10-CM | POA: Diagnosis not present

## 2017-10-20 DIAGNOSIS — D509 Iron deficiency anemia, unspecified: Secondary | ICD-10-CM | POA: Diagnosis not present

## 2017-10-21 DIAGNOSIS — N186 End stage renal disease: Secondary | ICD-10-CM | POA: Diagnosis not present

## 2017-10-21 DIAGNOSIS — Z4932 Encounter for adequacy testing for peritoneal dialysis: Secondary | ICD-10-CM | POA: Diagnosis not present

## 2017-10-21 DIAGNOSIS — D631 Anemia in chronic kidney disease: Secondary | ICD-10-CM | POA: Diagnosis not present

## 2017-10-21 DIAGNOSIS — Z23 Encounter for immunization: Secondary | ICD-10-CM | POA: Diagnosis not present

## 2017-10-21 DIAGNOSIS — D509 Iron deficiency anemia, unspecified: Secondary | ICD-10-CM | POA: Diagnosis not present

## 2017-10-21 DIAGNOSIS — N2581 Secondary hyperparathyroidism of renal origin: Secondary | ICD-10-CM | POA: Diagnosis not present

## 2017-10-22 DIAGNOSIS — D509 Iron deficiency anemia, unspecified: Secondary | ICD-10-CM | POA: Diagnosis not present

## 2017-10-22 DIAGNOSIS — N186 End stage renal disease: Secondary | ICD-10-CM | POA: Diagnosis not present

## 2017-10-23 DIAGNOSIS — D509 Iron deficiency anemia, unspecified: Secondary | ICD-10-CM | POA: Diagnosis not present

## 2017-10-23 DIAGNOSIS — N186 End stage renal disease: Secondary | ICD-10-CM | POA: Diagnosis not present

## 2017-10-24 DIAGNOSIS — N186 End stage renal disease: Secondary | ICD-10-CM | POA: Diagnosis not present

## 2017-10-24 DIAGNOSIS — D509 Iron deficiency anemia, unspecified: Secondary | ICD-10-CM | POA: Diagnosis not present

## 2017-10-25 DIAGNOSIS — D509 Iron deficiency anemia, unspecified: Secondary | ICD-10-CM | POA: Diagnosis not present

## 2017-10-25 DIAGNOSIS — N2581 Secondary hyperparathyroidism of renal origin: Secondary | ICD-10-CM | POA: Diagnosis not present

## 2017-10-25 DIAGNOSIS — N186 End stage renal disease: Secondary | ICD-10-CM | POA: Diagnosis not present

## 2017-10-25 DIAGNOSIS — D631 Anemia in chronic kidney disease: Secondary | ICD-10-CM | POA: Diagnosis not present

## 2017-10-26 DIAGNOSIS — D509 Iron deficiency anemia, unspecified: Secondary | ICD-10-CM | POA: Diagnosis not present

## 2017-10-26 DIAGNOSIS — N2581 Secondary hyperparathyroidism of renal origin: Secondary | ICD-10-CM | POA: Diagnosis not present

## 2017-10-26 DIAGNOSIS — D631 Anemia in chronic kidney disease: Secondary | ICD-10-CM | POA: Diagnosis not present

## 2017-10-26 DIAGNOSIS — N186 End stage renal disease: Secondary | ICD-10-CM | POA: Diagnosis not present

## 2017-10-27 DIAGNOSIS — N2581 Secondary hyperparathyroidism of renal origin: Secondary | ICD-10-CM | POA: Diagnosis not present

## 2017-10-27 DIAGNOSIS — D509 Iron deficiency anemia, unspecified: Secondary | ICD-10-CM | POA: Diagnosis not present

## 2017-10-27 DIAGNOSIS — N186 End stage renal disease: Secondary | ICD-10-CM | POA: Diagnosis not present

## 2017-10-27 DIAGNOSIS — D631 Anemia in chronic kidney disease: Secondary | ICD-10-CM | POA: Diagnosis not present

## 2017-10-28 DIAGNOSIS — N2581 Secondary hyperparathyroidism of renal origin: Secondary | ICD-10-CM | POA: Diagnosis not present

## 2017-10-28 DIAGNOSIS — D509 Iron deficiency anemia, unspecified: Secondary | ICD-10-CM | POA: Diagnosis not present

## 2017-10-28 DIAGNOSIS — N186 End stage renal disease: Secondary | ICD-10-CM | POA: Diagnosis not present

## 2017-10-28 DIAGNOSIS — D631 Anemia in chronic kidney disease: Secondary | ICD-10-CM | POA: Diagnosis not present

## 2017-10-29 DIAGNOSIS — D631 Anemia in chronic kidney disease: Secondary | ICD-10-CM | POA: Diagnosis not present

## 2017-10-29 DIAGNOSIS — D509 Iron deficiency anemia, unspecified: Secondary | ICD-10-CM | POA: Diagnosis not present

## 2017-10-29 DIAGNOSIS — N186 End stage renal disease: Secondary | ICD-10-CM | POA: Diagnosis not present

## 2017-10-29 DIAGNOSIS — N2581 Secondary hyperparathyroidism of renal origin: Secondary | ICD-10-CM | POA: Diagnosis not present

## 2017-10-30 DIAGNOSIS — N186 End stage renal disease: Secondary | ICD-10-CM | POA: Diagnosis not present

## 2017-10-30 DIAGNOSIS — N2581 Secondary hyperparathyroidism of renal origin: Secondary | ICD-10-CM | POA: Diagnosis not present

## 2017-10-30 DIAGNOSIS — D509 Iron deficiency anemia, unspecified: Secondary | ICD-10-CM | POA: Diagnosis not present

## 2017-10-30 DIAGNOSIS — D631 Anemia in chronic kidney disease: Secondary | ICD-10-CM | POA: Diagnosis not present

## 2017-10-31 DIAGNOSIS — D631 Anemia in chronic kidney disease: Secondary | ICD-10-CM | POA: Diagnosis not present

## 2017-10-31 DIAGNOSIS — N186 End stage renal disease: Secondary | ICD-10-CM | POA: Diagnosis not present

## 2017-10-31 DIAGNOSIS — N2581 Secondary hyperparathyroidism of renal origin: Secondary | ICD-10-CM | POA: Diagnosis not present

## 2017-10-31 DIAGNOSIS — D509 Iron deficiency anemia, unspecified: Secondary | ICD-10-CM | POA: Diagnosis not present

## 2017-11-01 DIAGNOSIS — N186 End stage renal disease: Secondary | ICD-10-CM | POA: Diagnosis not present

## 2017-11-01 DIAGNOSIS — D631 Anemia in chronic kidney disease: Secondary | ICD-10-CM | POA: Diagnosis not present

## 2017-11-01 DIAGNOSIS — N2581 Secondary hyperparathyroidism of renal origin: Secondary | ICD-10-CM | POA: Diagnosis not present

## 2017-11-01 DIAGNOSIS — D509 Iron deficiency anemia, unspecified: Secondary | ICD-10-CM | POA: Diagnosis not present

## 2017-11-02 DIAGNOSIS — N2581 Secondary hyperparathyroidism of renal origin: Secondary | ICD-10-CM | POA: Diagnosis not present

## 2017-11-02 DIAGNOSIS — E118 Type 2 diabetes mellitus with unspecified complications: Secondary | ICD-10-CM | POA: Diagnosis not present

## 2017-11-02 DIAGNOSIS — N186 End stage renal disease: Secondary | ICD-10-CM | POA: Diagnosis not present

## 2017-11-02 DIAGNOSIS — D631 Anemia in chronic kidney disease: Secondary | ICD-10-CM | POA: Diagnosis not present

## 2017-11-02 DIAGNOSIS — D509 Iron deficiency anemia, unspecified: Secondary | ICD-10-CM | POA: Diagnosis not present

## 2017-11-03 DIAGNOSIS — N186 End stage renal disease: Secondary | ICD-10-CM | POA: Diagnosis not present

## 2017-11-03 DIAGNOSIS — D509 Iron deficiency anemia, unspecified: Secondary | ICD-10-CM | POA: Diagnosis not present

## 2017-11-03 DIAGNOSIS — D631 Anemia in chronic kidney disease: Secondary | ICD-10-CM | POA: Diagnosis not present

## 2017-11-03 DIAGNOSIS — N2581 Secondary hyperparathyroidism of renal origin: Secondary | ICD-10-CM | POA: Diagnosis not present

## 2017-11-04 DIAGNOSIS — D509 Iron deficiency anemia, unspecified: Secondary | ICD-10-CM | POA: Diagnosis not present

## 2017-11-04 DIAGNOSIS — D631 Anemia in chronic kidney disease: Secondary | ICD-10-CM | POA: Diagnosis not present

## 2017-11-04 DIAGNOSIS — N2581 Secondary hyperparathyroidism of renal origin: Secondary | ICD-10-CM | POA: Diagnosis not present

## 2017-11-04 DIAGNOSIS — N186 End stage renal disease: Secondary | ICD-10-CM | POA: Diagnosis not present

## 2017-11-05 DIAGNOSIS — N186 End stage renal disease: Secondary | ICD-10-CM | POA: Diagnosis not present

## 2017-11-05 DIAGNOSIS — D631 Anemia in chronic kidney disease: Secondary | ICD-10-CM | POA: Diagnosis not present

## 2017-11-05 DIAGNOSIS — N2581 Secondary hyperparathyroidism of renal origin: Secondary | ICD-10-CM | POA: Diagnosis not present

## 2017-11-05 DIAGNOSIS — D509 Iron deficiency anemia, unspecified: Secondary | ICD-10-CM | POA: Diagnosis not present

## 2017-11-06 DIAGNOSIS — D631 Anemia in chronic kidney disease: Secondary | ICD-10-CM | POA: Diagnosis not present

## 2017-11-06 DIAGNOSIS — N2581 Secondary hyperparathyroidism of renal origin: Secondary | ICD-10-CM | POA: Diagnosis not present

## 2017-11-06 DIAGNOSIS — N186 End stage renal disease: Secondary | ICD-10-CM | POA: Diagnosis not present

## 2017-11-06 DIAGNOSIS — D509 Iron deficiency anemia, unspecified: Secondary | ICD-10-CM | POA: Diagnosis not present

## 2017-11-07 DIAGNOSIS — N2581 Secondary hyperparathyroidism of renal origin: Secondary | ICD-10-CM | POA: Diagnosis not present

## 2017-11-07 DIAGNOSIS — D509 Iron deficiency anemia, unspecified: Secondary | ICD-10-CM | POA: Diagnosis not present

## 2017-11-07 DIAGNOSIS — N186 End stage renal disease: Secondary | ICD-10-CM | POA: Diagnosis not present

## 2017-11-07 DIAGNOSIS — D631 Anemia in chronic kidney disease: Secondary | ICD-10-CM | POA: Diagnosis not present

## 2017-11-08 DIAGNOSIS — D509 Iron deficiency anemia, unspecified: Secondary | ICD-10-CM | POA: Diagnosis not present

## 2017-11-08 DIAGNOSIS — N186 End stage renal disease: Secondary | ICD-10-CM | POA: Diagnosis not present

## 2017-11-08 DIAGNOSIS — N2581 Secondary hyperparathyroidism of renal origin: Secondary | ICD-10-CM | POA: Diagnosis not present

## 2017-11-08 DIAGNOSIS — D631 Anemia in chronic kidney disease: Secondary | ICD-10-CM | POA: Diagnosis not present

## 2017-11-09 DIAGNOSIS — N2581 Secondary hyperparathyroidism of renal origin: Secondary | ICD-10-CM | POA: Diagnosis not present

## 2017-11-09 DIAGNOSIS — D631 Anemia in chronic kidney disease: Secondary | ICD-10-CM | POA: Diagnosis not present

## 2017-11-09 DIAGNOSIS — R300 Dysuria: Secondary | ICD-10-CM | POA: Diagnosis not present

## 2017-11-09 DIAGNOSIS — D509 Iron deficiency anemia, unspecified: Secondary | ICD-10-CM | POA: Diagnosis not present

## 2017-11-09 DIAGNOSIS — N186 End stage renal disease: Secondary | ICD-10-CM | POA: Diagnosis not present

## 2017-11-09 DIAGNOSIS — D508 Other iron deficiency anemias: Secondary | ICD-10-CM | POA: Diagnosis not present

## 2017-11-09 DIAGNOSIS — I10 Essential (primary) hypertension: Secondary | ICD-10-CM | POA: Diagnosis not present

## 2017-11-10 DIAGNOSIS — D509 Iron deficiency anemia, unspecified: Secondary | ICD-10-CM | POA: Diagnosis not present

## 2017-11-10 DIAGNOSIS — N2581 Secondary hyperparathyroidism of renal origin: Secondary | ICD-10-CM | POA: Diagnosis not present

## 2017-11-10 DIAGNOSIS — N186 End stage renal disease: Secondary | ICD-10-CM | POA: Diagnosis not present

## 2017-11-10 DIAGNOSIS — D631 Anemia in chronic kidney disease: Secondary | ICD-10-CM | POA: Diagnosis not present

## 2017-11-11 DIAGNOSIS — N186 End stage renal disease: Secondary | ICD-10-CM | POA: Diagnosis not present

## 2017-11-11 DIAGNOSIS — N2581 Secondary hyperparathyroidism of renal origin: Secondary | ICD-10-CM | POA: Diagnosis not present

## 2017-11-11 DIAGNOSIS — D631 Anemia in chronic kidney disease: Secondary | ICD-10-CM | POA: Diagnosis not present

## 2017-11-11 DIAGNOSIS — D509 Iron deficiency anemia, unspecified: Secondary | ICD-10-CM | POA: Diagnosis not present

## 2017-11-12 DIAGNOSIS — D509 Iron deficiency anemia, unspecified: Secondary | ICD-10-CM | POA: Diagnosis not present

## 2017-11-12 DIAGNOSIS — N186 End stage renal disease: Secondary | ICD-10-CM | POA: Diagnosis not present

## 2017-11-12 DIAGNOSIS — D631 Anemia in chronic kidney disease: Secondary | ICD-10-CM | POA: Diagnosis not present

## 2017-11-12 DIAGNOSIS — N2581 Secondary hyperparathyroidism of renal origin: Secondary | ICD-10-CM | POA: Diagnosis not present

## 2017-11-14 DIAGNOSIS — N2581 Secondary hyperparathyroidism of renal origin: Secondary | ICD-10-CM | POA: Diagnosis not present

## 2017-11-14 DIAGNOSIS — N186 End stage renal disease: Secondary | ICD-10-CM | POA: Diagnosis not present

## 2017-11-14 DIAGNOSIS — D509 Iron deficiency anemia, unspecified: Secondary | ICD-10-CM | POA: Diagnosis not present

## 2017-11-14 DIAGNOSIS — D631 Anemia in chronic kidney disease: Secondary | ICD-10-CM | POA: Diagnosis not present

## 2017-11-15 DIAGNOSIS — N2581 Secondary hyperparathyroidism of renal origin: Secondary | ICD-10-CM | POA: Diagnosis not present

## 2017-11-15 DIAGNOSIS — D631 Anemia in chronic kidney disease: Secondary | ICD-10-CM | POA: Diagnosis not present

## 2017-11-15 DIAGNOSIS — D509 Iron deficiency anemia, unspecified: Secondary | ICD-10-CM | POA: Diagnosis not present

## 2017-11-15 DIAGNOSIS — N186 End stage renal disease: Secondary | ICD-10-CM | POA: Diagnosis not present

## 2017-11-16 DIAGNOSIS — N186 End stage renal disease: Secondary | ICD-10-CM | POA: Diagnosis not present

## 2017-11-16 DIAGNOSIS — D631 Anemia in chronic kidney disease: Secondary | ICD-10-CM | POA: Diagnosis not present

## 2017-11-16 DIAGNOSIS — N2581 Secondary hyperparathyroidism of renal origin: Secondary | ICD-10-CM | POA: Diagnosis not present

## 2017-11-16 DIAGNOSIS — D509 Iron deficiency anemia, unspecified: Secondary | ICD-10-CM | POA: Diagnosis not present

## 2017-11-17 DIAGNOSIS — D509 Iron deficiency anemia, unspecified: Secondary | ICD-10-CM | POA: Diagnosis not present

## 2017-11-17 DIAGNOSIS — N2581 Secondary hyperparathyroidism of renal origin: Secondary | ICD-10-CM | POA: Diagnosis not present

## 2017-11-17 DIAGNOSIS — N186 End stage renal disease: Secondary | ICD-10-CM | POA: Diagnosis not present

## 2017-11-17 DIAGNOSIS — D631 Anemia in chronic kidney disease: Secondary | ICD-10-CM | POA: Diagnosis not present

## 2017-11-18 DIAGNOSIS — N186 End stage renal disease: Secondary | ICD-10-CM | POA: Diagnosis not present

## 2017-11-18 DIAGNOSIS — N2581 Secondary hyperparathyroidism of renal origin: Secondary | ICD-10-CM | POA: Diagnosis not present

## 2017-11-18 DIAGNOSIS — D631 Anemia in chronic kidney disease: Secondary | ICD-10-CM | POA: Diagnosis not present

## 2017-11-18 DIAGNOSIS — D509 Iron deficiency anemia, unspecified: Secondary | ICD-10-CM | POA: Diagnosis not present

## 2017-11-19 DIAGNOSIS — N2581 Secondary hyperparathyroidism of renal origin: Secondary | ICD-10-CM | POA: Diagnosis not present

## 2017-11-19 DIAGNOSIS — N186 End stage renal disease: Secondary | ICD-10-CM | POA: Diagnosis not present

## 2017-11-19 DIAGNOSIS — D509 Iron deficiency anemia, unspecified: Secondary | ICD-10-CM | POA: Diagnosis not present

## 2017-11-19 DIAGNOSIS — D631 Anemia in chronic kidney disease: Secondary | ICD-10-CM | POA: Diagnosis not present

## 2017-11-20 DIAGNOSIS — D631 Anemia in chronic kidney disease: Secondary | ICD-10-CM | POA: Diagnosis not present

## 2017-11-20 DIAGNOSIS — D509 Iron deficiency anemia, unspecified: Secondary | ICD-10-CM | POA: Diagnosis not present

## 2017-11-20 DIAGNOSIS — N2581 Secondary hyperparathyroidism of renal origin: Secondary | ICD-10-CM | POA: Diagnosis not present

## 2017-11-20 DIAGNOSIS — N186 End stage renal disease: Secondary | ICD-10-CM | POA: Diagnosis not present

## 2017-11-21 DIAGNOSIS — D509 Iron deficiency anemia, unspecified: Secondary | ICD-10-CM | POA: Diagnosis not present

## 2017-11-21 DIAGNOSIS — N186 End stage renal disease: Secondary | ICD-10-CM | POA: Diagnosis not present

## 2017-11-21 DIAGNOSIS — D631 Anemia in chronic kidney disease: Secondary | ICD-10-CM | POA: Diagnosis not present

## 2017-11-21 DIAGNOSIS — N2581 Secondary hyperparathyroidism of renal origin: Secondary | ICD-10-CM | POA: Diagnosis not present

## 2017-11-22 DIAGNOSIS — N186 End stage renal disease: Secondary | ICD-10-CM | POA: Diagnosis not present

## 2017-11-22 DIAGNOSIS — D509 Iron deficiency anemia, unspecified: Secondary | ICD-10-CM | POA: Diagnosis not present

## 2017-11-22 DIAGNOSIS — D631 Anemia in chronic kidney disease: Secondary | ICD-10-CM | POA: Diagnosis not present

## 2017-11-22 DIAGNOSIS — N2581 Secondary hyperparathyroidism of renal origin: Secondary | ICD-10-CM | POA: Diagnosis not present

## 2017-11-23 DIAGNOSIS — D631 Anemia in chronic kidney disease: Secondary | ICD-10-CM | POA: Diagnosis not present

## 2017-11-23 DIAGNOSIS — D509 Iron deficiency anemia, unspecified: Secondary | ICD-10-CM | POA: Diagnosis not present

## 2017-11-23 DIAGNOSIS — N186 End stage renal disease: Secondary | ICD-10-CM | POA: Diagnosis not present

## 2017-11-23 DIAGNOSIS — N2581 Secondary hyperparathyroidism of renal origin: Secondary | ICD-10-CM | POA: Diagnosis not present

## 2017-11-23 DIAGNOSIS — Z992 Dependence on renal dialysis: Secondary | ICD-10-CM | POA: Diagnosis not present

## 2017-11-24 DIAGNOSIS — Z418 Encounter for other procedures for purposes other than remedying health state: Secondary | ICD-10-CM | POA: Diagnosis not present

## 2017-11-24 DIAGNOSIS — N186 End stage renal disease: Secondary | ICD-10-CM | POA: Diagnosis not present

## 2017-11-24 DIAGNOSIS — D631 Anemia in chronic kidney disease: Secondary | ICD-10-CM | POA: Diagnosis not present

## 2017-11-24 DIAGNOSIS — N2581 Secondary hyperparathyroidism of renal origin: Secondary | ICD-10-CM | POA: Diagnosis not present

## 2017-11-25 DIAGNOSIS — N186 End stage renal disease: Secondary | ICD-10-CM | POA: Diagnosis not present

## 2017-11-25 DIAGNOSIS — D631 Anemia in chronic kidney disease: Secondary | ICD-10-CM | POA: Diagnosis not present

## 2017-11-25 DIAGNOSIS — Z418 Encounter for other procedures for purposes other than remedying health state: Secondary | ICD-10-CM | POA: Diagnosis not present

## 2017-11-25 DIAGNOSIS — N2581 Secondary hyperparathyroidism of renal origin: Secondary | ICD-10-CM | POA: Diagnosis not present

## 2017-11-26 ENCOUNTER — Other Ambulatory Visit: Payer: Self-pay | Admitting: Sports Medicine

## 2017-11-26 DIAGNOSIS — Z418 Encounter for other procedures for purposes other than remedying health state: Secondary | ICD-10-CM | POA: Diagnosis not present

## 2017-11-26 DIAGNOSIS — N186 End stage renal disease: Secondary | ICD-10-CM | POA: Diagnosis not present

## 2017-11-26 DIAGNOSIS — N2581 Secondary hyperparathyroidism of renal origin: Secondary | ICD-10-CM | POA: Diagnosis not present

## 2017-11-26 DIAGNOSIS — D631 Anemia in chronic kidney disease: Secondary | ICD-10-CM | POA: Diagnosis not present

## 2017-11-27 DIAGNOSIS — D631 Anemia in chronic kidney disease: Secondary | ICD-10-CM | POA: Diagnosis not present

## 2017-11-27 DIAGNOSIS — N186 End stage renal disease: Secondary | ICD-10-CM | POA: Diagnosis not present

## 2017-11-27 DIAGNOSIS — Z418 Encounter for other procedures for purposes other than remedying health state: Secondary | ICD-10-CM | POA: Diagnosis not present

## 2017-11-27 DIAGNOSIS — N2581 Secondary hyperparathyroidism of renal origin: Secondary | ICD-10-CM | POA: Diagnosis not present

## 2017-11-28 DIAGNOSIS — D631 Anemia in chronic kidney disease: Secondary | ICD-10-CM | POA: Diagnosis not present

## 2017-11-28 DIAGNOSIS — N186 End stage renal disease: Secondary | ICD-10-CM | POA: Diagnosis not present

## 2017-11-28 DIAGNOSIS — N2581 Secondary hyperparathyroidism of renal origin: Secondary | ICD-10-CM | POA: Diagnosis not present

## 2017-11-28 DIAGNOSIS — Z418 Encounter for other procedures for purposes other than remedying health state: Secondary | ICD-10-CM | POA: Diagnosis not present

## 2017-11-29 DIAGNOSIS — N186 End stage renal disease: Secondary | ICD-10-CM | POA: Diagnosis not present

## 2017-11-29 DIAGNOSIS — D631 Anemia in chronic kidney disease: Secondary | ICD-10-CM | POA: Diagnosis not present

## 2017-11-29 DIAGNOSIS — Z418 Encounter for other procedures for purposes other than remedying health state: Secondary | ICD-10-CM | POA: Diagnosis not present

## 2017-11-29 DIAGNOSIS — N2581 Secondary hyperparathyroidism of renal origin: Secondary | ICD-10-CM | POA: Diagnosis not present

## 2017-11-30 DIAGNOSIS — Z418 Encounter for other procedures for purposes other than remedying health state: Secondary | ICD-10-CM | POA: Diagnosis not present

## 2017-11-30 DIAGNOSIS — D631 Anemia in chronic kidney disease: Secondary | ICD-10-CM | POA: Diagnosis not present

## 2017-11-30 DIAGNOSIS — N2581 Secondary hyperparathyroidism of renal origin: Secondary | ICD-10-CM | POA: Diagnosis not present

## 2017-11-30 DIAGNOSIS — N186 End stage renal disease: Secondary | ICD-10-CM | POA: Diagnosis not present

## 2017-12-01 DIAGNOSIS — E118 Type 2 diabetes mellitus with unspecified complications: Secondary | ICD-10-CM | POA: Diagnosis not present

## 2017-12-01 DIAGNOSIS — Z418 Encounter for other procedures for purposes other than remedying health state: Secondary | ICD-10-CM | POA: Diagnosis not present

## 2017-12-01 DIAGNOSIS — D631 Anemia in chronic kidney disease: Secondary | ICD-10-CM | POA: Diagnosis not present

## 2017-12-01 DIAGNOSIS — Z4932 Encounter for adequacy testing for peritoneal dialysis: Secondary | ICD-10-CM | POA: Diagnosis not present

## 2017-12-01 DIAGNOSIS — N186 End stage renal disease: Secondary | ICD-10-CM | POA: Diagnosis not present

## 2017-12-01 DIAGNOSIS — N2581 Secondary hyperparathyroidism of renal origin: Secondary | ICD-10-CM | POA: Diagnosis not present

## 2017-12-02 DIAGNOSIS — N186 End stage renal disease: Secondary | ICD-10-CM | POA: Diagnosis not present

## 2017-12-02 DIAGNOSIS — D631 Anemia in chronic kidney disease: Secondary | ICD-10-CM | POA: Diagnosis not present

## 2017-12-02 DIAGNOSIS — Z418 Encounter for other procedures for purposes other than remedying health state: Secondary | ICD-10-CM | POA: Diagnosis not present

## 2017-12-02 DIAGNOSIS — N2581 Secondary hyperparathyroidism of renal origin: Secondary | ICD-10-CM | POA: Diagnosis not present

## 2017-12-03 ENCOUNTER — Ambulatory Visit (INDEPENDENT_AMBULATORY_CARE_PROVIDER_SITE_OTHER): Payer: Medicare Other | Admitting: Sports Medicine

## 2017-12-03 ENCOUNTER — Encounter: Payer: Self-pay | Admitting: Sports Medicine

## 2017-12-03 DIAGNOSIS — M79671 Pain in right foot: Secondary | ICD-10-CM

## 2017-12-03 DIAGNOSIS — D631 Anemia in chronic kidney disease: Secondary | ICD-10-CM | POA: Diagnosis not present

## 2017-12-03 DIAGNOSIS — M79672 Pain in left foot: Secondary | ICD-10-CM

## 2017-12-03 DIAGNOSIS — M1120 Other chondrocalcinosis, unspecified site: Secondary | ICD-10-CM | POA: Diagnosis not present

## 2017-12-03 DIAGNOSIS — Z418 Encounter for other procedures for purposes other than remedying health state: Secondary | ICD-10-CM | POA: Diagnosis not present

## 2017-12-03 DIAGNOSIS — N2581 Secondary hyperparathyroidism of renal origin: Secondary | ICD-10-CM | POA: Diagnosis not present

## 2017-12-03 DIAGNOSIS — N186 End stage renal disease: Secondary | ICD-10-CM | POA: Diagnosis not present

## 2017-12-03 NOTE — Progress Notes (Addendum)
Subjective:    CC: Bilateral foot pain  HPI: Brian Rose is a pleasant 77 year old male with end-stage renal disease currently being evaluated for renal transplant.  For the past several days he said increasing pain and swelling over the left and right ankles, fairly symmetric.  No trauma, no changes in diet.  Symptoms are moderate, worsening.  I reviewed the past medical history, family history, social history, surgical history, and allergies today and no changes were needed.  Please see the problem list section below in epic for further details.  Past Medical History: Past Medical History:  Diagnosis Date  . Chronic kidney disease   . Diabetes ([place])   . Hypertension    Past Surgical History: No past surgical history on file. Social History: Social History   Socioeconomic History  . Marital status: Married    Spouse name: Not on file  . Number of children: Not on file  . Years of education: Not on file  . Highest education level: Not on file  Occupational History  . Not on file  Social Needs  . Financial resource strain: Not on file  . Food insecurity:    Worry: Not on file    Inability: Not on file  . Transportation needs:    Medical: Not on file    Non-medical: Not on file  Tobacco Use  . Smoking status: Former Smoker    Packs/day: 1.00    Years: 20.00    Pack years: 20.00    Types: Cigars    Last attempt to quit: 09/06/1981    Years since quitting: 36.2  . Smokeless tobacco: Never Used  Substance and Sexual Activity  . Alcohol use: No  . Drug use: No  . Sexual activity: Yes    Partners: Female  Lifestyle  . Physical activity:    Days per week: Not on file    Minutes per session: Not on file  . Stress: Not on file  Relationships  . Social connections:    Talks on phone: Not on file    Gets together: Not on file    Attends religious service: Not on file    Active member of club or organization: Not on file    Attends meetings of clubs or organizations: Not  on file    Relationship status: Not on file  Other Topics Concern  . Not on file  Social History Narrative  . Not on file   Family History: Family History  Problem Relation Age of Onset  . Diabetes Mother   . Hypertension Mother   . Hypertension Father   . Heart attack Father 68       This is what he died from.  . Diabetes Sister   . Diabetes Brother   . Diabetes Brother   . Diabetes Sister    Allergies: Allergies  Allergen Reactions  . Hydralazine Hypertension    Odd worsening of HTN.   Medications: See med rec.  Review of Systems: No fevers, chills, night sweats, weight loss, chest pain, or shortness of breath.   Objective:    General: Well Developed, well nourished, and in no acute distress.  Neuro: Alert and oriented x3, extra-ocular muscles intact, sensation grossly intact.  HEENT: Normocephalic, atraumatic, pupils equal round reactive to light, neck supple, no masses, no lymphadenopathy, thyroid nonpalpable.  Skin: Warm and dry, no rashes. Cardiac: Regular rate and rhythm, no murmurs rubs or gallops, no lower extremity edema.  Respiratory: Clear to auscultation bilaterally. Not using accessory muscles,  speaking in full sentences. Ankles: Both ankles are swollen, with pitting edema as well as tenderness over the dorsal forefoot and tibiotalar joints.  Procedure: Real-time Ultrasound Guided aspiration/injection of left extensor digitorum longus tendon sheath. Device: GE Logiq E  Verbal informed consent obtained.  Time-out conducted.  Noted no overlying erythema, induration, or other signs of local infection.  Skin prepped in a sterile fashion.  Local anesthesia: Topical Ethyl chloride.  With sterile technique and under real time ultrasound guidance: Using an 18-gauge needle I was able to aspirate 0.3 cc of thick, straw-colored fluid, syringe switched and 1 cc Kenalog 40, 1 cc lidocaine injected easily. Completed without difficulty  Pain immediately resolved  suggesting accurate placement of the medication.  Advised to call if fevers/chills, erythema, induration, drainage, or persistent bleeding.  Images permanently stored and available for review in the ultrasound unit.  Impression: Technically successful ultrasound guided injection.  Procedure: Real-time Ultrasound Guided aspiration/injection of right extensor digitorum longus tendon sheath. Device: GE Logiq E  Verbal informed consent obtained.  Time-out conducted.  Noted no overlying erythema, induration, or other signs of local infection.  Skin prepped in a sterile fashion.  Local anesthesia: Topical Ethyl chloride.  With sterile technique and under real time ultrasound guidance: Using an 18-gauge needle I was able to aspirate 0.3 cc of thick, straw-colored fluid, syringe switched and 1 cc Kenalog 40, 1 cc lidocaine injected easily. Completed without difficulty  Pain immediately resolved suggesting accurate placement of the medication.  Advised to call if fevers/chills, erythema, induration, drainage, or persistent bleeding.  Images permanently stored and available for review in the ultrasound unit.  Impression: Technically successful ultrasound guided injection.  Both ankles/feet were strap with compressive dressings.  Impression and Recommendations:    Bilateral foot pain Extensor digitorum longus tendon sheath effusions. We aspirated and injected both of these, crystal analysis for both. Both ankles were strapped with a compressive dressing.   Return in 1 month, we will further discuss why his feet feel cold at that time. There was extensive lower extremity edema as well, he will add lower extremity compression hose.  Pseudogout Arthrocentesis shows calcium pyrophosphate crystals indicating that he does not have gout, but actually pseudogout.  This can be treated with occasional injections as we will prefer to avoid colchicine for now with his renal disease.  Because this is not gout  and I do not see any evidence of an elevated uric acid level in his chart with [place], [facility], or [facility], and I have never done an arthrocentesis showing uric acid crystals we are going to discontinue allopurinol.  I do not think this is doing anything. ___________________________________________ Gwen Her. Dianah Field, M.D., ABFM., CAQSM. Primary Care and [place] Instructor of [place] of [facility] of Medicine

## 2017-12-03 NOTE — Assessment & Plan Note (Signed)
Extensor digitorum longus tendon sheath effusions. We aspirated and injected both of these, crystal analysis for both. Both ankles were strapped with a compressive dressing.   Return in 1 month, we will further discuss why his feet feel cold at that time. There was extensive lower extremity edema as well, he will add lower extremity compression hose.

## 2017-12-04 DIAGNOSIS — D631 Anemia in chronic kidney disease: Secondary | ICD-10-CM | POA: Diagnosis not present

## 2017-12-04 DIAGNOSIS — N2581 Secondary hyperparathyroidism of renal origin: Secondary | ICD-10-CM | POA: Diagnosis not present

## 2017-12-04 DIAGNOSIS — I10 Essential (primary) hypertension: Secondary | ICD-10-CM | POA: Diagnosis not present

## 2017-12-04 DIAGNOSIS — N186 End stage renal disease: Secondary | ICD-10-CM | POA: Diagnosis not present

## 2017-12-04 DIAGNOSIS — Z418 Encounter for other procedures for purposes other than remedying health state: Secondary | ICD-10-CM | POA: Diagnosis not present

## 2017-12-04 DIAGNOSIS — N529 Male erectile dysfunction, unspecified: Secondary | ICD-10-CM | POA: Diagnosis not present

## 2017-12-04 LAB — SYNOVIAL FLUID, CRYSTAL

## 2017-12-04 NOTE — Assessment & Plan Note (Signed)
Arthrocentesis shows calcium pyrophosphate crystals indicating that he does not have gout, but actually pseudogout.  This can be treated with occasional injections as we will prefer to avoid colchicine for now with his renal disease.  Because this is not gout and I do not see any evidence of an elevated uric acid level in his chart with [place], [facility], or [facility], and I have never done an arthrocentesis showing uric acid crystals we are going to discontinue allopurinol.  I do not think this is doing anything.

## 2017-12-04 NOTE — Addendum Note (Signed)
Addended by: Silverio Decamp on: 12/04/2017 01:35 PM   Modules accepted: Orders

## 2017-12-05 DIAGNOSIS — Z418 Encounter for other procedures for purposes other than remedying health state: Secondary | ICD-10-CM | POA: Diagnosis not present

## 2017-12-05 DIAGNOSIS — N2581 Secondary hyperparathyroidism of renal origin: Secondary | ICD-10-CM | POA: Diagnosis not present

## 2017-12-05 DIAGNOSIS — D631 Anemia in chronic kidney disease: Secondary | ICD-10-CM | POA: Diagnosis not present

## 2017-12-05 DIAGNOSIS — N186 End stage renal disease: Secondary | ICD-10-CM | POA: Diagnosis not present

## 2017-12-06 DIAGNOSIS — N186 End stage renal disease: Secondary | ICD-10-CM | POA: Diagnosis not present

## 2017-12-06 DIAGNOSIS — N2581 Secondary hyperparathyroidism of renal origin: Secondary | ICD-10-CM | POA: Diagnosis not present

## 2017-12-06 DIAGNOSIS — D631 Anemia in chronic kidney disease: Secondary | ICD-10-CM | POA: Diagnosis not present

## 2017-12-06 DIAGNOSIS — Z418 Encounter for other procedures for purposes other than remedying health state: Secondary | ICD-10-CM | POA: Diagnosis not present

## 2017-12-06 LAB — SYNOVIAL FLUID, CRYSTAL

## 2017-12-07 DIAGNOSIS — N2581 Secondary hyperparathyroidism of renal origin: Secondary | ICD-10-CM | POA: Diagnosis not present

## 2017-12-07 DIAGNOSIS — N186 End stage renal disease: Secondary | ICD-10-CM | POA: Diagnosis not present

## 2017-12-07 DIAGNOSIS — D631 Anemia in chronic kidney disease: Secondary | ICD-10-CM | POA: Diagnosis not present

## 2017-12-07 DIAGNOSIS — Z418 Encounter for other procedures for purposes other than remedying health state: Secondary | ICD-10-CM | POA: Diagnosis not present

## 2017-12-08 DIAGNOSIS — N2581 Secondary hyperparathyroidism of renal origin: Secondary | ICD-10-CM | POA: Diagnosis not present

## 2017-12-08 DIAGNOSIS — Z418 Encounter for other procedures for purposes other than remedying health state: Secondary | ICD-10-CM | POA: Diagnosis not present

## 2017-12-08 DIAGNOSIS — D631 Anemia in chronic kidney disease: Secondary | ICD-10-CM | POA: Diagnosis not present

## 2017-12-08 DIAGNOSIS — N186 End stage renal disease: Secondary | ICD-10-CM | POA: Diagnosis not present

## 2017-12-09 DIAGNOSIS — N186 End stage renal disease: Secondary | ICD-10-CM | POA: Diagnosis not present

## 2017-12-09 DIAGNOSIS — D631 Anemia in chronic kidney disease: Secondary | ICD-10-CM | POA: Diagnosis not present

## 2017-12-09 DIAGNOSIS — Z418 Encounter for other procedures for purposes other than remedying health state: Secondary | ICD-10-CM | POA: Diagnosis not present

## 2017-12-09 DIAGNOSIS — N2581 Secondary hyperparathyroidism of renal origin: Secondary | ICD-10-CM | POA: Diagnosis not present

## 2017-12-10 DIAGNOSIS — D631 Anemia in chronic kidney disease: Secondary | ICD-10-CM | POA: Diagnosis not present

## 2017-12-10 DIAGNOSIS — N2581 Secondary hyperparathyroidism of renal origin: Secondary | ICD-10-CM | POA: Diagnosis not present

## 2017-12-10 DIAGNOSIS — Z418 Encounter for other procedures for purposes other than remedying health state: Secondary | ICD-10-CM | POA: Diagnosis not present

## 2017-12-10 DIAGNOSIS — N186 End stage renal disease: Secondary | ICD-10-CM | POA: Diagnosis not present

## 2017-12-11 DIAGNOSIS — D631 Anemia in chronic kidney disease: Secondary | ICD-10-CM | POA: Diagnosis not present

## 2017-12-11 DIAGNOSIS — Z418 Encounter for other procedures for purposes other than remedying health state: Secondary | ICD-10-CM | POA: Diagnosis not present

## 2017-12-11 DIAGNOSIS — N186 End stage renal disease: Secondary | ICD-10-CM | POA: Diagnosis not present

## 2017-12-11 DIAGNOSIS — N2581 Secondary hyperparathyroidism of renal origin: Secondary | ICD-10-CM | POA: Diagnosis not present

## 2017-12-12 DIAGNOSIS — N2581 Secondary hyperparathyroidism of renal origin: Secondary | ICD-10-CM | POA: Diagnosis not present

## 2017-12-12 DIAGNOSIS — Z418 Encounter for other procedures for purposes other than remedying health state: Secondary | ICD-10-CM | POA: Diagnosis not present

## 2017-12-12 DIAGNOSIS — N186 End stage renal disease: Secondary | ICD-10-CM | POA: Diagnosis not present

## 2017-12-12 DIAGNOSIS — D631 Anemia in chronic kidney disease: Secondary | ICD-10-CM | POA: Diagnosis not present

## 2017-12-13 DIAGNOSIS — Z418 Encounter for other procedures for purposes other than remedying health state: Secondary | ICD-10-CM | POA: Diagnosis not present

## 2017-12-13 DIAGNOSIS — D631 Anemia in chronic kidney disease: Secondary | ICD-10-CM | POA: Diagnosis not present

## 2017-12-13 DIAGNOSIS — N186 End stage renal disease: Secondary | ICD-10-CM | POA: Diagnosis not present

## 2017-12-13 DIAGNOSIS — N2581 Secondary hyperparathyroidism of renal origin: Secondary | ICD-10-CM | POA: Diagnosis not present

## 2017-12-14 DIAGNOSIS — N186 End stage renal disease: Secondary | ICD-10-CM | POA: Diagnosis not present

## 2017-12-14 DIAGNOSIS — Z418 Encounter for other procedures for purposes other than remedying health state: Secondary | ICD-10-CM | POA: Diagnosis not present

## 2017-12-14 DIAGNOSIS — N2581 Secondary hyperparathyroidism of renal origin: Secondary | ICD-10-CM | POA: Diagnosis not present

## 2017-12-14 DIAGNOSIS — D631 Anemia in chronic kidney disease: Secondary | ICD-10-CM | POA: Diagnosis not present

## 2017-12-15 DIAGNOSIS — Z418 Encounter for other procedures for purposes other than remedying health state: Secondary | ICD-10-CM | POA: Diagnosis not present

## 2017-12-15 DIAGNOSIS — D631 Anemia in chronic kidney disease: Secondary | ICD-10-CM | POA: Diagnosis not present

## 2017-12-15 DIAGNOSIS — N2581 Secondary hyperparathyroidism of renal origin: Secondary | ICD-10-CM | POA: Diagnosis not present

## 2017-12-15 DIAGNOSIS — N186 End stage renal disease: Secondary | ICD-10-CM | POA: Diagnosis not present

## 2017-12-16 DIAGNOSIS — Z418 Encounter for other procedures for purposes other than remedying health state: Secondary | ICD-10-CM | POA: Diagnosis not present

## 2017-12-16 DIAGNOSIS — D631 Anemia in chronic kidney disease: Secondary | ICD-10-CM | POA: Diagnosis not present

## 2017-12-16 DIAGNOSIS — N186 End stage renal disease: Secondary | ICD-10-CM | POA: Diagnosis not present

## 2017-12-16 DIAGNOSIS — N2581 Secondary hyperparathyroidism of renal origin: Secondary | ICD-10-CM | POA: Diagnosis not present

## 2017-12-17 DIAGNOSIS — N186 End stage renal disease: Secondary | ICD-10-CM | POA: Diagnosis not present

## 2017-12-17 DIAGNOSIS — N2581 Secondary hyperparathyroidism of renal origin: Secondary | ICD-10-CM | POA: Diagnosis not present

## 2017-12-17 DIAGNOSIS — Z418 Encounter for other procedures for purposes other than remedying health state: Secondary | ICD-10-CM | POA: Diagnosis not present

## 2017-12-17 DIAGNOSIS — D631 Anemia in chronic kidney disease: Secondary | ICD-10-CM | POA: Diagnosis not present

## 2017-12-18 DIAGNOSIS — Z418 Encounter for other procedures for purposes other than remedying health state: Secondary | ICD-10-CM | POA: Diagnosis not present

## 2017-12-18 DIAGNOSIS — N186 End stage renal disease: Secondary | ICD-10-CM | POA: Diagnosis not present

## 2017-12-18 DIAGNOSIS — D631 Anemia in chronic kidney disease: Secondary | ICD-10-CM | POA: Diagnosis not present

## 2017-12-18 DIAGNOSIS — N2581 Secondary hyperparathyroidism of renal origin: Secondary | ICD-10-CM | POA: Diagnosis not present

## 2017-12-19 DIAGNOSIS — D631 Anemia in chronic kidney disease: Secondary | ICD-10-CM | POA: Diagnosis not present

## 2017-12-19 DIAGNOSIS — N186 End stage renal disease: Secondary | ICD-10-CM | POA: Diagnosis not present

## 2017-12-19 DIAGNOSIS — Z418 Encounter for other procedures for purposes other than remedying health state: Secondary | ICD-10-CM | POA: Diagnosis not present

## 2017-12-19 DIAGNOSIS — N2581 Secondary hyperparathyroidism of renal origin: Secondary | ICD-10-CM | POA: Diagnosis not present

## 2017-12-20 DIAGNOSIS — D631 Anemia in chronic kidney disease: Secondary | ICD-10-CM | POA: Diagnosis not present

## 2017-12-20 DIAGNOSIS — N186 End stage renal disease: Secondary | ICD-10-CM | POA: Diagnosis not present

## 2017-12-20 DIAGNOSIS — N2581 Secondary hyperparathyroidism of renal origin: Secondary | ICD-10-CM | POA: Diagnosis not present

## 2017-12-20 DIAGNOSIS — Z418 Encounter for other procedures for purposes other than remedying health state: Secondary | ICD-10-CM | POA: Diagnosis not present

## 2017-12-21 DIAGNOSIS — Z418 Encounter for other procedures for purposes other than remedying health state: Secondary | ICD-10-CM | POA: Diagnosis not present

## 2017-12-21 DIAGNOSIS — N2581 Secondary hyperparathyroidism of renal origin: Secondary | ICD-10-CM | POA: Diagnosis not present

## 2017-12-21 DIAGNOSIS — D631 Anemia in chronic kidney disease: Secondary | ICD-10-CM | POA: Diagnosis not present

## 2017-12-21 DIAGNOSIS — N186 End stage renal disease: Secondary | ICD-10-CM | POA: Diagnosis not present

## 2017-12-22 DIAGNOSIS — D631 Anemia in chronic kidney disease: Secondary | ICD-10-CM | POA: Diagnosis not present

## 2017-12-22 DIAGNOSIS — D508 Other iron deficiency anemias: Secondary | ICD-10-CM | POA: Diagnosis not present

## 2017-12-22 DIAGNOSIS — N186 End stage renal disease: Secondary | ICD-10-CM | POA: Diagnosis not present

## 2017-12-22 DIAGNOSIS — Z418 Encounter for other procedures for purposes other than remedying health state: Secondary | ICD-10-CM | POA: Diagnosis not present

## 2017-12-22 DIAGNOSIS — N2581 Secondary hyperparathyroidism of renal origin: Secondary | ICD-10-CM | POA: Diagnosis not present

## 2017-12-22 DIAGNOSIS — I10 Essential (primary) hypertension: Secondary | ICD-10-CM | POA: Diagnosis not present

## 2017-12-22 DIAGNOSIS — R300 Dysuria: Secondary | ICD-10-CM | POA: Diagnosis not present

## 2017-12-23 DIAGNOSIS — Z418 Encounter for other procedures for purposes other than remedying health state: Secondary | ICD-10-CM | POA: Diagnosis not present

## 2017-12-23 DIAGNOSIS — N2581 Secondary hyperparathyroidism of renal origin: Secondary | ICD-10-CM | POA: Diagnosis not present

## 2017-12-23 DIAGNOSIS — N186 End stage renal disease: Secondary | ICD-10-CM | POA: Diagnosis not present

## 2017-12-23 DIAGNOSIS — D631 Anemia in chronic kidney disease: Secondary | ICD-10-CM | POA: Diagnosis not present

## 2017-12-24 DIAGNOSIS — D631 Anemia in chronic kidney disease: Secondary | ICD-10-CM | POA: Diagnosis not present

## 2017-12-24 DIAGNOSIS — Z992 Dependence on renal dialysis: Secondary | ICD-10-CM | POA: Diagnosis not present

## 2017-12-24 DIAGNOSIS — N186 End stage renal disease: Secondary | ICD-10-CM | POA: Diagnosis not present

## 2017-12-24 DIAGNOSIS — N2581 Secondary hyperparathyroidism of renal origin: Secondary | ICD-10-CM | POA: Diagnosis not present

## 2017-12-24 DIAGNOSIS — Z418 Encounter for other procedures for purposes other than remedying health state: Secondary | ICD-10-CM | POA: Diagnosis not present

## 2017-12-25 DIAGNOSIS — N186 End stage renal disease: Secondary | ICD-10-CM | POA: Diagnosis not present

## 2017-12-25 DIAGNOSIS — Z23 Encounter for immunization: Secondary | ICD-10-CM | POA: Diagnosis not present

## 2017-12-25 DIAGNOSIS — D631 Anemia in chronic kidney disease: Secondary | ICD-10-CM | POA: Diagnosis not present

## 2017-12-25 DIAGNOSIS — N2581 Secondary hyperparathyroidism of renal origin: Secondary | ICD-10-CM | POA: Diagnosis not present

## 2017-12-26 DIAGNOSIS — N186 End stage renal disease: Secondary | ICD-10-CM | POA: Diagnosis not present

## 2017-12-26 DIAGNOSIS — D631 Anemia in chronic kidney disease: Secondary | ICD-10-CM | POA: Diagnosis not present

## 2017-12-26 DIAGNOSIS — N2581 Secondary hyperparathyroidism of renal origin: Secondary | ICD-10-CM | POA: Diagnosis not present

## 2017-12-26 DIAGNOSIS — Z23 Encounter for immunization: Secondary | ICD-10-CM | POA: Diagnosis not present

## 2017-12-27 DIAGNOSIS — N2581 Secondary hyperparathyroidism of renal origin: Secondary | ICD-10-CM | POA: Diagnosis not present

## 2017-12-27 DIAGNOSIS — D631 Anemia in chronic kidney disease: Secondary | ICD-10-CM | POA: Diagnosis not present

## 2017-12-27 DIAGNOSIS — Z23 Encounter for immunization: Secondary | ICD-10-CM | POA: Diagnosis not present

## 2017-12-27 DIAGNOSIS — N186 End stage renal disease: Secondary | ICD-10-CM | POA: Diagnosis not present

## 2017-12-28 DIAGNOSIS — N2581 Secondary hyperparathyroidism of renal origin: Secondary | ICD-10-CM | POA: Diagnosis not present

## 2017-12-28 DIAGNOSIS — Z23 Encounter for immunization: Secondary | ICD-10-CM | POA: Diagnosis not present

## 2017-12-28 DIAGNOSIS — N186 End stage renal disease: Secondary | ICD-10-CM | POA: Diagnosis not present

## 2017-12-28 DIAGNOSIS — D631 Anemia in chronic kidney disease: Secondary | ICD-10-CM | POA: Diagnosis not present

## 2017-12-29 DIAGNOSIS — I120 Hypertensive chronic kidney disease with stage 5 chronic kidney disease or end stage renal disease: Secondary | ICD-10-CM | POA: Diagnosis not present

## 2017-12-29 DIAGNOSIS — E1122 Type 2 diabetes mellitus with diabetic chronic kidney disease: Secondary | ICD-10-CM | POA: Diagnosis not present

## 2017-12-29 DIAGNOSIS — N186 End stage renal disease: Secondary | ICD-10-CM | POA: Diagnosis not present

## 2017-12-29 DIAGNOSIS — N412 Abscess of prostate: Secondary | ICD-10-CM | POA: Diagnosis not present

## 2017-12-29 DIAGNOSIS — N411 Chronic prostatitis: Secondary | ICD-10-CM | POA: Diagnosis not present

## 2017-12-29 DIAGNOSIS — N50819 Testicular pain, unspecified: Secondary | ICD-10-CM | POA: Diagnosis not present

## 2017-12-29 DIAGNOSIS — N400 Enlarged prostate without lower urinary tract symptoms: Secondary | ICD-10-CM | POA: Diagnosis not present

## 2017-12-29 DIAGNOSIS — N390 Urinary tract infection, site not specified: Secondary | ICD-10-CM | POA: Diagnosis not present

## 2017-12-29 DIAGNOSIS — N2581 Secondary hyperparathyroidism of renal origin: Secondary | ICD-10-CM | POA: Diagnosis not present

## 2017-12-29 DIAGNOSIS — N410 Acute prostatitis: Secondary | ICD-10-CM | POA: Diagnosis not present

## 2017-12-29 DIAGNOSIS — R319 Hematuria, unspecified: Secondary | ICD-10-CM | POA: Diagnosis not present

## 2017-12-29 DIAGNOSIS — R188 Other ascites: Secondary | ICD-10-CM | POA: Diagnosis not present

## 2017-12-29 DIAGNOSIS — N3289 Other specified disorders of bladder: Secondary | ICD-10-CM | POA: Diagnosis not present

## 2017-12-29 DIAGNOSIS — Z992 Dependence on renal dialysis: Secondary | ICD-10-CM | POA: Diagnosis not present

## 2017-12-30 DIAGNOSIS — N412 Abscess of prostate: Secondary | ICD-10-CM | POA: Diagnosis not present

## 2017-12-30 DIAGNOSIS — Z8673 Personal history of transient ischemic attack (TIA), and cerebral infarction without residual deficits: Secondary | ICD-10-CM | POA: Diagnosis not present

## 2017-12-30 DIAGNOSIS — N186 End stage renal disease: Secondary | ICD-10-CM | POA: Diagnosis not present

## 2017-12-30 DIAGNOSIS — Z992 Dependence on renal dialysis: Secondary | ICD-10-CM | POA: Diagnosis not present

## 2017-12-30 DIAGNOSIS — I120 Hypertensive chronic kidney disease with stage 5 chronic kidney disease or end stage renal disease: Secondary | ICD-10-CM | POA: Diagnosis present

## 2017-12-30 DIAGNOSIS — N2581 Secondary hyperparathyroidism of renal origin: Secondary | ICD-10-CM | POA: Diagnosis present

## 2017-12-30 DIAGNOSIS — R102 Pelvic and perineal pain: Secondary | ICD-10-CM | POA: Diagnosis not present

## 2017-12-30 DIAGNOSIS — D509 Iron deficiency anemia, unspecified: Secondary | ICD-10-CM | POA: Diagnosis not present

## 2017-12-30 DIAGNOSIS — E1122 Type 2 diabetes mellitus with diabetic chronic kidney disease: Secondary | ICD-10-CM | POA: Diagnosis present

## 2017-12-30 DIAGNOSIS — Z7982 Long term (current) use of aspirin: Secondary | ICD-10-CM | POA: Diagnosis not present

## 2017-12-30 DIAGNOSIS — I10 Essential (primary) hypertension: Secondary | ICD-10-CM | POA: Diagnosis not present

## 2017-12-30 DIAGNOSIS — Z79899 Other long term (current) drug therapy: Secondary | ICD-10-CM | POA: Diagnosis not present

## 2017-12-30 DIAGNOSIS — D631 Anemia in chronic kidney disease: Secondary | ICD-10-CM | POA: Diagnosis present

## 2017-12-30 DIAGNOSIS — Z23 Encounter for immunization: Secondary | ICD-10-CM | POA: Diagnosis not present

## 2018-01-01 DIAGNOSIS — Z23 Encounter for immunization: Secondary | ICD-10-CM | POA: Diagnosis not present

## 2018-01-01 DIAGNOSIS — N186 End stage renal disease: Secondary | ICD-10-CM | POA: Diagnosis not present

## 2018-01-01 DIAGNOSIS — N2581 Secondary hyperparathyroidism of renal origin: Secondary | ICD-10-CM | POA: Diagnosis not present

## 2018-01-01 DIAGNOSIS — D631 Anemia in chronic kidney disease: Secondary | ICD-10-CM | POA: Diagnosis not present

## 2018-01-02 DIAGNOSIS — D631 Anemia in chronic kidney disease: Secondary | ICD-10-CM | POA: Diagnosis not present

## 2018-01-02 DIAGNOSIS — N186 End stage renal disease: Secondary | ICD-10-CM | POA: Diagnosis not present

## 2018-01-02 DIAGNOSIS — Z23 Encounter for immunization: Secondary | ICD-10-CM | POA: Diagnosis not present

## 2018-01-02 DIAGNOSIS — N2581 Secondary hyperparathyroidism of renal origin: Secondary | ICD-10-CM | POA: Diagnosis not present

## 2018-01-03 DIAGNOSIS — Z23 Encounter for immunization: Secondary | ICD-10-CM | POA: Diagnosis not present

## 2018-01-03 DIAGNOSIS — N186 End stage renal disease: Secondary | ICD-10-CM | POA: Diagnosis not present

## 2018-01-03 DIAGNOSIS — N2581 Secondary hyperparathyroidism of renal origin: Secondary | ICD-10-CM | POA: Diagnosis not present

## 2018-01-03 DIAGNOSIS — D631 Anemia in chronic kidney disease: Secondary | ICD-10-CM | POA: Diagnosis not present

## 2018-01-04 ENCOUNTER — Ambulatory Visit: Payer: Medicare Other | Admitting: Sports Medicine

## 2018-01-04 DIAGNOSIS — Z23 Encounter for immunization: Secondary | ICD-10-CM | POA: Diagnosis not present

## 2018-01-04 DIAGNOSIS — D631 Anemia in chronic kidney disease: Secondary | ICD-10-CM | POA: Diagnosis not present

## 2018-01-04 DIAGNOSIS — N186 End stage renal disease: Secondary | ICD-10-CM | POA: Diagnosis not present

## 2018-01-04 DIAGNOSIS — N2581 Secondary hyperparathyroidism of renal origin: Secondary | ICD-10-CM | POA: Diagnosis not present

## 2018-01-05 DIAGNOSIS — Z4932 Encounter for adequacy testing for peritoneal dialysis: Secondary | ICD-10-CM | POA: Diagnosis not present

## 2018-01-05 DIAGNOSIS — D631 Anemia in chronic kidney disease: Secondary | ICD-10-CM | POA: Diagnosis not present

## 2018-01-05 DIAGNOSIS — N2581 Secondary hyperparathyroidism of renal origin: Secondary | ICD-10-CM | POA: Diagnosis not present

## 2018-01-05 DIAGNOSIS — N186 End stage renal disease: Secondary | ICD-10-CM | POA: Diagnosis not present

## 2018-01-05 DIAGNOSIS — E118 Type 2 diabetes mellitus with unspecified complications: Secondary | ICD-10-CM | POA: Diagnosis not present

## 2018-01-05 DIAGNOSIS — Z23 Encounter for immunization: Secondary | ICD-10-CM | POA: Diagnosis not present

## 2018-01-06 DIAGNOSIS — D631 Anemia in chronic kidney disease: Secondary | ICD-10-CM | POA: Diagnosis not present

## 2018-01-06 DIAGNOSIS — N186 End stage renal disease: Secondary | ICD-10-CM | POA: Diagnosis not present

## 2018-01-06 DIAGNOSIS — N2581 Secondary hyperparathyroidism of renal origin: Secondary | ICD-10-CM | POA: Diagnosis not present

## 2018-01-06 DIAGNOSIS — Z23 Encounter for immunization: Secondary | ICD-10-CM | POA: Diagnosis not present

## 2018-01-07 DIAGNOSIS — D631 Anemia in chronic kidney disease: Secondary | ICD-10-CM | POA: Diagnosis not present

## 2018-01-07 DIAGNOSIS — N186 End stage renal disease: Secondary | ICD-10-CM | POA: Diagnosis not present

## 2018-01-07 DIAGNOSIS — N2581 Secondary hyperparathyroidism of renal origin: Secondary | ICD-10-CM | POA: Diagnosis not present

## 2018-01-07 DIAGNOSIS — Z23 Encounter for immunization: Secondary | ICD-10-CM | POA: Diagnosis not present

## 2018-01-08 ENCOUNTER — Encounter: Payer: Self-pay | Admitting: Sports Medicine

## 2018-01-08 ENCOUNTER — Ambulatory Visit (INDEPENDENT_AMBULATORY_CARE_PROVIDER_SITE_OTHER): Payer: Medicare Other | Admitting: Sports Medicine

## 2018-01-08 DIAGNOSIS — M1120 Other chondrocalcinosis, unspecified site: Secondary | ICD-10-CM

## 2018-01-08 DIAGNOSIS — Z23 Encounter for immunization: Secondary | ICD-10-CM | POA: Diagnosis not present

## 2018-01-08 DIAGNOSIS — D631 Anemia in chronic kidney disease: Secondary | ICD-10-CM | POA: Diagnosis not present

## 2018-01-08 DIAGNOSIS — N2581 Secondary hyperparathyroidism of renal origin: Secondary | ICD-10-CM | POA: Diagnosis not present

## 2018-01-08 DIAGNOSIS — N186 End stage renal disease: Secondary | ICD-10-CM | POA: Diagnosis not present

## 2018-01-08 MED ORDER — COLCHICINE 0.6 MG PO TABS: ORAL_TABLET | ORAL | 1 refills | Status: DC

## 2018-01-08 NOTE — Assessment & Plan Note (Signed)
Osteoarthritis and pseudogout. He can use colchicine 0.3 mg twice a week for flares.

## 2018-01-08 NOTE — Progress Notes (Signed)
Subjective:    CC: Follow-up  HPI: This is a pleasant 77 year old male with a history of knee osteoarthritis and pseudogout, he was admitted to the hospital with a prostate abscess, afterwards he developed swelling and pain in his left knee, this has since resolved.  No fevers, chills, night sweats, weight loss, trauma, mechanical symptoms.  I reviewed the past medical history, family history, social history, surgical history, and allergies today and no changes were needed.  Please see the problem list section below in epic for further details.  Past Medical History: Past Medical History:  Diagnosis Date  . Chronic kidney disease   . Diabetes ([place])   . Hypertension    Past Surgical History: No past surgical history on file. Social History: Social History   Socioeconomic History  . Marital status: Married    Spouse name: Not on file  . Number of children: Not on file  . Years of education: Not on file  . Highest education level: Not on file  Occupational History  . Not on file  Social Needs  . Financial resource strain: Not on file  . Food insecurity:    Worry: Not on file    Inability: Not on file  . Transportation needs:    Medical: Not on file    Non-medical: Not on file  Tobacco Use  . Smoking status: Former Smoker    Packs/day: 1.00    Years: 20.00    Pack years: 20.00    Types: Cigars    Last attempt to quit: 09/06/1981    Years since quitting: 36.3  . Smokeless tobacco: Never Used  Substance and Sexual Activity  . Alcohol use: No  . Drug use: No  . Sexual activity: Yes    Partners: Female  Lifestyle  . Physical activity:    Days per week: Not on file    Minutes per session: Not on file  . Stress: Not on file  Relationships  . Social connections:    Talks on phone: Not on file    Gets together: Not on file    Attends religious service: Not on file    Active member of club or organization: Not on file    Attends meetings of clubs or organizations:  Not on file    Relationship status: Not on file  Other Topics Concern  . Not on file  Social History Narrative  . Not on file   Family History: Family History  Problem Relation Age of Onset  . Diabetes Mother   . Hypertension Mother   . Hypertension Father   . Heart attack Father 32       This is what he died from.  . Diabetes Sister   . Diabetes Brother   . Diabetes Brother   . Diabetes Sister    Allergies: Allergies  Allergen Reactions  . Hydralazine Hypertension    Odd worsening of HTN.   Medications: See med rec.  Review of Systems: No fevers, chills, night sweats, weight loss, chest pain, or shortness of breath.   Objective:    General: Well Developed, well nourished, and in no acute distress.  Neuro: Alert and oriented x3, extra-ocular muscles intact, sensation grossly intact.  HEENT: Normocephalic, atraumatic, pupils equal round reactive to light, neck supple, no masses, no lymphadenopathy, thyroid nonpalpable.  Skin: Warm and dry, no rashes. Cardiac: Regular rate and rhythm, no murmurs rubs or gallops, no lower extremity edema.  Respiratory: Clear to auscultation bilaterally. Not using accessory muscles, speaking  in full sentences. Left knee: Normal to inspection with no erythema or effusion or obvious bony abnormalities. Palpation normal with no warmth or joint line tenderness or patellar tenderness or condyle tenderness. ROM normal in flexion and extension and lower leg rotation. Ligaments with solid consistent endpoints including ACL, PCL, LCL, MCL. Negative Mcmurray's and provocative meniscal tests. Non painful patellar compression. Patellar and quadriceps tendons unremarkable. Hamstring and quadriceps strength is normal.  Impression and Recommendations:    Pseudogout Osteoarthritis and pseudogout. He can use colchicine 0.3 mg twice a week for flares.  ___________________________________________ Gwen Her. Dianah Field, M.D., ABFM., CAQSM. Primary  Care and Sports Medicine [place] [facility]  Adjunct Professor of [place] of [facility] of Medicine

## 2018-01-09 DIAGNOSIS — N186 End stage renal disease: Secondary | ICD-10-CM | POA: Diagnosis not present

## 2018-01-09 DIAGNOSIS — N2581 Secondary hyperparathyroidism of renal origin: Secondary | ICD-10-CM | POA: Diagnosis not present

## 2018-01-09 DIAGNOSIS — Z23 Encounter for immunization: Secondary | ICD-10-CM | POA: Diagnosis not present

## 2018-01-09 DIAGNOSIS — D631 Anemia in chronic kidney disease: Secondary | ICD-10-CM | POA: Diagnosis not present

## 2018-01-10 DIAGNOSIS — D631 Anemia in chronic kidney disease: Secondary | ICD-10-CM | POA: Diagnosis not present

## 2018-01-10 DIAGNOSIS — N2581 Secondary hyperparathyroidism of renal origin: Secondary | ICD-10-CM | POA: Diagnosis not present

## 2018-01-10 DIAGNOSIS — Z23 Encounter for immunization: Secondary | ICD-10-CM | POA: Diagnosis not present

## 2018-01-10 DIAGNOSIS — N186 End stage renal disease: Secondary | ICD-10-CM | POA: Diagnosis not present

## 2018-01-11 DIAGNOSIS — N186 End stage renal disease: Secondary | ICD-10-CM | POA: Diagnosis not present

## 2018-01-11 DIAGNOSIS — N2581 Secondary hyperparathyroidism of renal origin: Secondary | ICD-10-CM | POA: Diagnosis not present

## 2018-01-11 DIAGNOSIS — D631 Anemia in chronic kidney disease: Secondary | ICD-10-CM | POA: Diagnosis not present

## 2018-01-11 DIAGNOSIS — Z23 Encounter for immunization: Secondary | ICD-10-CM | POA: Diagnosis not present

## 2018-01-12 DIAGNOSIS — N2581 Secondary hyperparathyroidism of renal origin: Secondary | ICD-10-CM | POA: Diagnosis not present

## 2018-01-12 DIAGNOSIS — D631 Anemia in chronic kidney disease: Secondary | ICD-10-CM | POA: Diagnosis not present

## 2018-01-12 DIAGNOSIS — N186 End stage renal disease: Secondary | ICD-10-CM | POA: Diagnosis not present

## 2018-01-12 DIAGNOSIS — Z23 Encounter for immunization: Secondary | ICD-10-CM | POA: Diagnosis not present

## 2018-01-13 DIAGNOSIS — I10 Essential (primary) hypertension: Secondary | ICD-10-CM | POA: Diagnosis not present

## 2018-01-13 DIAGNOSIS — N186 End stage renal disease: Secondary | ICD-10-CM | POA: Diagnosis not present

## 2018-01-13 DIAGNOSIS — Z23 Encounter for immunization: Secondary | ICD-10-CM | POA: Diagnosis not present

## 2018-01-13 DIAGNOSIS — N2581 Secondary hyperparathyroidism of renal origin: Secondary | ICD-10-CM | POA: Diagnosis not present

## 2018-01-13 DIAGNOSIS — N529 Male erectile dysfunction, unspecified: Secondary | ICD-10-CM | POA: Diagnosis not present

## 2018-01-13 DIAGNOSIS — N390 Urinary tract infection, site not specified: Secondary | ICD-10-CM | POA: Diagnosis not present

## 2018-01-13 DIAGNOSIS — D631 Anemia in chronic kidney disease: Secondary | ICD-10-CM | POA: Diagnosis not present

## 2018-01-14 DIAGNOSIS — Z23 Encounter for immunization: Secondary | ICD-10-CM | POA: Diagnosis not present

## 2018-01-14 DIAGNOSIS — N186 End stage renal disease: Secondary | ICD-10-CM | POA: Diagnosis not present

## 2018-01-14 DIAGNOSIS — D631 Anemia in chronic kidney disease: Secondary | ICD-10-CM | POA: Diagnosis not present

## 2018-01-14 DIAGNOSIS — N2581 Secondary hyperparathyroidism of renal origin: Secondary | ICD-10-CM | POA: Diagnosis not present

## 2018-01-15 DIAGNOSIS — N186 End stage renal disease: Secondary | ICD-10-CM | POA: Diagnosis not present

## 2018-01-15 DIAGNOSIS — N2581 Secondary hyperparathyroidism of renal origin: Secondary | ICD-10-CM | POA: Diagnosis not present

## 2018-01-15 DIAGNOSIS — D631 Anemia in chronic kidney disease: Secondary | ICD-10-CM | POA: Diagnosis not present

## 2018-01-15 DIAGNOSIS — Z23 Encounter for immunization: Secondary | ICD-10-CM | POA: Diagnosis not present

## 2018-01-16 DIAGNOSIS — D631 Anemia in chronic kidney disease: Secondary | ICD-10-CM | POA: Diagnosis not present

## 2018-01-16 DIAGNOSIS — N186 End stage renal disease: Secondary | ICD-10-CM | POA: Diagnosis not present

## 2018-01-16 DIAGNOSIS — N2581 Secondary hyperparathyroidism of renal origin: Secondary | ICD-10-CM | POA: Diagnosis not present

## 2018-01-16 DIAGNOSIS — Z23 Encounter for immunization: Secondary | ICD-10-CM | POA: Diagnosis not present

## 2018-01-17 DIAGNOSIS — N2581 Secondary hyperparathyroidism of renal origin: Secondary | ICD-10-CM | POA: Diagnosis not present

## 2018-01-17 DIAGNOSIS — N186 End stage renal disease: Secondary | ICD-10-CM | POA: Diagnosis not present

## 2018-01-17 DIAGNOSIS — D631 Anemia in chronic kidney disease: Secondary | ICD-10-CM | POA: Diagnosis not present

## 2018-01-17 DIAGNOSIS — Z23 Encounter for immunization: Secondary | ICD-10-CM | POA: Diagnosis not present

## 2018-01-18 DIAGNOSIS — D631 Anemia in chronic kidney disease: Secondary | ICD-10-CM | POA: Diagnosis not present

## 2018-01-18 DIAGNOSIS — Z23 Encounter for immunization: Secondary | ICD-10-CM | POA: Diagnosis not present

## 2018-01-18 DIAGNOSIS — N186 End stage renal disease: Secondary | ICD-10-CM | POA: Diagnosis not present

## 2018-01-18 DIAGNOSIS — N2581 Secondary hyperparathyroidism of renal origin: Secondary | ICD-10-CM | POA: Diagnosis not present

## 2018-01-19 DIAGNOSIS — I10 Essential (primary) hypertension: Secondary | ICD-10-CM | POA: Diagnosis not present

## 2018-01-19 DIAGNOSIS — R109 Unspecified abdominal pain: Secondary | ICD-10-CM | POA: Diagnosis not present

## 2018-01-19 DIAGNOSIS — N186 End stage renal disease: Secondary | ICD-10-CM | POA: Diagnosis not present

## 2018-01-19 DIAGNOSIS — Z4902 Encounter for fitting and adjustment of peritoneal dialysis catheter: Secondary | ICD-10-CM | POA: Diagnosis not present

## 2018-01-19 DIAGNOSIS — Z23 Encounter for immunization: Secondary | ICD-10-CM | POA: Diagnosis not present

## 2018-01-19 DIAGNOSIS — D631 Anemia in chronic kidney disease: Secondary | ICD-10-CM | POA: Diagnosis not present

## 2018-01-19 DIAGNOSIS — N2581 Secondary hyperparathyroidism of renal origin: Secondary | ICD-10-CM | POA: Diagnosis not present

## 2018-01-20 DIAGNOSIS — D631 Anemia in chronic kidney disease: Secondary | ICD-10-CM | POA: Diagnosis not present

## 2018-01-20 DIAGNOSIS — Z23 Encounter for immunization: Secondary | ICD-10-CM | POA: Diagnosis not present

## 2018-01-20 DIAGNOSIS — N2581 Secondary hyperparathyroidism of renal origin: Secondary | ICD-10-CM | POA: Diagnosis not present

## 2018-01-20 DIAGNOSIS — N186 End stage renal disease: Secondary | ICD-10-CM | POA: Diagnosis not present

## 2018-01-21 DIAGNOSIS — N2581 Secondary hyperparathyroidism of renal origin: Secondary | ICD-10-CM | POA: Diagnosis not present

## 2018-01-21 DIAGNOSIS — N186 End stage renal disease: Secondary | ICD-10-CM | POA: Diagnosis not present

## 2018-01-21 DIAGNOSIS — D631 Anemia in chronic kidney disease: Secondary | ICD-10-CM | POA: Diagnosis not present

## 2018-01-21 DIAGNOSIS — Z23 Encounter for immunization: Secondary | ICD-10-CM | POA: Diagnosis not present

## 2018-01-22 DIAGNOSIS — D631 Anemia in chronic kidney disease: Secondary | ICD-10-CM | POA: Diagnosis not present

## 2018-01-22 DIAGNOSIS — N186 End stage renal disease: Secondary | ICD-10-CM | POA: Diagnosis not present

## 2018-01-22 DIAGNOSIS — N2581 Secondary hyperparathyroidism of renal origin: Secondary | ICD-10-CM | POA: Diagnosis not present

## 2018-01-22 DIAGNOSIS — Z23 Encounter for immunization: Secondary | ICD-10-CM | POA: Diagnosis not present

## 2018-01-23 DIAGNOSIS — Z23 Encounter for immunization: Secondary | ICD-10-CM | POA: Diagnosis not present

## 2018-01-23 DIAGNOSIS — D631 Anemia in chronic kidney disease: Secondary | ICD-10-CM | POA: Diagnosis not present

## 2018-01-23 DIAGNOSIS — N2581 Secondary hyperparathyroidism of renal origin: Secondary | ICD-10-CM | POA: Diagnosis not present

## 2018-01-23 DIAGNOSIS — N186 End stage renal disease: Secondary | ICD-10-CM | POA: Diagnosis not present

## 2018-01-23 DIAGNOSIS — Z992 Dependence on renal dialysis: Secondary | ICD-10-CM | POA: Diagnosis not present

## 2018-01-24 DIAGNOSIS — N186 End stage renal disease: Secondary | ICD-10-CM | POA: Diagnosis not present

## 2018-01-24 DIAGNOSIS — Z23 Encounter for immunization: Secondary | ICD-10-CM | POA: Diagnosis not present

## 2018-01-24 DIAGNOSIS — N2581 Secondary hyperparathyroidism of renal origin: Secondary | ICD-10-CM | POA: Diagnosis not present

## 2018-01-24 DIAGNOSIS — D631 Anemia in chronic kidney disease: Secondary | ICD-10-CM | POA: Diagnosis not present

## 2018-01-25 DIAGNOSIS — D631 Anemia in chronic kidney disease: Secondary | ICD-10-CM | POA: Diagnosis not present

## 2018-01-25 DIAGNOSIS — Z23 Encounter for immunization: Secondary | ICD-10-CM | POA: Diagnosis not present

## 2018-01-25 DIAGNOSIS — N186 End stage renal disease: Secondary | ICD-10-CM | POA: Diagnosis not present

## 2018-01-25 DIAGNOSIS — N2581 Secondary hyperparathyroidism of renal origin: Secondary | ICD-10-CM | POA: Diagnosis not present

## 2018-01-26 DIAGNOSIS — Z23 Encounter for immunization: Secondary | ICD-10-CM | POA: Diagnosis not present

## 2018-01-26 DIAGNOSIS — N186 End stage renal disease: Secondary | ICD-10-CM | POA: Diagnosis not present

## 2018-01-26 DIAGNOSIS — D631 Anemia in chronic kidney disease: Secondary | ICD-10-CM | POA: Diagnosis not present

## 2018-01-26 DIAGNOSIS — N2581 Secondary hyperparathyroidism of renal origin: Secondary | ICD-10-CM | POA: Diagnosis not present

## 2018-01-27 DIAGNOSIS — D631 Anemia in chronic kidney disease: Secondary | ICD-10-CM | POA: Diagnosis not present

## 2018-01-27 DIAGNOSIS — Z23 Encounter for immunization: Secondary | ICD-10-CM | POA: Diagnosis not present

## 2018-01-27 DIAGNOSIS — N186 End stage renal disease: Secondary | ICD-10-CM | POA: Diagnosis not present

## 2018-01-27 DIAGNOSIS — N2581 Secondary hyperparathyroidism of renal origin: Secondary | ICD-10-CM | POA: Diagnosis not present

## 2018-01-28 DIAGNOSIS — D631 Anemia in chronic kidney disease: Secondary | ICD-10-CM | POA: Diagnosis not present

## 2018-01-28 DIAGNOSIS — N2581 Secondary hyperparathyroidism of renal origin: Secondary | ICD-10-CM | POA: Diagnosis not present

## 2018-01-28 DIAGNOSIS — N186 End stage renal disease: Secondary | ICD-10-CM | POA: Diagnosis not present

## 2018-01-28 DIAGNOSIS — Z23 Encounter for immunization: Secondary | ICD-10-CM | POA: Diagnosis not present

## 2018-01-29 DIAGNOSIS — Z23 Encounter for immunization: Secondary | ICD-10-CM | POA: Diagnosis not present

## 2018-01-29 DIAGNOSIS — D631 Anemia in chronic kidney disease: Secondary | ICD-10-CM | POA: Diagnosis not present

## 2018-01-29 DIAGNOSIS — N186 End stage renal disease: Secondary | ICD-10-CM | POA: Diagnosis not present

## 2018-01-29 DIAGNOSIS — N2581 Secondary hyperparathyroidism of renal origin: Secondary | ICD-10-CM | POA: Diagnosis not present

## 2018-01-30 DIAGNOSIS — N2581 Secondary hyperparathyroidism of renal origin: Secondary | ICD-10-CM | POA: Diagnosis not present

## 2018-01-30 DIAGNOSIS — Z23 Encounter for immunization: Secondary | ICD-10-CM | POA: Diagnosis not present

## 2018-01-30 DIAGNOSIS — D631 Anemia in chronic kidney disease: Secondary | ICD-10-CM | POA: Diagnosis not present

## 2018-01-30 DIAGNOSIS — N186 End stage renal disease: Secondary | ICD-10-CM | POA: Diagnosis not present

## 2018-01-31 DIAGNOSIS — D631 Anemia in chronic kidney disease: Secondary | ICD-10-CM | POA: Diagnosis not present

## 2018-01-31 DIAGNOSIS — N186 End stage renal disease: Secondary | ICD-10-CM | POA: Diagnosis not present

## 2018-01-31 DIAGNOSIS — N2581 Secondary hyperparathyroidism of renal origin: Secondary | ICD-10-CM | POA: Diagnosis not present

## 2018-01-31 DIAGNOSIS — Z23 Encounter for immunization: Secondary | ICD-10-CM | POA: Diagnosis not present

## 2018-02-01 DIAGNOSIS — N186 End stage renal disease: Secondary | ICD-10-CM | POA: Diagnosis not present

## 2018-02-01 DIAGNOSIS — Z23 Encounter for immunization: Secondary | ICD-10-CM | POA: Diagnosis not present

## 2018-02-01 DIAGNOSIS — N2581 Secondary hyperparathyroidism of renal origin: Secondary | ICD-10-CM | POA: Diagnosis not present

## 2018-02-01 DIAGNOSIS — D631 Anemia in chronic kidney disease: Secondary | ICD-10-CM | POA: Diagnosis not present

## 2018-02-02 DIAGNOSIS — N186 End stage renal disease: Secondary | ICD-10-CM | POA: Diagnosis not present

## 2018-02-02 DIAGNOSIS — D631 Anemia in chronic kidney disease: Secondary | ICD-10-CM | POA: Diagnosis not present

## 2018-02-02 DIAGNOSIS — Z23 Encounter for immunization: Secondary | ICD-10-CM | POA: Diagnosis not present

## 2018-02-02 DIAGNOSIS — N2581 Secondary hyperparathyroidism of renal origin: Secondary | ICD-10-CM | POA: Diagnosis not present

## 2018-02-02 DIAGNOSIS — E118 Type 2 diabetes mellitus with unspecified complications: Secondary | ICD-10-CM | POA: Diagnosis not present

## 2018-02-03 DIAGNOSIS — Z23 Encounter for immunization: Secondary | ICD-10-CM | POA: Diagnosis not present

## 2018-02-03 DIAGNOSIS — D631 Anemia in chronic kidney disease: Secondary | ICD-10-CM | POA: Diagnosis not present

## 2018-02-03 DIAGNOSIS — N2581 Secondary hyperparathyroidism of renal origin: Secondary | ICD-10-CM | POA: Diagnosis not present

## 2018-02-03 DIAGNOSIS — N186 End stage renal disease: Secondary | ICD-10-CM | POA: Diagnosis not present

## 2018-02-05 DIAGNOSIS — Z23 Encounter for immunization: Secondary | ICD-10-CM | POA: Diagnosis not present

## 2018-02-05 DIAGNOSIS — N186 End stage renal disease: Secondary | ICD-10-CM | POA: Diagnosis not present

## 2018-02-05 DIAGNOSIS — D631 Anemia in chronic kidney disease: Secondary | ICD-10-CM | POA: Diagnosis not present

## 2018-02-05 DIAGNOSIS — N2581 Secondary hyperparathyroidism of renal origin: Secondary | ICD-10-CM | POA: Diagnosis not present

## 2018-02-06 DIAGNOSIS — Z23 Encounter for immunization: Secondary | ICD-10-CM | POA: Diagnosis not present

## 2018-02-06 DIAGNOSIS — N186 End stage renal disease: Secondary | ICD-10-CM | POA: Diagnosis not present

## 2018-02-06 DIAGNOSIS — D631 Anemia in chronic kidney disease: Secondary | ICD-10-CM | POA: Diagnosis not present

## 2018-02-06 DIAGNOSIS — N2581 Secondary hyperparathyroidism of renal origin: Secondary | ICD-10-CM | POA: Diagnosis not present

## 2018-02-07 DIAGNOSIS — D631 Anemia in chronic kidney disease: Secondary | ICD-10-CM | POA: Diagnosis not present

## 2018-02-07 DIAGNOSIS — N186 End stage renal disease: Secondary | ICD-10-CM | POA: Diagnosis not present

## 2018-02-07 DIAGNOSIS — Z23 Encounter for immunization: Secondary | ICD-10-CM | POA: Diagnosis not present

## 2018-02-07 DIAGNOSIS — N2581 Secondary hyperparathyroidism of renal origin: Secondary | ICD-10-CM | POA: Diagnosis not present

## 2018-02-08 DIAGNOSIS — N2581 Secondary hyperparathyroidism of renal origin: Secondary | ICD-10-CM | POA: Diagnosis not present

## 2018-02-08 DIAGNOSIS — D631 Anemia in chronic kidney disease: Secondary | ICD-10-CM | POA: Diagnosis not present

## 2018-02-08 DIAGNOSIS — Z23 Encounter for immunization: Secondary | ICD-10-CM | POA: Diagnosis not present

## 2018-02-08 DIAGNOSIS — N186 End stage renal disease: Secondary | ICD-10-CM | POA: Diagnosis not present

## 2018-02-09 DIAGNOSIS — D631 Anemia in chronic kidney disease: Secondary | ICD-10-CM | POA: Diagnosis not present

## 2018-02-09 DIAGNOSIS — N186 End stage renal disease: Secondary | ICD-10-CM | POA: Diagnosis not present

## 2018-02-09 DIAGNOSIS — Z23 Encounter for immunization: Secondary | ICD-10-CM | POA: Diagnosis not present

## 2018-02-09 DIAGNOSIS — N2581 Secondary hyperparathyroidism of renal origin: Secondary | ICD-10-CM | POA: Diagnosis not present

## 2018-02-10 DIAGNOSIS — N2581 Secondary hyperparathyroidism of renal origin: Secondary | ICD-10-CM | POA: Diagnosis not present

## 2018-02-10 DIAGNOSIS — D631 Anemia in chronic kidney disease: Secondary | ICD-10-CM | POA: Diagnosis not present

## 2018-02-10 DIAGNOSIS — Z23 Encounter for immunization: Secondary | ICD-10-CM | POA: Diagnosis not present

## 2018-02-10 DIAGNOSIS — N186 End stage renal disease: Secondary | ICD-10-CM | POA: Diagnosis not present

## 2018-02-11 DIAGNOSIS — N2581 Secondary hyperparathyroidism of renal origin: Secondary | ICD-10-CM | POA: Diagnosis not present

## 2018-02-11 DIAGNOSIS — N186 End stage renal disease: Secondary | ICD-10-CM | POA: Diagnosis not present

## 2018-02-11 DIAGNOSIS — D631 Anemia in chronic kidney disease: Secondary | ICD-10-CM | POA: Diagnosis not present

## 2018-02-11 DIAGNOSIS — Z23 Encounter for immunization: Secondary | ICD-10-CM | POA: Diagnosis not present

## 2018-02-12 DIAGNOSIS — Z23 Encounter for immunization: Secondary | ICD-10-CM | POA: Diagnosis not present

## 2018-02-12 DIAGNOSIS — D631 Anemia in chronic kidney disease: Secondary | ICD-10-CM | POA: Diagnosis not present

## 2018-02-12 DIAGNOSIS — N2581 Secondary hyperparathyroidism of renal origin: Secondary | ICD-10-CM | POA: Diagnosis not present

## 2018-02-12 DIAGNOSIS — N186 End stage renal disease: Secondary | ICD-10-CM | POA: Diagnosis not present

## 2018-02-13 DIAGNOSIS — N186 End stage renal disease: Secondary | ICD-10-CM | POA: Diagnosis not present

## 2018-02-13 DIAGNOSIS — D631 Anemia in chronic kidney disease: Secondary | ICD-10-CM | POA: Diagnosis not present

## 2018-02-13 DIAGNOSIS — Z23 Encounter for immunization: Secondary | ICD-10-CM | POA: Diagnosis not present

## 2018-02-13 DIAGNOSIS — N2581 Secondary hyperparathyroidism of renal origin: Secondary | ICD-10-CM | POA: Diagnosis not present

## 2018-02-14 DIAGNOSIS — D631 Anemia in chronic kidney disease: Secondary | ICD-10-CM | POA: Diagnosis not present

## 2018-02-14 DIAGNOSIS — N186 End stage renal disease: Secondary | ICD-10-CM | POA: Diagnosis not present

## 2018-02-14 DIAGNOSIS — N2581 Secondary hyperparathyroidism of renal origin: Secondary | ICD-10-CM | POA: Diagnosis not present

## 2018-02-14 DIAGNOSIS — Z23 Encounter for immunization: Secondary | ICD-10-CM | POA: Diagnosis not present

## 2018-02-15 DIAGNOSIS — D631 Anemia in chronic kidney disease: Secondary | ICD-10-CM | POA: Diagnosis not present

## 2018-02-15 DIAGNOSIS — N2581 Secondary hyperparathyroidism of renal origin: Secondary | ICD-10-CM | POA: Diagnosis not present

## 2018-02-15 DIAGNOSIS — Z23 Encounter for immunization: Secondary | ICD-10-CM | POA: Diagnosis not present

## 2018-02-15 DIAGNOSIS — N186 End stage renal disease: Secondary | ICD-10-CM | POA: Diagnosis not present

## 2018-02-16 DIAGNOSIS — D631 Anemia in chronic kidney disease: Secondary | ICD-10-CM | POA: Diagnosis not present

## 2018-02-16 DIAGNOSIS — N2581 Secondary hyperparathyroidism of renal origin: Secondary | ICD-10-CM | POA: Diagnosis not present

## 2018-02-16 DIAGNOSIS — N186 End stage renal disease: Secondary | ICD-10-CM | POA: Diagnosis not present

## 2018-02-16 DIAGNOSIS — Z23 Encounter for immunization: Secondary | ICD-10-CM | POA: Diagnosis not present

## 2018-02-17 DIAGNOSIS — N2581 Secondary hyperparathyroidism of renal origin: Secondary | ICD-10-CM | POA: Diagnosis not present

## 2018-02-17 DIAGNOSIS — N186 End stage renal disease: Secondary | ICD-10-CM | POA: Diagnosis not present

## 2018-02-17 DIAGNOSIS — Z23 Encounter for immunization: Secondary | ICD-10-CM | POA: Diagnosis not present

## 2018-02-17 DIAGNOSIS — D631 Anemia in chronic kidney disease: Secondary | ICD-10-CM | POA: Diagnosis not present

## 2018-02-18 DIAGNOSIS — N186 End stage renal disease: Secondary | ICD-10-CM | POA: Diagnosis not present

## 2018-02-18 DIAGNOSIS — D631 Anemia in chronic kidney disease: Secondary | ICD-10-CM | POA: Diagnosis not present

## 2018-02-18 DIAGNOSIS — N2581 Secondary hyperparathyroidism of renal origin: Secondary | ICD-10-CM | POA: Diagnosis not present

## 2018-02-18 DIAGNOSIS — Z23 Encounter for immunization: Secondary | ICD-10-CM | POA: Diagnosis not present

## 2018-02-19 DIAGNOSIS — Z23 Encounter for immunization: Secondary | ICD-10-CM | POA: Diagnosis not present

## 2018-02-19 DIAGNOSIS — N2581 Secondary hyperparathyroidism of renal origin: Secondary | ICD-10-CM | POA: Diagnosis not present

## 2018-02-19 DIAGNOSIS — N186 End stage renal disease: Secondary | ICD-10-CM | POA: Diagnosis not present

## 2018-02-19 DIAGNOSIS — D631 Anemia in chronic kidney disease: Secondary | ICD-10-CM | POA: Diagnosis not present

## 2018-02-20 DIAGNOSIS — N186 End stage renal disease: Secondary | ICD-10-CM | POA: Diagnosis not present

## 2018-02-20 DIAGNOSIS — D631 Anemia in chronic kidney disease: Secondary | ICD-10-CM | POA: Diagnosis not present

## 2018-02-20 DIAGNOSIS — Z23 Encounter for immunization: Secondary | ICD-10-CM | POA: Diagnosis not present

## 2018-02-20 DIAGNOSIS — N2581 Secondary hyperparathyroidism of renal origin: Secondary | ICD-10-CM | POA: Diagnosis not present

## 2018-02-21 DIAGNOSIS — N2581 Secondary hyperparathyroidism of renal origin: Secondary | ICD-10-CM | POA: Diagnosis not present

## 2018-02-21 DIAGNOSIS — N186 End stage renal disease: Secondary | ICD-10-CM | POA: Diagnosis not present

## 2018-02-21 DIAGNOSIS — D631 Anemia in chronic kidney disease: Secondary | ICD-10-CM | POA: Diagnosis not present

## 2018-02-21 DIAGNOSIS — Z23 Encounter for immunization: Secondary | ICD-10-CM | POA: Diagnosis not present

## 2018-02-22 DIAGNOSIS — Z23 Encounter for immunization: Secondary | ICD-10-CM | POA: Diagnosis not present

## 2018-02-22 DIAGNOSIS — N186 End stage renal disease: Secondary | ICD-10-CM | POA: Diagnosis not present

## 2018-02-22 DIAGNOSIS — D631 Anemia in chronic kidney disease: Secondary | ICD-10-CM | POA: Diagnosis not present

## 2018-02-22 DIAGNOSIS — N2581 Secondary hyperparathyroidism of renal origin: Secondary | ICD-10-CM | POA: Diagnosis not present

## 2018-02-23 DIAGNOSIS — D631 Anemia in chronic kidney disease: Secondary | ICD-10-CM | POA: Diagnosis not present

## 2018-02-23 DIAGNOSIS — N186 End stage renal disease: Secondary | ICD-10-CM | POA: Diagnosis not present

## 2018-02-23 DIAGNOSIS — Z992 Dependence on renal dialysis: Secondary | ICD-10-CM | POA: Diagnosis not present

## 2018-02-23 DIAGNOSIS — N2581 Secondary hyperparathyroidism of renal origin: Secondary | ICD-10-CM | POA: Diagnosis not present

## 2018-02-23 DIAGNOSIS — Z23 Encounter for immunization: Secondary | ICD-10-CM | POA: Diagnosis not present

## 2018-02-24 DIAGNOSIS — Z23 Encounter for immunization: Secondary | ICD-10-CM | POA: Diagnosis not present

## 2018-02-24 DIAGNOSIS — N186 End stage renal disease: Secondary | ICD-10-CM | POA: Diagnosis not present

## 2018-02-24 DIAGNOSIS — D631 Anemia in chronic kidney disease: Secondary | ICD-10-CM | POA: Diagnosis not present

## 2018-02-24 DIAGNOSIS — N2581 Secondary hyperparathyroidism of renal origin: Secondary | ICD-10-CM | POA: Diagnosis not present

## 2018-02-24 DIAGNOSIS — D509 Iron deficiency anemia, unspecified: Secondary | ICD-10-CM | POA: Diagnosis not present

## 2018-02-25 DIAGNOSIS — N2581 Secondary hyperparathyroidism of renal origin: Secondary | ICD-10-CM | POA: Diagnosis not present

## 2018-02-25 DIAGNOSIS — D631 Anemia in chronic kidney disease: Secondary | ICD-10-CM | POA: Diagnosis not present

## 2018-02-25 DIAGNOSIS — N186 End stage renal disease: Secondary | ICD-10-CM | POA: Diagnosis not present

## 2018-02-25 DIAGNOSIS — Z23 Encounter for immunization: Secondary | ICD-10-CM | POA: Diagnosis not present

## 2018-02-25 DIAGNOSIS — D509 Iron deficiency anemia, unspecified: Secondary | ICD-10-CM | POA: Diagnosis not present

## 2018-02-26 DIAGNOSIS — Z23 Encounter for immunization: Secondary | ICD-10-CM | POA: Diagnosis not present

## 2018-02-26 DIAGNOSIS — N2581 Secondary hyperparathyroidism of renal origin: Secondary | ICD-10-CM | POA: Diagnosis not present

## 2018-02-26 DIAGNOSIS — N186 End stage renal disease: Secondary | ICD-10-CM | POA: Diagnosis not present

## 2018-02-26 DIAGNOSIS — D509 Iron deficiency anemia, unspecified: Secondary | ICD-10-CM | POA: Diagnosis not present

## 2018-02-26 DIAGNOSIS — D631 Anemia in chronic kidney disease: Secondary | ICD-10-CM | POA: Diagnosis not present

## 2018-02-27 DIAGNOSIS — N2581 Secondary hyperparathyroidism of renal origin: Secondary | ICD-10-CM | POA: Diagnosis not present

## 2018-02-27 DIAGNOSIS — D509 Iron deficiency anemia, unspecified: Secondary | ICD-10-CM | POA: Diagnosis not present

## 2018-02-27 DIAGNOSIS — N186 End stage renal disease: Secondary | ICD-10-CM | POA: Diagnosis not present

## 2018-02-27 DIAGNOSIS — Z23 Encounter for immunization: Secondary | ICD-10-CM | POA: Diagnosis not present

## 2018-02-27 DIAGNOSIS — D631 Anemia in chronic kidney disease: Secondary | ICD-10-CM | POA: Diagnosis not present

## 2018-02-28 DIAGNOSIS — N2581 Secondary hyperparathyroidism of renal origin: Secondary | ICD-10-CM | POA: Diagnosis not present

## 2018-02-28 DIAGNOSIS — Z23 Encounter for immunization: Secondary | ICD-10-CM | POA: Diagnosis not present

## 2018-02-28 DIAGNOSIS — N186 End stage renal disease: Secondary | ICD-10-CM | POA: Diagnosis not present

## 2018-02-28 DIAGNOSIS — D509 Iron deficiency anemia, unspecified: Secondary | ICD-10-CM | POA: Diagnosis not present

## 2018-02-28 DIAGNOSIS — D631 Anemia in chronic kidney disease: Secondary | ICD-10-CM | POA: Diagnosis not present

## 2018-03-01 DIAGNOSIS — D509 Iron deficiency anemia, unspecified: Secondary | ICD-10-CM | POA: Diagnosis not present

## 2018-03-01 DIAGNOSIS — N2581 Secondary hyperparathyroidism of renal origin: Secondary | ICD-10-CM | POA: Diagnosis not present

## 2018-03-01 DIAGNOSIS — N186 End stage renal disease: Secondary | ICD-10-CM | POA: Diagnosis not present

## 2018-03-01 DIAGNOSIS — D631 Anemia in chronic kidney disease: Secondary | ICD-10-CM | POA: Diagnosis not present

## 2018-03-01 DIAGNOSIS — Z23 Encounter for immunization: Secondary | ICD-10-CM | POA: Diagnosis not present

## 2018-03-02 DIAGNOSIS — D509 Iron deficiency anemia, unspecified: Secondary | ICD-10-CM | POA: Diagnosis not present

## 2018-03-02 DIAGNOSIS — D631 Anemia in chronic kidney disease: Secondary | ICD-10-CM | POA: Diagnosis not present

## 2018-03-02 DIAGNOSIS — N186 End stage renal disease: Secondary | ICD-10-CM | POA: Diagnosis not present

## 2018-03-02 DIAGNOSIS — N2581 Secondary hyperparathyroidism of renal origin: Secondary | ICD-10-CM | POA: Diagnosis not present

## 2018-03-02 DIAGNOSIS — Z23 Encounter for immunization: Secondary | ICD-10-CM | POA: Diagnosis not present

## 2018-03-03 DIAGNOSIS — N186 End stage renal disease: Secondary | ICD-10-CM | POA: Diagnosis not present

## 2018-03-03 DIAGNOSIS — D631 Anemia in chronic kidney disease: Secondary | ICD-10-CM | POA: Diagnosis not present

## 2018-03-03 DIAGNOSIS — Z23 Encounter for immunization: Secondary | ICD-10-CM | POA: Diagnosis not present

## 2018-03-03 DIAGNOSIS — D509 Iron deficiency anemia, unspecified: Secondary | ICD-10-CM | POA: Diagnosis not present

## 2018-03-03 DIAGNOSIS — N2581 Secondary hyperparathyroidism of renal origin: Secondary | ICD-10-CM | POA: Diagnosis not present

## 2018-03-04 DIAGNOSIS — N2581 Secondary hyperparathyroidism of renal origin: Secondary | ICD-10-CM | POA: Diagnosis not present

## 2018-03-04 DIAGNOSIS — D631 Anemia in chronic kidney disease: Secondary | ICD-10-CM | POA: Diagnosis not present

## 2018-03-04 DIAGNOSIS — N186 End stage renal disease: Secondary | ICD-10-CM | POA: Diagnosis not present

## 2018-03-04 DIAGNOSIS — Z23 Encounter for immunization: Secondary | ICD-10-CM | POA: Diagnosis not present

## 2018-03-04 DIAGNOSIS — D509 Iron deficiency anemia, unspecified: Secondary | ICD-10-CM | POA: Diagnosis not present

## 2018-03-05 DIAGNOSIS — D509 Iron deficiency anemia, unspecified: Secondary | ICD-10-CM | POA: Diagnosis not present

## 2018-03-05 DIAGNOSIS — D631 Anemia in chronic kidney disease: Secondary | ICD-10-CM | POA: Diagnosis not present

## 2018-03-05 DIAGNOSIS — N2581 Secondary hyperparathyroidism of renal origin: Secondary | ICD-10-CM | POA: Diagnosis not present

## 2018-03-05 DIAGNOSIS — N186 End stage renal disease: Secondary | ICD-10-CM | POA: Diagnosis not present

## 2018-03-05 DIAGNOSIS — Z23 Encounter for immunization: Secondary | ICD-10-CM | POA: Diagnosis not present

## 2018-03-06 DIAGNOSIS — N2581 Secondary hyperparathyroidism of renal origin: Secondary | ICD-10-CM | POA: Diagnosis not present

## 2018-03-06 DIAGNOSIS — Z23 Encounter for immunization: Secondary | ICD-10-CM | POA: Diagnosis not present

## 2018-03-06 DIAGNOSIS — N186 End stage renal disease: Secondary | ICD-10-CM | POA: Diagnosis not present

## 2018-03-06 DIAGNOSIS — D509 Iron deficiency anemia, unspecified: Secondary | ICD-10-CM | POA: Diagnosis not present

## 2018-03-06 DIAGNOSIS — D631 Anemia in chronic kidney disease: Secondary | ICD-10-CM | POA: Diagnosis not present

## 2018-03-07 DIAGNOSIS — D509 Iron deficiency anemia, unspecified: Secondary | ICD-10-CM | POA: Diagnosis not present

## 2018-03-07 DIAGNOSIS — N186 End stage renal disease: Secondary | ICD-10-CM | POA: Diagnosis not present

## 2018-03-07 DIAGNOSIS — N2581 Secondary hyperparathyroidism of renal origin: Secondary | ICD-10-CM | POA: Diagnosis not present

## 2018-03-07 DIAGNOSIS — Z23 Encounter for immunization: Secondary | ICD-10-CM | POA: Diagnosis not present

## 2018-03-07 DIAGNOSIS — D631 Anemia in chronic kidney disease: Secondary | ICD-10-CM | POA: Diagnosis not present

## 2018-03-08 DIAGNOSIS — E119 Type 2 diabetes mellitus without complications: Secondary | ICD-10-CM | POA: Diagnosis not present

## 2018-03-08 DIAGNOSIS — Z4932 Encounter for adequacy testing for peritoneal dialysis: Secondary | ICD-10-CM | POA: Diagnosis not present

## 2018-03-08 DIAGNOSIS — N186 End stage renal disease: Secondary | ICD-10-CM | POA: Diagnosis not present

## 2018-03-08 DIAGNOSIS — Z23 Encounter for immunization: Secondary | ICD-10-CM | POA: Diagnosis not present

## 2018-03-08 DIAGNOSIS — D509 Iron deficiency anemia, unspecified: Secondary | ICD-10-CM | POA: Diagnosis not present

## 2018-03-08 DIAGNOSIS — E118 Type 2 diabetes mellitus with unspecified complications: Secondary | ICD-10-CM | POA: Diagnosis not present

## 2018-03-08 DIAGNOSIS — D631 Anemia in chronic kidney disease: Secondary | ICD-10-CM | POA: Diagnosis not present

## 2018-03-08 DIAGNOSIS — N2581 Secondary hyperparathyroidism of renal origin: Secondary | ICD-10-CM | POA: Diagnosis not present

## 2018-03-09 DIAGNOSIS — D631 Anemia in chronic kidney disease: Secondary | ICD-10-CM | POA: Diagnosis not present

## 2018-03-09 DIAGNOSIS — N186 End stage renal disease: Secondary | ICD-10-CM | POA: Diagnosis not present

## 2018-03-09 DIAGNOSIS — N2581 Secondary hyperparathyroidism of renal origin: Secondary | ICD-10-CM | POA: Diagnosis not present

## 2018-03-09 DIAGNOSIS — Z23 Encounter for immunization: Secondary | ICD-10-CM | POA: Diagnosis not present

## 2018-03-09 DIAGNOSIS — D509 Iron deficiency anemia, unspecified: Secondary | ICD-10-CM | POA: Diagnosis not present

## 2018-03-10 DIAGNOSIS — D509 Iron deficiency anemia, unspecified: Secondary | ICD-10-CM | POA: Diagnosis not present

## 2018-03-10 DIAGNOSIS — D631 Anemia in chronic kidney disease: Secondary | ICD-10-CM | POA: Diagnosis not present

## 2018-03-10 DIAGNOSIS — N186 End stage renal disease: Secondary | ICD-10-CM | POA: Diagnosis not present

## 2018-03-10 DIAGNOSIS — N2581 Secondary hyperparathyroidism of renal origin: Secondary | ICD-10-CM | POA: Diagnosis not present

## 2018-03-10 DIAGNOSIS — Z23 Encounter for immunization: Secondary | ICD-10-CM | POA: Diagnosis not present

## 2018-03-11 DIAGNOSIS — D509 Iron deficiency anemia, unspecified: Secondary | ICD-10-CM | POA: Diagnosis not present

## 2018-03-11 DIAGNOSIS — Z23 Encounter for immunization: Secondary | ICD-10-CM | POA: Diagnosis not present

## 2018-03-11 DIAGNOSIS — N186 End stage renal disease: Secondary | ICD-10-CM | POA: Diagnosis not present

## 2018-03-11 DIAGNOSIS — D631 Anemia in chronic kidney disease: Secondary | ICD-10-CM | POA: Diagnosis not present

## 2018-03-11 DIAGNOSIS — N2581 Secondary hyperparathyroidism of renal origin: Secondary | ICD-10-CM | POA: Diagnosis not present

## 2018-03-12 DIAGNOSIS — D509 Iron deficiency anemia, unspecified: Secondary | ICD-10-CM | POA: Diagnosis not present

## 2018-03-12 DIAGNOSIS — N186 End stage renal disease: Secondary | ICD-10-CM | POA: Diagnosis not present

## 2018-03-12 DIAGNOSIS — D631 Anemia in chronic kidney disease: Secondary | ICD-10-CM | POA: Diagnosis not present

## 2018-03-12 DIAGNOSIS — Z23 Encounter for immunization: Secondary | ICD-10-CM | POA: Diagnosis not present

## 2018-03-12 DIAGNOSIS — N2581 Secondary hyperparathyroidism of renal origin: Secondary | ICD-10-CM | POA: Diagnosis not present

## 2018-03-13 DIAGNOSIS — D631 Anemia in chronic kidney disease: Secondary | ICD-10-CM | POA: Diagnosis not present

## 2018-03-13 DIAGNOSIS — Z23 Encounter for immunization: Secondary | ICD-10-CM | POA: Diagnosis not present

## 2018-03-13 DIAGNOSIS — N186 End stage renal disease: Secondary | ICD-10-CM | POA: Diagnosis not present

## 2018-03-13 DIAGNOSIS — N2581 Secondary hyperparathyroidism of renal origin: Secondary | ICD-10-CM | POA: Diagnosis not present

## 2018-03-13 DIAGNOSIS — D509 Iron deficiency anemia, unspecified: Secondary | ICD-10-CM | POA: Diagnosis not present

## 2018-03-14 DIAGNOSIS — N186 End stage renal disease: Secondary | ICD-10-CM | POA: Diagnosis not present

## 2018-03-14 DIAGNOSIS — N2581 Secondary hyperparathyroidism of renal origin: Secondary | ICD-10-CM | POA: Diagnosis not present

## 2018-03-14 DIAGNOSIS — D631 Anemia in chronic kidney disease: Secondary | ICD-10-CM | POA: Diagnosis not present

## 2018-03-14 DIAGNOSIS — D509 Iron deficiency anemia, unspecified: Secondary | ICD-10-CM | POA: Diagnosis not present

## 2018-03-14 DIAGNOSIS — Z23 Encounter for immunization: Secondary | ICD-10-CM | POA: Diagnosis not present

## 2018-03-15 DIAGNOSIS — D631 Anemia in chronic kidney disease: Secondary | ICD-10-CM | POA: Diagnosis not present

## 2018-03-15 DIAGNOSIS — D509 Iron deficiency anemia, unspecified: Secondary | ICD-10-CM | POA: Diagnosis not present

## 2018-03-15 DIAGNOSIS — N186 End stage renal disease: Secondary | ICD-10-CM | POA: Diagnosis not present

## 2018-03-15 DIAGNOSIS — Z23 Encounter for immunization: Secondary | ICD-10-CM | POA: Diagnosis not present

## 2018-03-15 DIAGNOSIS — N2581 Secondary hyperparathyroidism of renal origin: Secondary | ICD-10-CM | POA: Diagnosis not present

## 2018-03-16 DIAGNOSIS — N186 End stage renal disease: Secondary | ICD-10-CM | POA: Diagnosis not present

## 2018-03-16 DIAGNOSIS — Z23 Encounter for immunization: Secondary | ICD-10-CM | POA: Diagnosis not present

## 2018-03-16 DIAGNOSIS — D631 Anemia in chronic kidney disease: Secondary | ICD-10-CM | POA: Diagnosis not present

## 2018-03-16 DIAGNOSIS — D509 Iron deficiency anemia, unspecified: Secondary | ICD-10-CM | POA: Diagnosis not present

## 2018-03-16 DIAGNOSIS — N2581 Secondary hyperparathyroidism of renal origin: Secondary | ICD-10-CM | POA: Diagnosis not present

## 2018-03-17 DIAGNOSIS — N186 End stage renal disease: Secondary | ICD-10-CM | POA: Diagnosis not present

## 2018-03-17 DIAGNOSIS — D631 Anemia in chronic kidney disease: Secondary | ICD-10-CM | POA: Diagnosis not present

## 2018-03-17 DIAGNOSIS — D509 Iron deficiency anemia, unspecified: Secondary | ICD-10-CM | POA: Diagnosis not present

## 2018-03-17 DIAGNOSIS — Z23 Encounter for immunization: Secondary | ICD-10-CM | POA: Diagnosis not present

## 2018-03-17 DIAGNOSIS — N2581 Secondary hyperparathyroidism of renal origin: Secondary | ICD-10-CM | POA: Diagnosis not present

## 2018-03-18 DIAGNOSIS — Z23 Encounter for immunization: Secondary | ICD-10-CM | POA: Diagnosis not present

## 2018-03-18 DIAGNOSIS — N186 End stage renal disease: Secondary | ICD-10-CM | POA: Diagnosis not present

## 2018-03-18 DIAGNOSIS — D631 Anemia in chronic kidney disease: Secondary | ICD-10-CM | POA: Diagnosis not present

## 2018-03-18 DIAGNOSIS — D509 Iron deficiency anemia, unspecified: Secondary | ICD-10-CM | POA: Diagnosis not present

## 2018-03-18 DIAGNOSIS — Z418 Encounter for other procedures for purposes other than remedying health state: Secondary | ICD-10-CM | POA: Diagnosis not present

## 2018-03-18 DIAGNOSIS — N2581 Secondary hyperparathyroidism of renal origin: Secondary | ICD-10-CM | POA: Diagnosis not present

## 2018-03-19 DIAGNOSIS — Z23 Encounter for immunization: Secondary | ICD-10-CM | POA: Diagnosis not present

## 2018-03-19 DIAGNOSIS — N2581 Secondary hyperparathyroidism of renal origin: Secondary | ICD-10-CM | POA: Diagnosis not present

## 2018-03-19 DIAGNOSIS — D631 Anemia in chronic kidney disease: Secondary | ICD-10-CM | POA: Diagnosis not present

## 2018-03-19 DIAGNOSIS — D509 Iron deficiency anemia, unspecified: Secondary | ICD-10-CM | POA: Diagnosis not present

## 2018-03-19 DIAGNOSIS — N186 End stage renal disease: Secondary | ICD-10-CM | POA: Diagnosis not present

## 2018-03-20 DIAGNOSIS — D631 Anemia in chronic kidney disease: Secondary | ICD-10-CM | POA: Diagnosis not present

## 2018-03-20 DIAGNOSIS — N186 End stage renal disease: Secondary | ICD-10-CM | POA: Diagnosis not present

## 2018-03-20 DIAGNOSIS — Z23 Encounter for immunization: Secondary | ICD-10-CM | POA: Diagnosis not present

## 2018-03-20 DIAGNOSIS — D509 Iron deficiency anemia, unspecified: Secondary | ICD-10-CM | POA: Diagnosis not present

## 2018-03-20 DIAGNOSIS — N2581 Secondary hyperparathyroidism of renal origin: Secondary | ICD-10-CM | POA: Diagnosis not present

## 2018-03-21 DIAGNOSIS — Z23 Encounter for immunization: Secondary | ICD-10-CM | POA: Diagnosis not present

## 2018-03-21 DIAGNOSIS — D509 Iron deficiency anemia, unspecified: Secondary | ICD-10-CM | POA: Diagnosis not present

## 2018-03-21 DIAGNOSIS — D631 Anemia in chronic kidney disease: Secondary | ICD-10-CM | POA: Diagnosis not present

## 2018-03-21 DIAGNOSIS — N2581 Secondary hyperparathyroidism of renal origin: Secondary | ICD-10-CM | POA: Diagnosis not present

## 2018-03-21 DIAGNOSIS — N186 End stage renal disease: Secondary | ICD-10-CM | POA: Diagnosis not present

## 2018-03-22 DIAGNOSIS — D509 Iron deficiency anemia, unspecified: Secondary | ICD-10-CM | POA: Diagnosis not present

## 2018-03-22 DIAGNOSIS — N2581 Secondary hyperparathyroidism of renal origin: Secondary | ICD-10-CM | POA: Diagnosis not present

## 2018-03-22 DIAGNOSIS — N186 End stage renal disease: Secondary | ICD-10-CM | POA: Diagnosis not present

## 2018-03-22 DIAGNOSIS — Z23 Encounter for immunization: Secondary | ICD-10-CM | POA: Diagnosis not present

## 2018-03-22 DIAGNOSIS — D631 Anemia in chronic kidney disease: Secondary | ICD-10-CM | POA: Diagnosis not present

## 2018-03-23 ENCOUNTER — Other Ambulatory Visit: Payer: Self-pay | Admitting: Sports Medicine

## 2018-03-23 DIAGNOSIS — N2581 Secondary hyperparathyroidism of renal origin: Secondary | ICD-10-CM | POA: Diagnosis not present

## 2018-03-23 DIAGNOSIS — D631 Anemia in chronic kidney disease: Secondary | ICD-10-CM | POA: Diagnosis not present

## 2018-03-23 DIAGNOSIS — Z23 Encounter for immunization: Secondary | ICD-10-CM | POA: Diagnosis not present

## 2018-03-23 DIAGNOSIS — D509 Iron deficiency anemia, unspecified: Secondary | ICD-10-CM | POA: Diagnosis not present

## 2018-03-23 DIAGNOSIS — N186 End stage renal disease: Secondary | ICD-10-CM | POA: Diagnosis not present

## 2018-03-24 DIAGNOSIS — N186 End stage renal disease: Secondary | ICD-10-CM | POA: Diagnosis not present

## 2018-03-24 DIAGNOSIS — D631 Anemia in chronic kidney disease: Secondary | ICD-10-CM | POA: Diagnosis not present

## 2018-03-24 DIAGNOSIS — D509 Iron deficiency anemia, unspecified: Secondary | ICD-10-CM | POA: Diagnosis not present

## 2018-03-24 DIAGNOSIS — Z23 Encounter for immunization: Secondary | ICD-10-CM | POA: Diagnosis not present

## 2018-03-24 DIAGNOSIS — N2581 Secondary hyperparathyroidism of renal origin: Secondary | ICD-10-CM | POA: Diagnosis not present

## 2018-03-25 DIAGNOSIS — Z23 Encounter for immunization: Secondary | ICD-10-CM | POA: Diagnosis not present

## 2018-03-25 DIAGNOSIS — D631 Anemia in chronic kidney disease: Secondary | ICD-10-CM | POA: Diagnosis not present

## 2018-03-25 DIAGNOSIS — N186 End stage renal disease: Secondary | ICD-10-CM | POA: Diagnosis not present

## 2018-03-25 DIAGNOSIS — N2581 Secondary hyperparathyroidism of renal origin: Secondary | ICD-10-CM | POA: Diagnosis not present

## 2018-03-25 DIAGNOSIS — D509 Iron deficiency anemia, unspecified: Secondary | ICD-10-CM | POA: Diagnosis not present

## 2018-03-26 ENCOUNTER — Encounter: Payer: Self-pay | Admitting: Sports Medicine

## 2018-03-26 ENCOUNTER — Ambulatory Visit (INDEPENDENT_AMBULATORY_CARE_PROVIDER_SITE_OTHER): Payer: Medicare Other | Admitting: Sports Medicine

## 2018-03-26 DIAGNOSIS — M65311 Trigger thumb, right thumb: Secondary | ICD-10-CM | POA: Diagnosis not present

## 2018-03-26 DIAGNOSIS — Z23 Encounter for immunization: Secondary | ICD-10-CM | POA: Diagnosis not present

## 2018-03-26 DIAGNOSIS — N2581 Secondary hyperparathyroidism of renal origin: Secondary | ICD-10-CM | POA: Diagnosis not present

## 2018-03-26 DIAGNOSIS — Z992 Dependence on renal dialysis: Secondary | ICD-10-CM | POA: Diagnosis not present

## 2018-03-26 DIAGNOSIS — D631 Anemia in chronic kidney disease: Secondary | ICD-10-CM | POA: Diagnosis not present

## 2018-03-26 DIAGNOSIS — N186 End stage renal disease: Secondary | ICD-10-CM | POA: Diagnosis not present

## 2018-03-26 DIAGNOSIS — D509 Iron deficiency anemia, unspecified: Secondary | ICD-10-CM | POA: Diagnosis not present

## 2018-03-26 MED ORDER — PIOGLITAZONE HCL 30 MG PO TABS: 30.0000 mg | ORAL_TABLET | Freq: Every day | ORAL | 0 refills | Status: DC

## 2018-03-26 NOTE — Assessment & Plan Note (Signed)
Painful with visible and palpable triggering. Flexor pollicis longus tendon sheath injection. Return to see me in 1 month.

## 2018-03-26 NOTE — Progress Notes (Signed)
Subjective:    CC: Thumb issue  HPI: For the past several weeks this pleasant 78 year old male has had triggering and pain, worsening, localized in the volar aspect of his right thumb, occasionally his thumb will get locked in flexion.  I reviewed the past medical history, family history, social history, surgical history, and allergies today and no changes were needed.  Please see the problem list section below in epic for further details.  Past Medical History: Past Medical History:  Diagnosis Date  . Chronic kidney disease   . Diabetes ([place])   . Hypertension    Past Surgical History: No past surgical history on file. Social History: Social History   Socioeconomic History  . Marital status: Married    Spouse name: Not on file  . Number of children: Not on file  . Years of education: Not on file  . Highest education level: Not on file  Occupational History  . Not on file  Social Needs  . Financial resource strain: Not on file  . Food insecurity:    Worry: Not on file    Inability: Not on file  . Transportation needs:    Medical: Not on file    Non-medical: Not on file  Tobacco Use  . Smoking status: Former Smoker    Packs/day: 1.00    Years: 20.00    Pack years: 20.00    Types: Cigars    Last attempt to quit: 09/06/1981    Years since quitting: 36.5  . Smokeless tobacco: Never Used  Substance and Sexual Activity  . Alcohol use: No  . Drug use: No  . Sexual activity: Yes    Partners: Female  Lifestyle  . Physical activity:    Days per week: Not on file    Minutes per session: Not on file  . Stress: Not on file  Relationships  . Social connections:    Talks on phone: Not on file    Gets together: Not on file    Attends religious service: Not on file    Active member of club or organization: Not on file    Attends meetings of clubs or organizations: Not on file    Relationship status: Not on file  Other Topics Concern  . Not on file  Social History  Narrative  . Not on file   Family History: Family History  Problem Relation Age of Onset  . Diabetes Mother   . Hypertension Mother   . Hypertension Father   . Heart attack Father 49       This is what he died from.  . Diabetes Sister   . Diabetes Brother   . Diabetes Brother   . Diabetes Sister    Allergies: Allergies  Allergen Reactions  . Hydralazine Hypertension    Odd worsening of HTN.   Medications: See med rec.  Review of Systems: No fevers, chills, night sweats, weight loss, chest pain, or shortness of breath.   Objective:    General: Well Developed, well nourished, and in no acute distress.  Neuro: Alert and oriented x3, extra-ocular muscles intact, sensation grossly intact.  HEENT: Normocephalic, atraumatic, pupils equal round reactive to light, neck supple, no masses, no lymphadenopathy, thyroid nonpalpable.  Skin: Warm and dry, no rashes. Cardiac: Regular rate and rhythm, no murmurs rubs or gallops, no lower extremity edema.  Respiratory: Clear to auscultation bilaterally. Not using accessory muscles, speaking in full sentences. Right hand: Palpable flexor tendon nodule with visible and palpable triggering of the  thumb.  Procedure: Real-time Ultrasound Guided Injection of right flexor pollicis longus tendon sheath Device: GE Logiq E  Verbal informed consent obtained.  Time-out conducted.  Noted no overlying erythema, induration, or other signs of local infection.  Skin prepped in a sterile fashion.  Local anesthesia: Topical Ethyl chloride.  With sterile technique and under real time ultrasound guidance: 25-gauge needle advanced into the flexor pollicis longus tendon sheath, I then injected 1/2 cc Kenalog 40, 1/2 cc lidocaine Completed without difficulty  Pain immediately resolved suggesting accurate placement of the medication.  Advised to call if fevers/chills, erythema, induration, drainage, or persistent bleeding.  Images permanently stored and available  for review in the ultrasound unit.  Impression: Technically successful ultrasound guided injection.  Impression and Recommendations:    Trigger thumb, right thumb Painful with visible and palpable triggering. Flexor pollicis longus tendon sheath injection. Return to see me in 1 month. ___________________________________________ Gwen Her. Dianah Field, M.D., ABFM., CAQSM. Primary Care and Sports Medicine [place] [facility]  Adjunct Professor of [place] of [facility] of Medicine

## 2018-03-26 NOTE — Patient Instructions (Signed)
Trigger Finger    Trigger finger (stenosing tenosynovitis) is a condition that causes a finger to get stuck in a bent position. Each finger has a tough, cord-like tissue that connects muscle to bone (tendon), and each tendon is surrounded by a tunnel of tissue (tendon sheath). To move your finger, your tendon needs to slide freely through the sheath. Trigger finger happens when the tendon or the sheath thickens, making it difficult to move your finger.  Trigger finger can affect any finger or a thumb. It may affect more than one finger. Mild cases may clear up with rest and medicine. Severe cases require more treatment.  What are the causes?  Trigger finger is caused by a thickened finger tendon or tendon sheath. The cause of this thickening is not known.  What increases the risk?  The following factors may make you more likely to develop this condition:   Doing activities that require a strong grip.   Having rheumatoid arthritis, gout, or diabetes.   Being 40-60 years old.   Being a woman.  What are the signs or symptoms?  Symptoms of this condition include:   Pain when bending or straightening your finger.   Tenderness or swelling where your finger attaches to the palm of your hand.   A lump in the palm of your hand or on the inside of your finger.   Hearing a popping sound when you try to straighten your finger.   Feeling a popping, catching, or locking sensation when you try to straighten your finger.   Being unable to straighten your finger.  How is this diagnosed?  This condition is diagnosed based on your symptoms and a physical exam.  How is this treated?  This condition may be treated by:   Resting your finger and avoiding activities that make symptoms worse.   Wearing a finger splint to keep your finger in a slightly bent position.   Taking NSAIDs to relieve pain and swelling.   Injecting medicine (steroids) into the tendon sheath to reduce swelling and irritation. Injections may need to be  repeated.   Having surgery to open the tendon sheath. This may be done if other treatments do not work and you cannot straighten your finger. You may need physical therapy after surgery.  Follow these instructions at home:     Use moist heat to help reduce pain and swelling as told by your health care provider.   Rest your finger and avoid activities that make pain worse. Return to normal activities as told by your health care provider.   If you have a splint, wear it as told by your health care provider.   Take over-the-counter and prescription medicines only as told by your health care provider.   Keep all follow-up visits as told by your health care provider. This is important.  Contact a health care provider if:   Your symptoms are not improving with home care.  Summary   Trigger finger (stenosing tenosynovitis) causes your finger to get stuck in a bent position, and it can make it difficult and painful to straighten your finger.   This condition develops when a finger tendon or tendon sheath thickens.   Treatment starts with resting, wearing a splint, and taking NSAIDs.   In severe cases, surgery to open the tendon sheath may be needed.  This information is not intended to replace advice given to you by your health care provider. Make sure you discuss any questions you have with your   health care provider.  Document Released: 12/01/2003 Document Revised: 01/22/2016 Document Reviewed: 01/22/2016  Elsevier Interactive Patient Education  2019 Elsevier Inc.

## 2018-03-27 DIAGNOSIS — N186 End stage renal disease: Secondary | ICD-10-CM | POA: Diagnosis not present

## 2018-03-27 DIAGNOSIS — D631 Anemia in chronic kidney disease: Secondary | ICD-10-CM | POA: Diagnosis not present

## 2018-03-27 DIAGNOSIS — N2581 Secondary hyperparathyroidism of renal origin: Secondary | ICD-10-CM | POA: Diagnosis not present

## 2018-03-27 DIAGNOSIS — D509 Iron deficiency anemia, unspecified: Secondary | ICD-10-CM | POA: Diagnosis not present

## 2018-03-28 DIAGNOSIS — D631 Anemia in chronic kidney disease: Secondary | ICD-10-CM | POA: Diagnosis not present

## 2018-03-28 DIAGNOSIS — D509 Iron deficiency anemia, unspecified: Secondary | ICD-10-CM | POA: Diagnosis not present

## 2018-03-28 DIAGNOSIS — N186 End stage renal disease: Secondary | ICD-10-CM | POA: Diagnosis not present

## 2018-03-28 DIAGNOSIS — N2581 Secondary hyperparathyroidism of renal origin: Secondary | ICD-10-CM | POA: Diagnosis not present

## 2018-03-29 DIAGNOSIS — D509 Iron deficiency anemia, unspecified: Secondary | ICD-10-CM | POA: Diagnosis not present

## 2018-03-29 DIAGNOSIS — N186 End stage renal disease: Secondary | ICD-10-CM | POA: Diagnosis not present

## 2018-03-29 DIAGNOSIS — N2581 Secondary hyperparathyroidism of renal origin: Secondary | ICD-10-CM | POA: Diagnosis not present

## 2018-03-29 DIAGNOSIS — D631 Anemia in chronic kidney disease: Secondary | ICD-10-CM | POA: Diagnosis not present

## 2018-03-30 DIAGNOSIS — N2581 Secondary hyperparathyroidism of renal origin: Secondary | ICD-10-CM | POA: Diagnosis not present

## 2018-03-30 DIAGNOSIS — N186 End stage renal disease: Secondary | ICD-10-CM | POA: Diagnosis not present

## 2018-03-30 DIAGNOSIS — D509 Iron deficiency anemia, unspecified: Secondary | ICD-10-CM | POA: Diagnosis not present

## 2018-03-30 DIAGNOSIS — D631 Anemia in chronic kidney disease: Secondary | ICD-10-CM | POA: Diagnosis not present

## 2018-03-31 DIAGNOSIS — N2581 Secondary hyperparathyroidism of renal origin: Secondary | ICD-10-CM | POA: Diagnosis not present

## 2018-03-31 DIAGNOSIS — D631 Anemia in chronic kidney disease: Secondary | ICD-10-CM | POA: Diagnosis not present

## 2018-03-31 DIAGNOSIS — D509 Iron deficiency anemia, unspecified: Secondary | ICD-10-CM | POA: Diagnosis not present

## 2018-03-31 DIAGNOSIS — N186 End stage renal disease: Secondary | ICD-10-CM | POA: Diagnosis not present

## 2018-04-01 DIAGNOSIS — N2581 Secondary hyperparathyroidism of renal origin: Secondary | ICD-10-CM | POA: Diagnosis not present

## 2018-04-01 DIAGNOSIS — D509 Iron deficiency anemia, unspecified: Secondary | ICD-10-CM | POA: Diagnosis not present

## 2018-04-01 DIAGNOSIS — D631 Anemia in chronic kidney disease: Secondary | ICD-10-CM | POA: Diagnosis not present

## 2018-04-01 DIAGNOSIS — N186 End stage renal disease: Secondary | ICD-10-CM | POA: Diagnosis not present

## 2018-04-02 DIAGNOSIS — N2581 Secondary hyperparathyroidism of renal origin: Secondary | ICD-10-CM | POA: Diagnosis not present

## 2018-04-02 DIAGNOSIS — N186 End stage renal disease: Secondary | ICD-10-CM | POA: Diagnosis not present

## 2018-04-02 DIAGNOSIS — D509 Iron deficiency anemia, unspecified: Secondary | ICD-10-CM | POA: Diagnosis not present

## 2018-04-02 DIAGNOSIS — D631 Anemia in chronic kidney disease: Secondary | ICD-10-CM | POA: Diagnosis not present

## 2018-04-03 DIAGNOSIS — D509 Iron deficiency anemia, unspecified: Secondary | ICD-10-CM | POA: Diagnosis not present

## 2018-04-03 DIAGNOSIS — D631 Anemia in chronic kidney disease: Secondary | ICD-10-CM | POA: Diagnosis not present

## 2018-04-03 DIAGNOSIS — N186 End stage renal disease: Secondary | ICD-10-CM | POA: Diagnosis not present

## 2018-04-03 DIAGNOSIS — N2581 Secondary hyperparathyroidism of renal origin: Secondary | ICD-10-CM | POA: Diagnosis not present

## 2018-04-04 DIAGNOSIS — N2581 Secondary hyperparathyroidism of renal origin: Secondary | ICD-10-CM | POA: Diagnosis not present

## 2018-04-04 DIAGNOSIS — D509 Iron deficiency anemia, unspecified: Secondary | ICD-10-CM | POA: Diagnosis not present

## 2018-04-04 DIAGNOSIS — N186 End stage renal disease: Secondary | ICD-10-CM | POA: Diagnosis not present

## 2018-04-04 DIAGNOSIS — D631 Anemia in chronic kidney disease: Secondary | ICD-10-CM | POA: Diagnosis not present

## 2018-04-05 DIAGNOSIS — D509 Iron deficiency anemia, unspecified: Secondary | ICD-10-CM | POA: Diagnosis not present

## 2018-04-05 DIAGNOSIS — D631 Anemia in chronic kidney disease: Secondary | ICD-10-CM | POA: Diagnosis not present

## 2018-04-05 DIAGNOSIS — N2581 Secondary hyperparathyroidism of renal origin: Secondary | ICD-10-CM | POA: Diagnosis not present

## 2018-04-05 DIAGNOSIS — N186 End stage renal disease: Secondary | ICD-10-CM | POA: Diagnosis not present

## 2018-04-06 DIAGNOSIS — D509 Iron deficiency anemia, unspecified: Secondary | ICD-10-CM | POA: Diagnosis not present

## 2018-04-06 DIAGNOSIS — D631 Anemia in chronic kidney disease: Secondary | ICD-10-CM | POA: Diagnosis not present

## 2018-04-06 DIAGNOSIS — N2581 Secondary hyperparathyroidism of renal origin: Secondary | ICD-10-CM | POA: Diagnosis not present

## 2018-04-06 DIAGNOSIS — N186 End stage renal disease: Secondary | ICD-10-CM | POA: Diagnosis not present

## 2018-04-07 DIAGNOSIS — D631 Anemia in chronic kidney disease: Secondary | ICD-10-CM | POA: Diagnosis not present

## 2018-04-07 DIAGNOSIS — N186 End stage renal disease: Secondary | ICD-10-CM | POA: Diagnosis not present

## 2018-04-07 DIAGNOSIS — N2581 Secondary hyperparathyroidism of renal origin: Secondary | ICD-10-CM | POA: Diagnosis not present

## 2018-04-07 DIAGNOSIS — D509 Iron deficiency anemia, unspecified: Secondary | ICD-10-CM | POA: Diagnosis not present

## 2018-04-08 DIAGNOSIS — D509 Iron deficiency anemia, unspecified: Secondary | ICD-10-CM | POA: Diagnosis not present

## 2018-04-08 DIAGNOSIS — D631 Anemia in chronic kidney disease: Secondary | ICD-10-CM | POA: Diagnosis not present

## 2018-04-08 DIAGNOSIS — N2581 Secondary hyperparathyroidism of renal origin: Secondary | ICD-10-CM | POA: Diagnosis not present

## 2018-04-08 DIAGNOSIS — N186 End stage renal disease: Secondary | ICD-10-CM | POA: Diagnosis not present

## 2018-04-09 DIAGNOSIS — N2581 Secondary hyperparathyroidism of renal origin: Secondary | ICD-10-CM | POA: Diagnosis not present

## 2018-04-09 DIAGNOSIS — D509 Iron deficiency anemia, unspecified: Secondary | ICD-10-CM | POA: Diagnosis not present

## 2018-04-09 DIAGNOSIS — N186 End stage renal disease: Secondary | ICD-10-CM | POA: Diagnosis not present

## 2018-04-09 DIAGNOSIS — D631 Anemia in chronic kidney disease: Secondary | ICD-10-CM | POA: Diagnosis not present

## 2018-04-10 DIAGNOSIS — D509 Iron deficiency anemia, unspecified: Secondary | ICD-10-CM | POA: Diagnosis not present

## 2018-04-10 DIAGNOSIS — D631 Anemia in chronic kidney disease: Secondary | ICD-10-CM | POA: Diagnosis not present

## 2018-04-10 DIAGNOSIS — N186 End stage renal disease: Secondary | ICD-10-CM | POA: Diagnosis not present

## 2018-04-10 DIAGNOSIS — N2581 Secondary hyperparathyroidism of renal origin: Secondary | ICD-10-CM | POA: Diagnosis not present

## 2018-04-11 DIAGNOSIS — N186 End stage renal disease: Secondary | ICD-10-CM | POA: Diagnosis not present

## 2018-04-11 DIAGNOSIS — N2581 Secondary hyperparathyroidism of renal origin: Secondary | ICD-10-CM | POA: Diagnosis not present

## 2018-04-11 DIAGNOSIS — D631 Anemia in chronic kidney disease: Secondary | ICD-10-CM | POA: Diagnosis not present

## 2018-04-11 DIAGNOSIS — D509 Iron deficiency anemia, unspecified: Secondary | ICD-10-CM | POA: Diagnosis not present

## 2018-04-12 DIAGNOSIS — D631 Anemia in chronic kidney disease: Secondary | ICD-10-CM | POA: Diagnosis not present

## 2018-04-12 DIAGNOSIS — N186 End stage renal disease: Secondary | ICD-10-CM | POA: Diagnosis not present

## 2018-04-12 DIAGNOSIS — D509 Iron deficiency anemia, unspecified: Secondary | ICD-10-CM | POA: Diagnosis not present

## 2018-04-12 DIAGNOSIS — N2581 Secondary hyperparathyroidism of renal origin: Secondary | ICD-10-CM | POA: Diagnosis not present

## 2018-04-13 DIAGNOSIS — N186 End stage renal disease: Secondary | ICD-10-CM | POA: Diagnosis not present

## 2018-04-13 DIAGNOSIS — D631 Anemia in chronic kidney disease: Secondary | ICD-10-CM | POA: Diagnosis not present

## 2018-04-13 DIAGNOSIS — D509 Iron deficiency anemia, unspecified: Secondary | ICD-10-CM | POA: Diagnosis not present

## 2018-04-13 DIAGNOSIS — N2581 Secondary hyperparathyroidism of renal origin: Secondary | ICD-10-CM | POA: Diagnosis not present

## 2018-04-14 DIAGNOSIS — N186 End stage renal disease: Secondary | ICD-10-CM | POA: Diagnosis not present

## 2018-04-14 DIAGNOSIS — N2581 Secondary hyperparathyroidism of renal origin: Secondary | ICD-10-CM | POA: Diagnosis not present

## 2018-04-14 DIAGNOSIS — D631 Anemia in chronic kidney disease: Secondary | ICD-10-CM | POA: Diagnosis not present

## 2018-04-14 DIAGNOSIS — D509 Iron deficiency anemia, unspecified: Secondary | ICD-10-CM | POA: Diagnosis not present

## 2018-04-15 DIAGNOSIS — E118 Type 2 diabetes mellitus with unspecified complications: Secondary | ICD-10-CM | POA: Diagnosis not present

## 2018-04-15 DIAGNOSIS — D509 Iron deficiency anemia, unspecified: Secondary | ICD-10-CM | POA: Diagnosis not present

## 2018-04-15 DIAGNOSIS — D631 Anemia in chronic kidney disease: Secondary | ICD-10-CM | POA: Diagnosis not present

## 2018-04-15 DIAGNOSIS — N2581 Secondary hyperparathyroidism of renal origin: Secondary | ICD-10-CM | POA: Diagnosis not present

## 2018-04-15 DIAGNOSIS — N186 End stage renal disease: Secondary | ICD-10-CM | POA: Diagnosis not present

## 2018-04-16 DIAGNOSIS — N2581 Secondary hyperparathyroidism of renal origin: Secondary | ICD-10-CM | POA: Diagnosis not present

## 2018-04-16 DIAGNOSIS — D509 Iron deficiency anemia, unspecified: Secondary | ICD-10-CM | POA: Diagnosis not present

## 2018-04-16 DIAGNOSIS — N186 End stage renal disease: Secondary | ICD-10-CM | POA: Diagnosis not present

## 2018-04-16 DIAGNOSIS — D631 Anemia in chronic kidney disease: Secondary | ICD-10-CM | POA: Diagnosis not present

## 2018-04-17 DIAGNOSIS — N186 End stage renal disease: Secondary | ICD-10-CM | POA: Diagnosis not present

## 2018-04-17 DIAGNOSIS — D631 Anemia in chronic kidney disease: Secondary | ICD-10-CM | POA: Diagnosis not present

## 2018-04-17 DIAGNOSIS — D509 Iron deficiency anemia, unspecified: Secondary | ICD-10-CM | POA: Diagnosis not present

## 2018-04-17 DIAGNOSIS — N2581 Secondary hyperparathyroidism of renal origin: Secondary | ICD-10-CM | POA: Diagnosis not present

## 2018-04-18 DIAGNOSIS — N186 End stage renal disease: Secondary | ICD-10-CM | POA: Diagnosis not present

## 2018-04-18 DIAGNOSIS — D631 Anemia in chronic kidney disease: Secondary | ICD-10-CM | POA: Diagnosis not present

## 2018-04-18 DIAGNOSIS — N2581 Secondary hyperparathyroidism of renal origin: Secondary | ICD-10-CM | POA: Diagnosis not present

## 2018-04-18 DIAGNOSIS — D509 Iron deficiency anemia, unspecified: Secondary | ICD-10-CM | POA: Diagnosis not present

## 2018-04-19 DIAGNOSIS — N2581 Secondary hyperparathyroidism of renal origin: Secondary | ICD-10-CM | POA: Diagnosis not present

## 2018-04-19 DIAGNOSIS — D631 Anemia in chronic kidney disease: Secondary | ICD-10-CM | POA: Diagnosis not present

## 2018-04-19 DIAGNOSIS — D509 Iron deficiency anemia, unspecified: Secondary | ICD-10-CM | POA: Diagnosis not present

## 2018-04-19 DIAGNOSIS — N186 End stage renal disease: Secondary | ICD-10-CM | POA: Diagnosis not present

## 2018-04-20 DIAGNOSIS — N186 End stage renal disease: Secondary | ICD-10-CM | POA: Diagnosis not present

## 2018-04-20 DIAGNOSIS — N2581 Secondary hyperparathyroidism of renal origin: Secondary | ICD-10-CM | POA: Diagnosis not present

## 2018-04-20 DIAGNOSIS — D509 Iron deficiency anemia, unspecified: Secondary | ICD-10-CM | POA: Diagnosis not present

## 2018-04-20 DIAGNOSIS — D631 Anemia in chronic kidney disease: Secondary | ICD-10-CM | POA: Diagnosis not present

## 2018-04-21 DIAGNOSIS — N186 End stage renal disease: Secondary | ICD-10-CM | POA: Diagnosis not present

## 2018-04-21 DIAGNOSIS — D631 Anemia in chronic kidney disease: Secondary | ICD-10-CM | POA: Diagnosis not present

## 2018-04-21 DIAGNOSIS — D509 Iron deficiency anemia, unspecified: Secondary | ICD-10-CM | POA: Diagnosis not present

## 2018-04-21 DIAGNOSIS — N2581 Secondary hyperparathyroidism of renal origin: Secondary | ICD-10-CM | POA: Diagnosis not present

## 2018-04-22 DIAGNOSIS — N2581 Secondary hyperparathyroidism of renal origin: Secondary | ICD-10-CM | POA: Diagnosis not present

## 2018-04-22 DIAGNOSIS — D509 Iron deficiency anemia, unspecified: Secondary | ICD-10-CM | POA: Diagnosis not present

## 2018-04-22 DIAGNOSIS — N186 End stage renal disease: Secondary | ICD-10-CM | POA: Diagnosis not present

## 2018-04-22 DIAGNOSIS — D631 Anemia in chronic kidney disease: Secondary | ICD-10-CM | POA: Diagnosis not present

## 2018-04-23 DIAGNOSIS — D509 Iron deficiency anemia, unspecified: Secondary | ICD-10-CM | POA: Diagnosis not present

## 2018-04-23 DIAGNOSIS — N186 End stage renal disease: Secondary | ICD-10-CM | POA: Diagnosis not present

## 2018-04-23 DIAGNOSIS — N2581 Secondary hyperparathyroidism of renal origin: Secondary | ICD-10-CM | POA: Diagnosis not present

## 2018-04-23 DIAGNOSIS — D631 Anemia in chronic kidney disease: Secondary | ICD-10-CM | POA: Diagnosis not present

## 2018-04-24 DIAGNOSIS — D631 Anemia in chronic kidney disease: Secondary | ICD-10-CM | POA: Diagnosis not present

## 2018-04-24 DIAGNOSIS — N186 End stage renal disease: Secondary | ICD-10-CM | POA: Diagnosis not present

## 2018-04-24 DIAGNOSIS — N2581 Secondary hyperparathyroidism of renal origin: Secondary | ICD-10-CM | POA: Diagnosis not present

## 2018-04-24 DIAGNOSIS — Z992 Dependence on renal dialysis: Secondary | ICD-10-CM | POA: Diagnosis not present

## 2018-04-24 DIAGNOSIS — D509 Iron deficiency anemia, unspecified: Secondary | ICD-10-CM | POA: Diagnosis not present

## 2018-04-25 DIAGNOSIS — D631 Anemia in chronic kidney disease: Secondary | ICD-10-CM | POA: Diagnosis not present

## 2018-04-25 DIAGNOSIS — D509 Iron deficiency anemia, unspecified: Secondary | ICD-10-CM | POA: Diagnosis not present

## 2018-04-25 DIAGNOSIS — Z23 Encounter for immunization: Secondary | ICD-10-CM | POA: Diagnosis not present

## 2018-04-25 DIAGNOSIS — N2581 Secondary hyperparathyroidism of renal origin: Secondary | ICD-10-CM | POA: Diagnosis not present

## 2018-04-25 DIAGNOSIS — N186 End stage renal disease: Secondary | ICD-10-CM | POA: Diagnosis not present

## 2018-04-26 ENCOUNTER — Ambulatory Visit: Payer: Medicare Other | Admitting: Sports Medicine

## 2018-04-26 DIAGNOSIS — N186 End stage renal disease: Secondary | ICD-10-CM | POA: Diagnosis not present

## 2018-04-26 DIAGNOSIS — N2581 Secondary hyperparathyroidism of renal origin: Secondary | ICD-10-CM | POA: Diagnosis not present

## 2018-04-26 DIAGNOSIS — D509 Iron deficiency anemia, unspecified: Secondary | ICD-10-CM | POA: Diagnosis not present

## 2018-04-26 DIAGNOSIS — D631 Anemia in chronic kidney disease: Secondary | ICD-10-CM | POA: Diagnosis not present

## 2018-04-26 DIAGNOSIS — Z23 Encounter for immunization: Secondary | ICD-10-CM | POA: Diagnosis not present

## 2018-04-27 DIAGNOSIS — N2581 Secondary hyperparathyroidism of renal origin: Secondary | ICD-10-CM | POA: Diagnosis not present

## 2018-04-27 DIAGNOSIS — Z23 Encounter for immunization: Secondary | ICD-10-CM | POA: Diagnosis not present

## 2018-04-27 DIAGNOSIS — N186 End stage renal disease: Secondary | ICD-10-CM | POA: Diagnosis not present

## 2018-04-27 DIAGNOSIS — D509 Iron deficiency anemia, unspecified: Secondary | ICD-10-CM | POA: Diagnosis not present

## 2018-04-27 DIAGNOSIS — D631 Anemia in chronic kidney disease: Secondary | ICD-10-CM | POA: Diagnosis not present

## 2018-04-28 DIAGNOSIS — Z23 Encounter for immunization: Secondary | ICD-10-CM | POA: Diagnosis not present

## 2018-04-28 DIAGNOSIS — D509 Iron deficiency anemia, unspecified: Secondary | ICD-10-CM | POA: Diagnosis not present

## 2018-04-28 DIAGNOSIS — D631 Anemia in chronic kidney disease: Secondary | ICD-10-CM | POA: Diagnosis not present

## 2018-04-28 DIAGNOSIS — N2581 Secondary hyperparathyroidism of renal origin: Secondary | ICD-10-CM | POA: Diagnosis not present

## 2018-04-28 DIAGNOSIS — N186 End stage renal disease: Secondary | ICD-10-CM | POA: Diagnosis not present

## 2018-04-29 DIAGNOSIS — Z23 Encounter for immunization: Secondary | ICD-10-CM | POA: Diagnosis not present

## 2018-04-29 DIAGNOSIS — D631 Anemia in chronic kidney disease: Secondary | ICD-10-CM | POA: Diagnosis not present

## 2018-04-29 DIAGNOSIS — N2581 Secondary hyperparathyroidism of renal origin: Secondary | ICD-10-CM | POA: Diagnosis not present

## 2018-04-29 DIAGNOSIS — D509 Iron deficiency anemia, unspecified: Secondary | ICD-10-CM | POA: Diagnosis not present

## 2018-04-29 DIAGNOSIS — N186 End stage renal disease: Secondary | ICD-10-CM | POA: Diagnosis not present

## 2018-04-30 DIAGNOSIS — N2581 Secondary hyperparathyroidism of renal origin: Secondary | ICD-10-CM | POA: Diagnosis not present

## 2018-04-30 DIAGNOSIS — Z23 Encounter for immunization: Secondary | ICD-10-CM | POA: Diagnosis not present

## 2018-04-30 DIAGNOSIS — D509 Iron deficiency anemia, unspecified: Secondary | ICD-10-CM | POA: Diagnosis not present

## 2018-04-30 DIAGNOSIS — N186 End stage renal disease: Secondary | ICD-10-CM | POA: Diagnosis not present

## 2018-04-30 DIAGNOSIS — D631 Anemia in chronic kidney disease: Secondary | ICD-10-CM | POA: Diagnosis not present

## 2018-05-01 DIAGNOSIS — N2581 Secondary hyperparathyroidism of renal origin: Secondary | ICD-10-CM | POA: Diagnosis not present

## 2018-05-01 DIAGNOSIS — D509 Iron deficiency anemia, unspecified: Secondary | ICD-10-CM | POA: Diagnosis not present

## 2018-05-01 DIAGNOSIS — D631 Anemia in chronic kidney disease: Secondary | ICD-10-CM | POA: Diagnosis not present

## 2018-05-01 DIAGNOSIS — Z23 Encounter for immunization: Secondary | ICD-10-CM | POA: Diagnosis not present

## 2018-05-01 DIAGNOSIS — N186 End stage renal disease: Secondary | ICD-10-CM | POA: Diagnosis not present

## 2018-05-02 DIAGNOSIS — D631 Anemia in chronic kidney disease: Secondary | ICD-10-CM | POA: Diagnosis not present

## 2018-05-02 DIAGNOSIS — N2581 Secondary hyperparathyroidism of renal origin: Secondary | ICD-10-CM | POA: Diagnosis not present

## 2018-05-02 DIAGNOSIS — Z23 Encounter for immunization: Secondary | ICD-10-CM | POA: Diagnosis not present

## 2018-05-02 DIAGNOSIS — N186 End stage renal disease: Secondary | ICD-10-CM | POA: Diagnosis not present

## 2018-05-02 DIAGNOSIS — D509 Iron deficiency anemia, unspecified: Secondary | ICD-10-CM | POA: Diagnosis not present

## 2018-05-03 DIAGNOSIS — D631 Anemia in chronic kidney disease: Secondary | ICD-10-CM | POA: Diagnosis not present

## 2018-05-03 DIAGNOSIS — Z23 Encounter for immunization: Secondary | ICD-10-CM | POA: Diagnosis not present

## 2018-05-03 DIAGNOSIS — N2581 Secondary hyperparathyroidism of renal origin: Secondary | ICD-10-CM | POA: Diagnosis not present

## 2018-05-03 DIAGNOSIS — N186 End stage renal disease: Secondary | ICD-10-CM | POA: Diagnosis not present

## 2018-05-03 DIAGNOSIS — D509 Iron deficiency anemia, unspecified: Secondary | ICD-10-CM | POA: Diagnosis not present

## 2018-05-04 DIAGNOSIS — N186 End stage renal disease: Secondary | ICD-10-CM | POA: Diagnosis not present

## 2018-05-04 DIAGNOSIS — D509 Iron deficiency anemia, unspecified: Secondary | ICD-10-CM | POA: Diagnosis not present

## 2018-05-04 DIAGNOSIS — D631 Anemia in chronic kidney disease: Secondary | ICD-10-CM | POA: Diagnosis not present

## 2018-05-04 DIAGNOSIS — Z23 Encounter for immunization: Secondary | ICD-10-CM | POA: Diagnosis not present

## 2018-05-04 DIAGNOSIS — N2581 Secondary hyperparathyroidism of renal origin: Secondary | ICD-10-CM | POA: Diagnosis not present

## 2018-05-05 DIAGNOSIS — N186 End stage renal disease: Secondary | ICD-10-CM | POA: Diagnosis not present

## 2018-05-05 DIAGNOSIS — N2581 Secondary hyperparathyroidism of renal origin: Secondary | ICD-10-CM | POA: Diagnosis not present

## 2018-05-05 DIAGNOSIS — D631 Anemia in chronic kidney disease: Secondary | ICD-10-CM | POA: Diagnosis not present

## 2018-05-05 DIAGNOSIS — D509 Iron deficiency anemia, unspecified: Secondary | ICD-10-CM | POA: Diagnosis not present

## 2018-05-05 DIAGNOSIS — Z23 Encounter for immunization: Secondary | ICD-10-CM | POA: Diagnosis not present

## 2018-05-06 DIAGNOSIS — Z23 Encounter for immunization: Secondary | ICD-10-CM | POA: Diagnosis not present

## 2018-05-06 DIAGNOSIS — D509 Iron deficiency anemia, unspecified: Secondary | ICD-10-CM | POA: Diagnosis not present

## 2018-05-06 DIAGNOSIS — N2581 Secondary hyperparathyroidism of renal origin: Secondary | ICD-10-CM | POA: Diagnosis not present

## 2018-05-06 DIAGNOSIS — D631 Anemia in chronic kidney disease: Secondary | ICD-10-CM | POA: Diagnosis not present

## 2018-05-06 DIAGNOSIS — N186 End stage renal disease: Secondary | ICD-10-CM | POA: Diagnosis not present

## 2018-05-07 DIAGNOSIS — D509 Iron deficiency anemia, unspecified: Secondary | ICD-10-CM | POA: Diagnosis not present

## 2018-05-07 DIAGNOSIS — N186 End stage renal disease: Secondary | ICD-10-CM | POA: Diagnosis not present

## 2018-05-07 DIAGNOSIS — Z23 Encounter for immunization: Secondary | ICD-10-CM | POA: Diagnosis not present

## 2018-05-07 DIAGNOSIS — E118 Type 2 diabetes mellitus with unspecified complications: Secondary | ICD-10-CM | POA: Diagnosis not present

## 2018-05-07 DIAGNOSIS — N2581 Secondary hyperparathyroidism of renal origin: Secondary | ICD-10-CM | POA: Diagnosis not present

## 2018-05-07 DIAGNOSIS — D631 Anemia in chronic kidney disease: Secondary | ICD-10-CM | POA: Diagnosis not present

## 2018-05-08 DIAGNOSIS — D509 Iron deficiency anemia, unspecified: Secondary | ICD-10-CM | POA: Diagnosis not present

## 2018-05-08 DIAGNOSIS — N186 End stage renal disease: Secondary | ICD-10-CM | POA: Diagnosis not present

## 2018-05-08 DIAGNOSIS — N2581 Secondary hyperparathyroidism of renal origin: Secondary | ICD-10-CM | POA: Diagnosis not present

## 2018-05-08 DIAGNOSIS — D631 Anemia in chronic kidney disease: Secondary | ICD-10-CM | POA: Diagnosis not present

## 2018-05-08 DIAGNOSIS — Z23 Encounter for immunization: Secondary | ICD-10-CM | POA: Diagnosis not present

## 2018-05-09 DIAGNOSIS — N186 End stage renal disease: Secondary | ICD-10-CM | POA: Diagnosis not present

## 2018-05-09 DIAGNOSIS — N2581 Secondary hyperparathyroidism of renal origin: Secondary | ICD-10-CM | POA: Diagnosis not present

## 2018-05-09 DIAGNOSIS — Z23 Encounter for immunization: Secondary | ICD-10-CM | POA: Diagnosis not present

## 2018-05-09 DIAGNOSIS — D631 Anemia in chronic kidney disease: Secondary | ICD-10-CM | POA: Diagnosis not present

## 2018-05-09 DIAGNOSIS — D509 Iron deficiency anemia, unspecified: Secondary | ICD-10-CM | POA: Diagnosis not present

## 2018-05-10 DIAGNOSIS — D631 Anemia in chronic kidney disease: Secondary | ICD-10-CM | POA: Diagnosis not present

## 2018-05-10 DIAGNOSIS — D509 Iron deficiency anemia, unspecified: Secondary | ICD-10-CM | POA: Diagnosis not present

## 2018-05-10 DIAGNOSIS — N186 End stage renal disease: Secondary | ICD-10-CM | POA: Diagnosis not present

## 2018-05-10 DIAGNOSIS — Z23 Encounter for immunization: Secondary | ICD-10-CM | POA: Diagnosis not present

## 2018-05-10 DIAGNOSIS — N2581 Secondary hyperparathyroidism of renal origin: Secondary | ICD-10-CM | POA: Diagnosis not present

## 2018-05-11 DIAGNOSIS — D509 Iron deficiency anemia, unspecified: Secondary | ICD-10-CM | POA: Diagnosis not present

## 2018-05-11 DIAGNOSIS — N2581 Secondary hyperparathyroidism of renal origin: Secondary | ICD-10-CM | POA: Diagnosis not present

## 2018-05-11 DIAGNOSIS — Z23 Encounter for immunization: Secondary | ICD-10-CM | POA: Diagnosis not present

## 2018-05-11 DIAGNOSIS — D631 Anemia in chronic kidney disease: Secondary | ICD-10-CM | POA: Diagnosis not present

## 2018-05-11 DIAGNOSIS — N186 End stage renal disease: Secondary | ICD-10-CM | POA: Diagnosis not present

## 2018-05-12 DIAGNOSIS — Z23 Encounter for immunization: Secondary | ICD-10-CM | POA: Diagnosis not present

## 2018-05-12 DIAGNOSIS — N186 End stage renal disease: Secondary | ICD-10-CM | POA: Diagnosis not present

## 2018-05-12 DIAGNOSIS — D509 Iron deficiency anemia, unspecified: Secondary | ICD-10-CM | POA: Diagnosis not present

## 2018-05-12 DIAGNOSIS — D631 Anemia in chronic kidney disease: Secondary | ICD-10-CM | POA: Diagnosis not present

## 2018-05-12 DIAGNOSIS — N2581 Secondary hyperparathyroidism of renal origin: Secondary | ICD-10-CM | POA: Diagnosis not present

## 2018-05-13 DIAGNOSIS — D509 Iron deficiency anemia, unspecified: Secondary | ICD-10-CM | POA: Diagnosis not present

## 2018-05-13 DIAGNOSIS — D631 Anemia in chronic kidney disease: Secondary | ICD-10-CM | POA: Diagnosis not present

## 2018-05-13 DIAGNOSIS — N186 End stage renal disease: Secondary | ICD-10-CM | POA: Diagnosis not present

## 2018-05-13 DIAGNOSIS — N2581 Secondary hyperparathyroidism of renal origin: Secondary | ICD-10-CM | POA: Diagnosis not present

## 2018-05-13 DIAGNOSIS — Z23 Encounter for immunization: Secondary | ICD-10-CM | POA: Diagnosis not present

## 2018-05-14 DIAGNOSIS — D631 Anemia in chronic kidney disease: Secondary | ICD-10-CM | POA: Diagnosis not present

## 2018-05-14 DIAGNOSIS — Z23 Encounter for immunization: Secondary | ICD-10-CM | POA: Diagnosis not present

## 2018-05-14 DIAGNOSIS — N186 End stage renal disease: Secondary | ICD-10-CM | POA: Diagnosis not present

## 2018-05-14 DIAGNOSIS — D509 Iron deficiency anemia, unspecified: Secondary | ICD-10-CM | POA: Diagnosis not present

## 2018-05-14 DIAGNOSIS — N2581 Secondary hyperparathyroidism of renal origin: Secondary | ICD-10-CM | POA: Diagnosis not present

## 2018-05-15 DIAGNOSIS — D631 Anemia in chronic kidney disease: Secondary | ICD-10-CM | POA: Diagnosis not present

## 2018-05-15 DIAGNOSIS — N186 End stage renal disease: Secondary | ICD-10-CM | POA: Diagnosis not present

## 2018-05-15 DIAGNOSIS — D509 Iron deficiency anemia, unspecified: Secondary | ICD-10-CM | POA: Diagnosis not present

## 2018-05-15 DIAGNOSIS — Z23 Encounter for immunization: Secondary | ICD-10-CM | POA: Diagnosis not present

## 2018-05-15 DIAGNOSIS — N2581 Secondary hyperparathyroidism of renal origin: Secondary | ICD-10-CM | POA: Diagnosis not present

## 2018-05-16 DIAGNOSIS — D509 Iron deficiency anemia, unspecified: Secondary | ICD-10-CM | POA: Diagnosis not present

## 2018-05-16 DIAGNOSIS — N2581 Secondary hyperparathyroidism of renal origin: Secondary | ICD-10-CM | POA: Diagnosis not present

## 2018-05-16 DIAGNOSIS — D631 Anemia in chronic kidney disease: Secondary | ICD-10-CM | POA: Diagnosis not present

## 2018-05-16 DIAGNOSIS — Z23 Encounter for immunization: Secondary | ICD-10-CM | POA: Diagnosis not present

## 2018-05-16 DIAGNOSIS — N186 End stage renal disease: Secondary | ICD-10-CM | POA: Diagnosis not present

## 2018-05-17 DIAGNOSIS — D509 Iron deficiency anemia, unspecified: Secondary | ICD-10-CM | POA: Diagnosis not present

## 2018-05-17 DIAGNOSIS — N186 End stage renal disease: Secondary | ICD-10-CM | POA: Diagnosis not present

## 2018-05-17 DIAGNOSIS — Z23 Encounter for immunization: Secondary | ICD-10-CM | POA: Diagnosis not present

## 2018-05-17 DIAGNOSIS — D631 Anemia in chronic kidney disease: Secondary | ICD-10-CM | POA: Diagnosis not present

## 2018-05-17 DIAGNOSIS — N2581 Secondary hyperparathyroidism of renal origin: Secondary | ICD-10-CM | POA: Diagnosis not present

## 2018-05-18 DIAGNOSIS — N186 End stage renal disease: Secondary | ICD-10-CM | POA: Diagnosis not present

## 2018-05-18 DIAGNOSIS — D631 Anemia in chronic kidney disease: Secondary | ICD-10-CM | POA: Diagnosis not present

## 2018-05-18 DIAGNOSIS — Z23 Encounter for immunization: Secondary | ICD-10-CM | POA: Diagnosis not present

## 2018-05-18 DIAGNOSIS — N2581 Secondary hyperparathyroidism of renal origin: Secondary | ICD-10-CM | POA: Diagnosis not present

## 2018-05-18 DIAGNOSIS — D509 Iron deficiency anemia, unspecified: Secondary | ICD-10-CM | POA: Diagnosis not present

## 2018-05-19 DIAGNOSIS — N186 End stage renal disease: Secondary | ICD-10-CM | POA: Diagnosis not present

## 2018-05-19 DIAGNOSIS — Z23 Encounter for immunization: Secondary | ICD-10-CM | POA: Diagnosis not present

## 2018-05-19 DIAGNOSIS — N2581 Secondary hyperparathyroidism of renal origin: Secondary | ICD-10-CM | POA: Diagnosis not present

## 2018-05-19 DIAGNOSIS — D631 Anemia in chronic kidney disease: Secondary | ICD-10-CM | POA: Diagnosis not present

## 2018-05-19 DIAGNOSIS — D509 Iron deficiency anemia, unspecified: Secondary | ICD-10-CM | POA: Diagnosis not present

## 2018-05-20 DIAGNOSIS — D631 Anemia in chronic kidney disease: Secondary | ICD-10-CM | POA: Diagnosis not present

## 2018-05-20 DIAGNOSIS — D509 Iron deficiency anemia, unspecified: Secondary | ICD-10-CM | POA: Diagnosis not present

## 2018-05-20 DIAGNOSIS — N2581 Secondary hyperparathyroidism of renal origin: Secondary | ICD-10-CM | POA: Diagnosis not present

## 2018-05-20 DIAGNOSIS — Z23 Encounter for immunization: Secondary | ICD-10-CM | POA: Diagnosis not present

## 2018-05-20 DIAGNOSIS — N186 End stage renal disease: Secondary | ICD-10-CM | POA: Diagnosis not present

## 2018-05-21 DIAGNOSIS — N186 End stage renal disease: Secondary | ICD-10-CM | POA: Diagnosis not present

## 2018-05-21 DIAGNOSIS — D631 Anemia in chronic kidney disease: Secondary | ICD-10-CM | POA: Diagnosis not present

## 2018-05-21 DIAGNOSIS — Z23 Encounter for immunization: Secondary | ICD-10-CM | POA: Diagnosis not present

## 2018-05-21 DIAGNOSIS — N2581 Secondary hyperparathyroidism of renal origin: Secondary | ICD-10-CM | POA: Diagnosis not present

## 2018-05-21 DIAGNOSIS — D509 Iron deficiency anemia, unspecified: Secondary | ICD-10-CM | POA: Diagnosis not present

## 2018-05-22 DIAGNOSIS — N2581 Secondary hyperparathyroidism of renal origin: Secondary | ICD-10-CM | POA: Diagnosis not present

## 2018-05-22 DIAGNOSIS — D509 Iron deficiency anemia, unspecified: Secondary | ICD-10-CM | POA: Diagnosis not present

## 2018-05-22 DIAGNOSIS — D631 Anemia in chronic kidney disease: Secondary | ICD-10-CM | POA: Diagnosis not present

## 2018-05-22 DIAGNOSIS — N186 End stage renal disease: Secondary | ICD-10-CM | POA: Diagnosis not present

## 2018-05-22 DIAGNOSIS — Z23 Encounter for immunization: Secondary | ICD-10-CM | POA: Diagnosis not present

## 2018-05-23 DIAGNOSIS — D509 Iron deficiency anemia, unspecified: Secondary | ICD-10-CM | POA: Diagnosis not present

## 2018-05-23 DIAGNOSIS — N186 End stage renal disease: Secondary | ICD-10-CM | POA: Diagnosis not present

## 2018-05-23 DIAGNOSIS — D631 Anemia in chronic kidney disease: Secondary | ICD-10-CM | POA: Diagnosis not present

## 2018-05-23 DIAGNOSIS — N2581 Secondary hyperparathyroidism of renal origin: Secondary | ICD-10-CM | POA: Diagnosis not present

## 2018-05-23 DIAGNOSIS — Z23 Encounter for immunization: Secondary | ICD-10-CM | POA: Diagnosis not present

## 2018-05-24 DIAGNOSIS — D509 Iron deficiency anemia, unspecified: Secondary | ICD-10-CM | POA: Diagnosis not present

## 2018-05-24 DIAGNOSIS — N186 End stage renal disease: Secondary | ICD-10-CM | POA: Diagnosis not present

## 2018-05-24 DIAGNOSIS — N2581 Secondary hyperparathyroidism of renal origin: Secondary | ICD-10-CM | POA: Diagnosis not present

## 2018-05-24 DIAGNOSIS — D631 Anemia in chronic kidney disease: Secondary | ICD-10-CM | POA: Diagnosis not present

## 2018-05-24 DIAGNOSIS — Z23 Encounter for immunization: Secondary | ICD-10-CM | POA: Diagnosis not present

## 2018-05-25 DIAGNOSIS — Z992 Dependence on renal dialysis: Secondary | ICD-10-CM | POA: Diagnosis not present

## 2018-05-25 DIAGNOSIS — D631 Anemia in chronic kidney disease: Secondary | ICD-10-CM | POA: Diagnosis not present

## 2018-05-25 DIAGNOSIS — N2581 Secondary hyperparathyroidism of renal origin: Secondary | ICD-10-CM | POA: Diagnosis not present

## 2018-05-25 DIAGNOSIS — D509 Iron deficiency anemia, unspecified: Secondary | ICD-10-CM | POA: Diagnosis not present

## 2018-05-25 DIAGNOSIS — Z23 Encounter for immunization: Secondary | ICD-10-CM | POA: Diagnosis not present

## 2018-05-25 DIAGNOSIS — N186 End stage renal disease: Secondary | ICD-10-CM | POA: Diagnosis not present

## 2018-05-26 DIAGNOSIS — N186 End stage renal disease: Secondary | ICD-10-CM | POA: Diagnosis not present

## 2018-05-26 DIAGNOSIS — D509 Iron deficiency anemia, unspecified: Secondary | ICD-10-CM | POA: Diagnosis not present

## 2018-05-26 DIAGNOSIS — D631 Anemia in chronic kidney disease: Secondary | ICD-10-CM | POA: Diagnosis not present

## 2018-05-26 DIAGNOSIS — N2581 Secondary hyperparathyroidism of renal origin: Secondary | ICD-10-CM | POA: Diagnosis not present

## 2018-05-27 DIAGNOSIS — D631 Anemia in chronic kidney disease: Secondary | ICD-10-CM | POA: Diagnosis not present

## 2018-05-27 DIAGNOSIS — N2581 Secondary hyperparathyroidism of renal origin: Secondary | ICD-10-CM | POA: Diagnosis not present

## 2018-05-27 DIAGNOSIS — N186 End stage renal disease: Secondary | ICD-10-CM | POA: Diagnosis not present

## 2018-05-27 DIAGNOSIS — D509 Iron deficiency anemia, unspecified: Secondary | ICD-10-CM | POA: Diagnosis not present

## 2018-05-28 DIAGNOSIS — D509 Iron deficiency anemia, unspecified: Secondary | ICD-10-CM | POA: Diagnosis not present

## 2018-05-28 DIAGNOSIS — N186 End stage renal disease: Secondary | ICD-10-CM | POA: Diagnosis not present

## 2018-05-28 DIAGNOSIS — N2581 Secondary hyperparathyroidism of renal origin: Secondary | ICD-10-CM | POA: Diagnosis not present

## 2018-05-28 DIAGNOSIS — D631 Anemia in chronic kidney disease: Secondary | ICD-10-CM | POA: Diagnosis not present

## 2018-05-29 DIAGNOSIS — D631 Anemia in chronic kidney disease: Secondary | ICD-10-CM | POA: Diagnosis not present

## 2018-05-29 DIAGNOSIS — N186 End stage renal disease: Secondary | ICD-10-CM | POA: Diagnosis not present

## 2018-05-29 DIAGNOSIS — N2581 Secondary hyperparathyroidism of renal origin: Secondary | ICD-10-CM | POA: Diagnosis not present

## 2018-05-29 DIAGNOSIS — D509 Iron deficiency anemia, unspecified: Secondary | ICD-10-CM | POA: Diagnosis not present

## 2018-05-30 DIAGNOSIS — D631 Anemia in chronic kidney disease: Secondary | ICD-10-CM | POA: Diagnosis not present

## 2018-05-30 DIAGNOSIS — D509 Iron deficiency anemia, unspecified: Secondary | ICD-10-CM | POA: Diagnosis not present

## 2018-05-30 DIAGNOSIS — N186 End stage renal disease: Secondary | ICD-10-CM | POA: Diagnosis not present

## 2018-05-30 DIAGNOSIS — N2581 Secondary hyperparathyroidism of renal origin: Secondary | ICD-10-CM | POA: Diagnosis not present

## 2018-05-31 DIAGNOSIS — N186 End stage renal disease: Secondary | ICD-10-CM | POA: Diagnosis not present

## 2018-05-31 DIAGNOSIS — D509 Iron deficiency anemia, unspecified: Secondary | ICD-10-CM | POA: Diagnosis not present

## 2018-05-31 DIAGNOSIS — N2581 Secondary hyperparathyroidism of renal origin: Secondary | ICD-10-CM | POA: Diagnosis not present

## 2018-05-31 DIAGNOSIS — D631 Anemia in chronic kidney disease: Secondary | ICD-10-CM | POA: Diagnosis not present

## 2018-06-01 DIAGNOSIS — D631 Anemia in chronic kidney disease: Secondary | ICD-10-CM | POA: Diagnosis not present

## 2018-06-01 DIAGNOSIS — N186 End stage renal disease: Secondary | ICD-10-CM | POA: Diagnosis not present

## 2018-06-01 DIAGNOSIS — N2581 Secondary hyperparathyroidism of renal origin: Secondary | ICD-10-CM | POA: Diagnosis not present

## 2018-06-01 DIAGNOSIS — D509 Iron deficiency anemia, unspecified: Secondary | ICD-10-CM | POA: Diagnosis not present

## 2018-06-02 DIAGNOSIS — D509 Iron deficiency anemia, unspecified: Secondary | ICD-10-CM | POA: Diagnosis not present

## 2018-06-02 DIAGNOSIS — D631 Anemia in chronic kidney disease: Secondary | ICD-10-CM | POA: Diagnosis not present

## 2018-06-02 DIAGNOSIS — N2581 Secondary hyperparathyroidism of renal origin: Secondary | ICD-10-CM | POA: Diagnosis not present

## 2018-06-02 DIAGNOSIS — N186 End stage renal disease: Secondary | ICD-10-CM | POA: Diagnosis not present

## 2018-06-03 DIAGNOSIS — D509 Iron deficiency anemia, unspecified: Secondary | ICD-10-CM | POA: Diagnosis not present

## 2018-06-03 DIAGNOSIS — N2581 Secondary hyperparathyroidism of renal origin: Secondary | ICD-10-CM | POA: Diagnosis not present

## 2018-06-03 DIAGNOSIS — D631 Anemia in chronic kidney disease: Secondary | ICD-10-CM | POA: Diagnosis not present

## 2018-06-03 DIAGNOSIS — N186 End stage renal disease: Secondary | ICD-10-CM | POA: Diagnosis not present

## 2018-06-04 DIAGNOSIS — N2581 Secondary hyperparathyroidism of renal origin: Secondary | ICD-10-CM | POA: Diagnosis not present

## 2018-06-04 DIAGNOSIS — D631 Anemia in chronic kidney disease: Secondary | ICD-10-CM | POA: Diagnosis not present

## 2018-06-04 DIAGNOSIS — D509 Iron deficiency anemia, unspecified: Secondary | ICD-10-CM | POA: Diagnosis not present

## 2018-06-04 DIAGNOSIS — N186 End stage renal disease: Secondary | ICD-10-CM | POA: Diagnosis not present

## 2018-06-05 DIAGNOSIS — N2581 Secondary hyperparathyroidism of renal origin: Secondary | ICD-10-CM | POA: Diagnosis not present

## 2018-06-05 DIAGNOSIS — D509 Iron deficiency anemia, unspecified: Secondary | ICD-10-CM | POA: Diagnosis not present

## 2018-06-05 DIAGNOSIS — D631 Anemia in chronic kidney disease: Secondary | ICD-10-CM | POA: Diagnosis not present

## 2018-06-05 DIAGNOSIS — N186 End stage renal disease: Secondary | ICD-10-CM | POA: Diagnosis not present

## 2018-06-06 DIAGNOSIS — N186 End stage renal disease: Secondary | ICD-10-CM | POA: Diagnosis not present

## 2018-06-06 DIAGNOSIS — D509 Iron deficiency anemia, unspecified: Secondary | ICD-10-CM | POA: Diagnosis not present

## 2018-06-06 DIAGNOSIS — D631 Anemia in chronic kidney disease: Secondary | ICD-10-CM | POA: Diagnosis not present

## 2018-06-06 DIAGNOSIS — N2581 Secondary hyperparathyroidism of renal origin: Secondary | ICD-10-CM | POA: Diagnosis not present

## 2018-06-07 DIAGNOSIS — D509 Iron deficiency anemia, unspecified: Secondary | ICD-10-CM | POA: Diagnosis not present

## 2018-06-07 DIAGNOSIS — D631 Anemia in chronic kidney disease: Secondary | ICD-10-CM | POA: Diagnosis not present

## 2018-06-07 DIAGNOSIS — N2581 Secondary hyperparathyroidism of renal origin: Secondary | ICD-10-CM | POA: Diagnosis not present

## 2018-06-07 DIAGNOSIS — N186 End stage renal disease: Secondary | ICD-10-CM | POA: Diagnosis not present

## 2018-06-08 DIAGNOSIS — N186 End stage renal disease: Secondary | ICD-10-CM | POA: Diagnosis not present

## 2018-06-08 DIAGNOSIS — D509 Iron deficiency anemia, unspecified: Secondary | ICD-10-CM | POA: Diagnosis not present

## 2018-06-08 DIAGNOSIS — D631 Anemia in chronic kidney disease: Secondary | ICD-10-CM | POA: Diagnosis not present

## 2018-06-08 DIAGNOSIS — N2581 Secondary hyperparathyroidism of renal origin: Secondary | ICD-10-CM | POA: Diagnosis not present

## 2018-06-09 DIAGNOSIS — D631 Anemia in chronic kidney disease: Secondary | ICD-10-CM | POA: Diagnosis not present

## 2018-06-09 DIAGNOSIS — N2581 Secondary hyperparathyroidism of renal origin: Secondary | ICD-10-CM | POA: Diagnosis not present

## 2018-06-09 DIAGNOSIS — D509 Iron deficiency anemia, unspecified: Secondary | ICD-10-CM | POA: Diagnosis not present

## 2018-06-09 DIAGNOSIS — N186 End stage renal disease: Secondary | ICD-10-CM | POA: Diagnosis not present

## 2018-06-10 DIAGNOSIS — N186 End stage renal disease: Secondary | ICD-10-CM | POA: Diagnosis not present

## 2018-06-10 DIAGNOSIS — D509 Iron deficiency anemia, unspecified: Secondary | ICD-10-CM | POA: Diagnosis not present

## 2018-06-10 DIAGNOSIS — N2581 Secondary hyperparathyroidism of renal origin: Secondary | ICD-10-CM | POA: Diagnosis not present

## 2018-06-10 DIAGNOSIS — D631 Anemia in chronic kidney disease: Secondary | ICD-10-CM | POA: Diagnosis not present

## 2018-06-11 DIAGNOSIS — D509 Iron deficiency anemia, unspecified: Secondary | ICD-10-CM | POA: Diagnosis not present

## 2018-06-11 DIAGNOSIS — E118 Type 2 diabetes mellitus with unspecified complications: Secondary | ICD-10-CM | POA: Diagnosis not present

## 2018-06-11 DIAGNOSIS — Z4932 Encounter for adequacy testing for peritoneal dialysis: Secondary | ICD-10-CM | POA: Diagnosis not present

## 2018-06-11 DIAGNOSIS — N186 End stage renal disease: Secondary | ICD-10-CM | POA: Diagnosis not present

## 2018-06-11 DIAGNOSIS — N2581 Secondary hyperparathyroidism of renal origin: Secondary | ICD-10-CM | POA: Diagnosis not present

## 2018-06-11 DIAGNOSIS — D631 Anemia in chronic kidney disease: Secondary | ICD-10-CM | POA: Diagnosis not present

## 2018-06-12 DIAGNOSIS — D631 Anemia in chronic kidney disease: Secondary | ICD-10-CM | POA: Diagnosis not present

## 2018-06-12 DIAGNOSIS — N2581 Secondary hyperparathyroidism of renal origin: Secondary | ICD-10-CM | POA: Diagnosis not present

## 2018-06-12 DIAGNOSIS — D509 Iron deficiency anemia, unspecified: Secondary | ICD-10-CM | POA: Diagnosis not present

## 2018-06-12 DIAGNOSIS — N186 End stage renal disease: Secondary | ICD-10-CM | POA: Diagnosis not present

## 2018-06-13 DIAGNOSIS — N186 End stage renal disease: Secondary | ICD-10-CM | POA: Diagnosis not present

## 2018-06-13 DIAGNOSIS — N2581 Secondary hyperparathyroidism of renal origin: Secondary | ICD-10-CM | POA: Diagnosis not present

## 2018-06-13 DIAGNOSIS — D509 Iron deficiency anemia, unspecified: Secondary | ICD-10-CM | POA: Diagnosis not present

## 2018-06-13 DIAGNOSIS — D631 Anemia in chronic kidney disease: Secondary | ICD-10-CM | POA: Diagnosis not present

## 2018-06-14 DIAGNOSIS — D631 Anemia in chronic kidney disease: Secondary | ICD-10-CM | POA: Diagnosis not present

## 2018-06-14 DIAGNOSIS — N2581 Secondary hyperparathyroidism of renal origin: Secondary | ICD-10-CM | POA: Diagnosis not present

## 2018-06-14 DIAGNOSIS — D509 Iron deficiency anemia, unspecified: Secondary | ICD-10-CM | POA: Diagnosis not present

## 2018-06-14 DIAGNOSIS — N186 End stage renal disease: Secondary | ICD-10-CM | POA: Diagnosis not present

## 2018-06-15 DIAGNOSIS — N186 End stage renal disease: Secondary | ICD-10-CM | POA: Diagnosis not present

## 2018-06-15 DIAGNOSIS — D509 Iron deficiency anemia, unspecified: Secondary | ICD-10-CM | POA: Diagnosis not present

## 2018-06-15 DIAGNOSIS — N2581 Secondary hyperparathyroidism of renal origin: Secondary | ICD-10-CM | POA: Diagnosis not present

## 2018-06-15 DIAGNOSIS — D631 Anemia in chronic kidney disease: Secondary | ICD-10-CM | POA: Diagnosis not present

## 2018-06-16 DIAGNOSIS — D509 Iron deficiency anemia, unspecified: Secondary | ICD-10-CM | POA: Diagnosis not present

## 2018-06-16 DIAGNOSIS — D631 Anemia in chronic kidney disease: Secondary | ICD-10-CM | POA: Diagnosis not present

## 2018-06-16 DIAGNOSIS — N2581 Secondary hyperparathyroidism of renal origin: Secondary | ICD-10-CM | POA: Diagnosis not present

## 2018-06-16 DIAGNOSIS — N186 End stage renal disease: Secondary | ICD-10-CM | POA: Diagnosis not present

## 2018-06-17 DIAGNOSIS — N2581 Secondary hyperparathyroidism of renal origin: Secondary | ICD-10-CM | POA: Diagnosis not present

## 2018-06-17 DIAGNOSIS — D509 Iron deficiency anemia, unspecified: Secondary | ICD-10-CM | POA: Diagnosis not present

## 2018-06-17 DIAGNOSIS — D631 Anemia in chronic kidney disease: Secondary | ICD-10-CM | POA: Diagnosis not present

## 2018-06-17 DIAGNOSIS — N186 End stage renal disease: Secondary | ICD-10-CM | POA: Diagnosis not present

## 2018-06-18 DIAGNOSIS — N186 End stage renal disease: Secondary | ICD-10-CM | POA: Diagnosis not present

## 2018-06-18 DIAGNOSIS — D509 Iron deficiency anemia, unspecified: Secondary | ICD-10-CM | POA: Diagnosis not present

## 2018-06-18 DIAGNOSIS — D631 Anemia in chronic kidney disease: Secondary | ICD-10-CM | POA: Diagnosis not present

## 2018-06-18 DIAGNOSIS — N2581 Secondary hyperparathyroidism of renal origin: Secondary | ICD-10-CM | POA: Diagnosis not present

## 2018-06-19 DIAGNOSIS — D631 Anemia in chronic kidney disease: Secondary | ICD-10-CM | POA: Diagnosis not present

## 2018-06-19 DIAGNOSIS — N2581 Secondary hyperparathyroidism of renal origin: Secondary | ICD-10-CM | POA: Diagnosis not present

## 2018-06-19 DIAGNOSIS — N186 End stage renal disease: Secondary | ICD-10-CM | POA: Diagnosis not present

## 2018-06-19 DIAGNOSIS — D509 Iron deficiency anemia, unspecified: Secondary | ICD-10-CM | POA: Diagnosis not present

## 2018-06-20 DIAGNOSIS — D631 Anemia in chronic kidney disease: Secondary | ICD-10-CM | POA: Diagnosis not present

## 2018-06-20 DIAGNOSIS — D509 Iron deficiency anemia, unspecified: Secondary | ICD-10-CM | POA: Diagnosis not present

## 2018-06-20 DIAGNOSIS — N2581 Secondary hyperparathyroidism of renal origin: Secondary | ICD-10-CM | POA: Diagnosis not present

## 2018-06-20 DIAGNOSIS — N186 End stage renal disease: Secondary | ICD-10-CM | POA: Diagnosis not present

## 2018-06-21 DIAGNOSIS — N186 End stage renal disease: Secondary | ICD-10-CM | POA: Diagnosis not present

## 2018-06-21 DIAGNOSIS — N2581 Secondary hyperparathyroidism of renal origin: Secondary | ICD-10-CM | POA: Diagnosis not present

## 2018-06-21 DIAGNOSIS — D631 Anemia in chronic kidney disease: Secondary | ICD-10-CM | POA: Diagnosis not present

## 2018-06-21 DIAGNOSIS — D509 Iron deficiency anemia, unspecified: Secondary | ICD-10-CM | POA: Diagnosis not present

## 2018-06-22 DIAGNOSIS — N2581 Secondary hyperparathyroidism of renal origin: Secondary | ICD-10-CM | POA: Diagnosis not present

## 2018-06-22 DIAGNOSIS — N186 End stage renal disease: Secondary | ICD-10-CM | POA: Diagnosis not present

## 2018-06-22 DIAGNOSIS — D509 Iron deficiency anemia, unspecified: Secondary | ICD-10-CM | POA: Diagnosis not present

## 2018-06-22 DIAGNOSIS — D631 Anemia in chronic kidney disease: Secondary | ICD-10-CM | POA: Diagnosis not present

## 2018-06-23 DIAGNOSIS — N186 End stage renal disease: Secondary | ICD-10-CM | POA: Diagnosis not present

## 2018-06-23 DIAGNOSIS — D631 Anemia in chronic kidney disease: Secondary | ICD-10-CM | POA: Diagnosis not present

## 2018-06-23 DIAGNOSIS — D509 Iron deficiency anemia, unspecified: Secondary | ICD-10-CM | POA: Diagnosis not present

## 2018-06-23 DIAGNOSIS — N2581 Secondary hyperparathyroidism of renal origin: Secondary | ICD-10-CM | POA: Diagnosis not present

## 2018-06-24 DIAGNOSIS — D631 Anemia in chronic kidney disease: Secondary | ICD-10-CM | POA: Diagnosis not present

## 2018-06-24 DIAGNOSIS — N2581 Secondary hyperparathyroidism of renal origin: Secondary | ICD-10-CM | POA: Diagnosis not present

## 2018-06-24 DIAGNOSIS — Z992 Dependence on renal dialysis: Secondary | ICD-10-CM | POA: Diagnosis not present

## 2018-06-24 DIAGNOSIS — D509 Iron deficiency anemia, unspecified: Secondary | ICD-10-CM | POA: Diagnosis not present

## 2018-06-24 DIAGNOSIS — N186 End stage renal disease: Secondary | ICD-10-CM | POA: Diagnosis not present

## 2018-06-25 DIAGNOSIS — D631 Anemia in chronic kidney disease: Secondary | ICD-10-CM | POA: Diagnosis not present

## 2018-06-25 DIAGNOSIS — N186 End stage renal disease: Secondary | ICD-10-CM | POA: Diagnosis not present

## 2018-06-25 DIAGNOSIS — N2581 Secondary hyperparathyroidism of renal origin: Secondary | ICD-10-CM | POA: Diagnosis not present

## 2018-06-26 DIAGNOSIS — N186 End stage renal disease: Secondary | ICD-10-CM | POA: Diagnosis not present

## 2018-06-26 DIAGNOSIS — N2581 Secondary hyperparathyroidism of renal origin: Secondary | ICD-10-CM | POA: Diagnosis not present

## 2018-06-26 DIAGNOSIS — D631 Anemia in chronic kidney disease: Secondary | ICD-10-CM | POA: Diagnosis not present

## 2018-06-27 DIAGNOSIS — N186 End stage renal disease: Secondary | ICD-10-CM | POA: Diagnosis not present

## 2018-06-27 DIAGNOSIS — N2581 Secondary hyperparathyroidism of renal origin: Secondary | ICD-10-CM | POA: Diagnosis not present

## 2018-06-27 DIAGNOSIS — D631 Anemia in chronic kidney disease: Secondary | ICD-10-CM | POA: Diagnosis not present

## 2018-06-28 DIAGNOSIS — N186 End stage renal disease: Secondary | ICD-10-CM | POA: Diagnosis not present

## 2018-06-28 DIAGNOSIS — N2581 Secondary hyperparathyroidism of renal origin: Secondary | ICD-10-CM | POA: Diagnosis not present

## 2018-06-28 DIAGNOSIS — D631 Anemia in chronic kidney disease: Secondary | ICD-10-CM | POA: Diagnosis not present

## 2018-06-29 DIAGNOSIS — D631 Anemia in chronic kidney disease: Secondary | ICD-10-CM | POA: Diagnosis not present

## 2018-06-29 DIAGNOSIS — N186 End stage renal disease: Secondary | ICD-10-CM | POA: Diagnosis not present

## 2018-06-29 DIAGNOSIS — N2581 Secondary hyperparathyroidism of renal origin: Secondary | ICD-10-CM | POA: Diagnosis not present

## 2018-06-30 DIAGNOSIS — D631 Anemia in chronic kidney disease: Secondary | ICD-10-CM | POA: Diagnosis not present

## 2018-06-30 DIAGNOSIS — N2581 Secondary hyperparathyroidism of renal origin: Secondary | ICD-10-CM | POA: Diagnosis not present

## 2018-06-30 DIAGNOSIS — N186 End stage renal disease: Secondary | ICD-10-CM | POA: Diagnosis not present

## 2018-07-01 DIAGNOSIS — D631 Anemia in chronic kidney disease: Secondary | ICD-10-CM | POA: Diagnosis not present

## 2018-07-01 DIAGNOSIS — N2581 Secondary hyperparathyroidism of renal origin: Secondary | ICD-10-CM | POA: Diagnosis not present

## 2018-07-01 DIAGNOSIS — N186 End stage renal disease: Secondary | ICD-10-CM | POA: Diagnosis not present

## 2018-07-02 DIAGNOSIS — N2581 Secondary hyperparathyroidism of renal origin: Secondary | ICD-10-CM | POA: Diagnosis not present

## 2018-07-02 DIAGNOSIS — D631 Anemia in chronic kidney disease: Secondary | ICD-10-CM | POA: Diagnosis not present

## 2018-07-02 DIAGNOSIS — N186 End stage renal disease: Secondary | ICD-10-CM | POA: Diagnosis not present

## 2018-07-03 DIAGNOSIS — N186 End stage renal disease: Secondary | ICD-10-CM | POA: Diagnosis not present

## 2018-07-03 DIAGNOSIS — N2581 Secondary hyperparathyroidism of renal origin: Secondary | ICD-10-CM | POA: Diagnosis not present

## 2018-07-03 DIAGNOSIS — D631 Anemia in chronic kidney disease: Secondary | ICD-10-CM | POA: Diagnosis not present

## 2018-07-04 DIAGNOSIS — N186 End stage renal disease: Secondary | ICD-10-CM | POA: Diagnosis not present

## 2018-07-04 DIAGNOSIS — D631 Anemia in chronic kidney disease: Secondary | ICD-10-CM | POA: Diagnosis not present

## 2018-07-04 DIAGNOSIS — N2581 Secondary hyperparathyroidism of renal origin: Secondary | ICD-10-CM | POA: Diagnosis not present

## 2018-07-05 DIAGNOSIS — N186 End stage renal disease: Secondary | ICD-10-CM | POA: Diagnosis not present

## 2018-07-05 DIAGNOSIS — D631 Anemia in chronic kidney disease: Secondary | ICD-10-CM | POA: Diagnosis not present

## 2018-07-05 DIAGNOSIS — N2581 Secondary hyperparathyroidism of renal origin: Secondary | ICD-10-CM | POA: Diagnosis not present

## 2018-07-06 DIAGNOSIS — N2581 Secondary hyperparathyroidism of renal origin: Secondary | ICD-10-CM | POA: Diagnosis not present

## 2018-07-06 DIAGNOSIS — D631 Anemia in chronic kidney disease: Secondary | ICD-10-CM | POA: Diagnosis not present

## 2018-07-06 DIAGNOSIS — N186 End stage renal disease: Secondary | ICD-10-CM | POA: Diagnosis not present

## 2018-07-07 DIAGNOSIS — D631 Anemia in chronic kidney disease: Secondary | ICD-10-CM | POA: Diagnosis not present

## 2018-07-07 DIAGNOSIS — N186 End stage renal disease: Secondary | ICD-10-CM | POA: Diagnosis not present

## 2018-07-07 DIAGNOSIS — N2581 Secondary hyperparathyroidism of renal origin: Secondary | ICD-10-CM | POA: Diagnosis not present

## 2018-07-08 DIAGNOSIS — N2581 Secondary hyperparathyroidism of renal origin: Secondary | ICD-10-CM | POA: Diagnosis not present

## 2018-07-08 DIAGNOSIS — D631 Anemia in chronic kidney disease: Secondary | ICD-10-CM | POA: Diagnosis not present

## 2018-07-08 DIAGNOSIS — N186 End stage renal disease: Secondary | ICD-10-CM | POA: Diagnosis not present

## 2018-07-09 DIAGNOSIS — N186 End stage renal disease: Secondary | ICD-10-CM | POA: Diagnosis not present

## 2018-07-09 DIAGNOSIS — D631 Anemia in chronic kidney disease: Secondary | ICD-10-CM | POA: Diagnosis not present

## 2018-07-09 DIAGNOSIS — N2581 Secondary hyperparathyroidism of renal origin: Secondary | ICD-10-CM | POA: Diagnosis not present

## 2018-07-10 DIAGNOSIS — D631 Anemia in chronic kidney disease: Secondary | ICD-10-CM | POA: Diagnosis not present

## 2018-07-10 DIAGNOSIS — N2581 Secondary hyperparathyroidism of renal origin: Secondary | ICD-10-CM | POA: Diagnosis not present

## 2018-07-10 DIAGNOSIS — N186 End stage renal disease: Secondary | ICD-10-CM | POA: Diagnosis not present

## 2018-07-11 DIAGNOSIS — N186 End stage renal disease: Secondary | ICD-10-CM | POA: Diagnosis not present

## 2018-07-11 DIAGNOSIS — D631 Anemia in chronic kidney disease: Secondary | ICD-10-CM | POA: Diagnosis not present

## 2018-07-11 DIAGNOSIS — N2581 Secondary hyperparathyroidism of renal origin: Secondary | ICD-10-CM | POA: Diagnosis not present

## 2018-07-12 DIAGNOSIS — D631 Anemia in chronic kidney disease: Secondary | ICD-10-CM | POA: Diagnosis not present

## 2018-07-12 DIAGNOSIS — N186 End stage renal disease: Secondary | ICD-10-CM | POA: Diagnosis not present

## 2018-07-12 DIAGNOSIS — N2581 Secondary hyperparathyroidism of renal origin: Secondary | ICD-10-CM | POA: Diagnosis not present

## 2018-07-12 DIAGNOSIS — E118 Type 2 diabetes mellitus with unspecified complications: Secondary | ICD-10-CM | POA: Diagnosis not present

## 2018-07-13 DIAGNOSIS — D631 Anemia in chronic kidney disease: Secondary | ICD-10-CM | POA: Diagnosis not present

## 2018-07-13 DIAGNOSIS — N186 End stage renal disease: Secondary | ICD-10-CM | POA: Diagnosis not present

## 2018-07-13 DIAGNOSIS — N2581 Secondary hyperparathyroidism of renal origin: Secondary | ICD-10-CM | POA: Diagnosis not present

## 2018-07-14 DIAGNOSIS — D631 Anemia in chronic kidney disease: Secondary | ICD-10-CM | POA: Diagnosis not present

## 2018-07-14 DIAGNOSIS — N2581 Secondary hyperparathyroidism of renal origin: Secondary | ICD-10-CM | POA: Diagnosis not present

## 2018-07-14 DIAGNOSIS — N186 End stage renal disease: Secondary | ICD-10-CM | POA: Diagnosis not present

## 2018-07-15 DIAGNOSIS — D631 Anemia in chronic kidney disease: Secondary | ICD-10-CM | POA: Diagnosis not present

## 2018-07-15 DIAGNOSIS — N186 End stage renal disease: Secondary | ICD-10-CM | POA: Diagnosis not present

## 2018-07-15 DIAGNOSIS — N2581 Secondary hyperparathyroidism of renal origin: Secondary | ICD-10-CM | POA: Diagnosis not present

## 2018-07-16 DIAGNOSIS — N2581 Secondary hyperparathyroidism of renal origin: Secondary | ICD-10-CM | POA: Diagnosis not present

## 2018-07-16 DIAGNOSIS — D631 Anemia in chronic kidney disease: Secondary | ICD-10-CM | POA: Diagnosis not present

## 2018-07-16 DIAGNOSIS — N186 End stage renal disease: Secondary | ICD-10-CM | POA: Diagnosis not present

## 2018-07-17 DIAGNOSIS — N2581 Secondary hyperparathyroidism of renal origin: Secondary | ICD-10-CM | POA: Diagnosis not present

## 2018-07-17 DIAGNOSIS — D631 Anemia in chronic kidney disease: Secondary | ICD-10-CM | POA: Diagnosis not present

## 2018-07-17 DIAGNOSIS — N186 End stage renal disease: Secondary | ICD-10-CM | POA: Diagnosis not present

## 2018-07-18 DIAGNOSIS — N2581 Secondary hyperparathyroidism of renal origin: Secondary | ICD-10-CM | POA: Diagnosis not present

## 2018-07-18 DIAGNOSIS — N186 End stage renal disease: Secondary | ICD-10-CM | POA: Diagnosis not present

## 2018-07-18 DIAGNOSIS — D631 Anemia in chronic kidney disease: Secondary | ICD-10-CM | POA: Diagnosis not present

## 2018-07-19 DIAGNOSIS — N186 End stage renal disease: Secondary | ICD-10-CM | POA: Diagnosis not present

## 2018-07-19 DIAGNOSIS — N2581 Secondary hyperparathyroidism of renal origin: Secondary | ICD-10-CM | POA: Diagnosis not present

## 2018-07-19 DIAGNOSIS — D631 Anemia in chronic kidney disease: Secondary | ICD-10-CM | POA: Diagnosis not present

## 2018-07-20 DIAGNOSIS — D631 Anemia in chronic kidney disease: Secondary | ICD-10-CM | POA: Diagnosis not present

## 2018-07-20 DIAGNOSIS — N2581 Secondary hyperparathyroidism of renal origin: Secondary | ICD-10-CM | POA: Diagnosis not present

## 2018-07-20 DIAGNOSIS — N186 End stage renal disease: Secondary | ICD-10-CM | POA: Diagnosis not present

## 2018-07-21 DIAGNOSIS — N186 End stage renal disease: Secondary | ICD-10-CM | POA: Diagnosis not present

## 2018-07-21 DIAGNOSIS — D631 Anemia in chronic kidney disease: Secondary | ICD-10-CM | POA: Diagnosis not present

## 2018-07-21 DIAGNOSIS — N2581 Secondary hyperparathyroidism of renal origin: Secondary | ICD-10-CM | POA: Diagnosis not present

## 2018-07-22 DIAGNOSIS — D631 Anemia in chronic kidney disease: Secondary | ICD-10-CM | POA: Diagnosis not present

## 2018-07-22 DIAGNOSIS — N2581 Secondary hyperparathyroidism of renal origin: Secondary | ICD-10-CM | POA: Diagnosis not present

## 2018-07-22 DIAGNOSIS — N186 End stage renal disease: Secondary | ICD-10-CM | POA: Diagnosis not present

## 2018-07-23 DIAGNOSIS — N2581 Secondary hyperparathyroidism of renal origin: Secondary | ICD-10-CM | POA: Diagnosis not present

## 2018-07-23 DIAGNOSIS — D631 Anemia in chronic kidney disease: Secondary | ICD-10-CM | POA: Diagnosis not present

## 2018-07-23 DIAGNOSIS — N186 End stage renal disease: Secondary | ICD-10-CM | POA: Diagnosis not present

## 2018-07-24 DIAGNOSIS — N186 End stage renal disease: Secondary | ICD-10-CM | POA: Diagnosis not present

## 2018-07-24 DIAGNOSIS — N2581 Secondary hyperparathyroidism of renal origin: Secondary | ICD-10-CM | POA: Diagnosis not present

## 2018-07-24 DIAGNOSIS — D631 Anemia in chronic kidney disease: Secondary | ICD-10-CM | POA: Diagnosis not present

## 2018-07-25 DIAGNOSIS — Z992 Dependence on renal dialysis: Secondary | ICD-10-CM | POA: Diagnosis not present

## 2018-07-25 DIAGNOSIS — N186 End stage renal disease: Secondary | ICD-10-CM | POA: Diagnosis not present

## 2018-07-25 DIAGNOSIS — N2581 Secondary hyperparathyroidism of renal origin: Secondary | ICD-10-CM | POA: Diagnosis not present

## 2018-07-25 DIAGNOSIS — D631 Anemia in chronic kidney disease: Secondary | ICD-10-CM | POA: Diagnosis not present

## 2018-07-26 DIAGNOSIS — D631 Anemia in chronic kidney disease: Secondary | ICD-10-CM | POA: Diagnosis not present

## 2018-07-26 DIAGNOSIS — D509 Iron deficiency anemia, unspecified: Secondary | ICD-10-CM | POA: Diagnosis not present

## 2018-07-26 DIAGNOSIS — N186 End stage renal disease: Secondary | ICD-10-CM | POA: Diagnosis not present

## 2018-07-26 DIAGNOSIS — N2581 Secondary hyperparathyroidism of renal origin: Secondary | ICD-10-CM | POA: Diagnosis not present

## 2018-07-27 DIAGNOSIS — N2581 Secondary hyperparathyroidism of renal origin: Secondary | ICD-10-CM | POA: Diagnosis not present

## 2018-07-27 DIAGNOSIS — D631 Anemia in chronic kidney disease: Secondary | ICD-10-CM | POA: Diagnosis not present

## 2018-07-27 DIAGNOSIS — N186 End stage renal disease: Secondary | ICD-10-CM | POA: Diagnosis not present

## 2018-07-27 DIAGNOSIS — D509 Iron deficiency anemia, unspecified: Secondary | ICD-10-CM | POA: Diagnosis not present

## 2018-07-28 DIAGNOSIS — N186 End stage renal disease: Secondary | ICD-10-CM | POA: Diagnosis not present

## 2018-07-28 DIAGNOSIS — D509 Iron deficiency anemia, unspecified: Secondary | ICD-10-CM | POA: Diagnosis not present

## 2018-07-28 DIAGNOSIS — D631 Anemia in chronic kidney disease: Secondary | ICD-10-CM | POA: Diagnosis not present

## 2018-07-28 DIAGNOSIS — N2581 Secondary hyperparathyroidism of renal origin: Secondary | ICD-10-CM | POA: Diagnosis not present

## 2018-07-29 DIAGNOSIS — D509 Iron deficiency anemia, unspecified: Secondary | ICD-10-CM | POA: Diagnosis not present

## 2018-07-29 DIAGNOSIS — N2581 Secondary hyperparathyroidism of renal origin: Secondary | ICD-10-CM | POA: Diagnosis not present

## 2018-07-29 DIAGNOSIS — D631 Anemia in chronic kidney disease: Secondary | ICD-10-CM | POA: Diagnosis not present

## 2018-07-29 DIAGNOSIS — N186 End stage renal disease: Secondary | ICD-10-CM | POA: Diagnosis not present

## 2018-07-30 DIAGNOSIS — N186 End stage renal disease: Secondary | ICD-10-CM | POA: Diagnosis not present

## 2018-07-30 DIAGNOSIS — N2581 Secondary hyperparathyroidism of renal origin: Secondary | ICD-10-CM | POA: Diagnosis not present

## 2018-07-30 DIAGNOSIS — D509 Iron deficiency anemia, unspecified: Secondary | ICD-10-CM | POA: Diagnosis not present

## 2018-07-30 DIAGNOSIS — D631 Anemia in chronic kidney disease: Secondary | ICD-10-CM | POA: Diagnosis not present

## 2018-07-31 DIAGNOSIS — N2581 Secondary hyperparathyroidism of renal origin: Secondary | ICD-10-CM | POA: Diagnosis not present

## 2018-07-31 DIAGNOSIS — D509 Iron deficiency anemia, unspecified: Secondary | ICD-10-CM | POA: Diagnosis not present

## 2018-07-31 DIAGNOSIS — D631 Anemia in chronic kidney disease: Secondary | ICD-10-CM | POA: Diagnosis not present

## 2018-07-31 DIAGNOSIS — N186 End stage renal disease: Secondary | ICD-10-CM | POA: Diagnosis not present

## 2018-08-01 DIAGNOSIS — N2581 Secondary hyperparathyroidism of renal origin: Secondary | ICD-10-CM | POA: Diagnosis not present

## 2018-08-01 DIAGNOSIS — D631 Anemia in chronic kidney disease: Secondary | ICD-10-CM | POA: Diagnosis not present

## 2018-08-01 DIAGNOSIS — N186 End stage renal disease: Secondary | ICD-10-CM | POA: Diagnosis not present

## 2018-08-01 DIAGNOSIS — D509 Iron deficiency anemia, unspecified: Secondary | ICD-10-CM | POA: Diagnosis not present

## 2018-08-02 DIAGNOSIS — D631 Anemia in chronic kidney disease: Secondary | ICD-10-CM | POA: Diagnosis not present

## 2018-08-02 DIAGNOSIS — N2581 Secondary hyperparathyroidism of renal origin: Secondary | ICD-10-CM | POA: Diagnosis not present

## 2018-08-02 DIAGNOSIS — N186 End stage renal disease: Secondary | ICD-10-CM | POA: Diagnosis not present

## 2018-08-02 DIAGNOSIS — D509 Iron deficiency anemia, unspecified: Secondary | ICD-10-CM | POA: Diagnosis not present

## 2018-08-03 DIAGNOSIS — N186 End stage renal disease: Secondary | ICD-10-CM | POA: Diagnosis not present

## 2018-08-03 DIAGNOSIS — N2581 Secondary hyperparathyroidism of renal origin: Secondary | ICD-10-CM | POA: Diagnosis not present

## 2018-08-03 DIAGNOSIS — D631 Anemia in chronic kidney disease: Secondary | ICD-10-CM | POA: Diagnosis not present

## 2018-08-03 DIAGNOSIS — D509 Iron deficiency anemia, unspecified: Secondary | ICD-10-CM | POA: Diagnosis not present

## 2018-08-04 DIAGNOSIS — D631 Anemia in chronic kidney disease: Secondary | ICD-10-CM | POA: Diagnosis not present

## 2018-08-04 DIAGNOSIS — E118 Type 2 diabetes mellitus with unspecified complications: Secondary | ICD-10-CM | POA: Diagnosis not present

## 2018-08-04 DIAGNOSIS — N2581 Secondary hyperparathyroidism of renal origin: Secondary | ICD-10-CM | POA: Diagnosis not present

## 2018-08-04 DIAGNOSIS — D509 Iron deficiency anemia, unspecified: Secondary | ICD-10-CM | POA: Diagnosis not present

## 2018-08-04 DIAGNOSIS — N186 End stage renal disease: Secondary | ICD-10-CM | POA: Diagnosis not present

## 2018-08-05 DIAGNOSIS — D631 Anemia in chronic kidney disease: Secondary | ICD-10-CM | POA: Diagnosis not present

## 2018-08-05 DIAGNOSIS — N186 End stage renal disease: Secondary | ICD-10-CM | POA: Diagnosis not present

## 2018-08-05 DIAGNOSIS — N2581 Secondary hyperparathyroidism of renal origin: Secondary | ICD-10-CM | POA: Diagnosis not present

## 2018-08-05 DIAGNOSIS — D509 Iron deficiency anemia, unspecified: Secondary | ICD-10-CM | POA: Diagnosis not present

## 2018-08-06 DIAGNOSIS — N186 End stage renal disease: Secondary | ICD-10-CM | POA: Diagnosis not present

## 2018-08-06 DIAGNOSIS — D631 Anemia in chronic kidney disease: Secondary | ICD-10-CM | POA: Diagnosis not present

## 2018-08-06 DIAGNOSIS — N2581 Secondary hyperparathyroidism of renal origin: Secondary | ICD-10-CM | POA: Diagnosis not present

## 2018-08-06 DIAGNOSIS — D509 Iron deficiency anemia, unspecified: Secondary | ICD-10-CM | POA: Diagnosis not present

## 2018-08-07 DIAGNOSIS — N186 End stage renal disease: Secondary | ICD-10-CM | POA: Diagnosis not present

## 2018-08-07 DIAGNOSIS — N2581 Secondary hyperparathyroidism of renal origin: Secondary | ICD-10-CM | POA: Diagnosis not present

## 2018-08-07 DIAGNOSIS — D509 Iron deficiency anemia, unspecified: Secondary | ICD-10-CM | POA: Diagnosis not present

## 2018-08-07 DIAGNOSIS — D631 Anemia in chronic kidney disease: Secondary | ICD-10-CM | POA: Diagnosis not present

## 2018-08-08 DIAGNOSIS — D631 Anemia in chronic kidney disease: Secondary | ICD-10-CM | POA: Diagnosis not present

## 2018-08-08 DIAGNOSIS — N2581 Secondary hyperparathyroidism of renal origin: Secondary | ICD-10-CM | POA: Diagnosis not present

## 2018-08-08 DIAGNOSIS — D509 Iron deficiency anemia, unspecified: Secondary | ICD-10-CM | POA: Diagnosis not present

## 2018-08-08 DIAGNOSIS — N186 End stage renal disease: Secondary | ICD-10-CM | POA: Diagnosis not present

## 2018-08-09 DIAGNOSIS — D631 Anemia in chronic kidney disease: Secondary | ICD-10-CM | POA: Diagnosis not present

## 2018-08-09 DIAGNOSIS — N186 End stage renal disease: Secondary | ICD-10-CM | POA: Diagnosis not present

## 2018-08-09 DIAGNOSIS — N2581 Secondary hyperparathyroidism of renal origin: Secondary | ICD-10-CM | POA: Diagnosis not present

## 2018-08-09 DIAGNOSIS — D509 Iron deficiency anemia, unspecified: Secondary | ICD-10-CM | POA: Diagnosis not present

## 2018-08-10 DIAGNOSIS — D509 Iron deficiency anemia, unspecified: Secondary | ICD-10-CM | POA: Diagnosis not present

## 2018-08-10 DIAGNOSIS — N2581 Secondary hyperparathyroidism of renal origin: Secondary | ICD-10-CM | POA: Diagnosis not present

## 2018-08-10 DIAGNOSIS — N186 End stage renal disease: Secondary | ICD-10-CM | POA: Diagnosis not present

## 2018-08-10 DIAGNOSIS — D631 Anemia in chronic kidney disease: Secondary | ICD-10-CM | POA: Diagnosis not present

## 2018-08-11 DIAGNOSIS — N2581 Secondary hyperparathyroidism of renal origin: Secondary | ICD-10-CM | POA: Diagnosis not present

## 2018-08-11 DIAGNOSIS — D631 Anemia in chronic kidney disease: Secondary | ICD-10-CM | POA: Diagnosis not present

## 2018-08-11 DIAGNOSIS — N186 End stage renal disease: Secondary | ICD-10-CM | POA: Diagnosis not present

## 2018-08-11 DIAGNOSIS — D509 Iron deficiency anemia, unspecified: Secondary | ICD-10-CM | POA: Diagnosis not present

## 2018-08-12 DIAGNOSIS — D509 Iron deficiency anemia, unspecified: Secondary | ICD-10-CM | POA: Diagnosis not present

## 2018-08-12 DIAGNOSIS — N186 End stage renal disease: Secondary | ICD-10-CM | POA: Diagnosis not present

## 2018-08-12 DIAGNOSIS — N2581 Secondary hyperparathyroidism of renal origin: Secondary | ICD-10-CM | POA: Diagnosis not present

## 2018-08-12 DIAGNOSIS — D631 Anemia in chronic kidney disease: Secondary | ICD-10-CM | POA: Diagnosis not present

## 2018-08-13 DIAGNOSIS — D509 Iron deficiency anemia, unspecified: Secondary | ICD-10-CM | POA: Diagnosis not present

## 2018-08-13 DIAGNOSIS — N2581 Secondary hyperparathyroidism of renal origin: Secondary | ICD-10-CM | POA: Diagnosis not present

## 2018-08-13 DIAGNOSIS — D631 Anemia in chronic kidney disease: Secondary | ICD-10-CM | POA: Diagnosis not present

## 2018-08-13 DIAGNOSIS — N186 End stage renal disease: Secondary | ICD-10-CM | POA: Diagnosis not present

## 2018-08-14 DIAGNOSIS — N186 End stage renal disease: Secondary | ICD-10-CM | POA: Diagnosis not present

## 2018-08-14 DIAGNOSIS — D509 Iron deficiency anemia, unspecified: Secondary | ICD-10-CM | POA: Diagnosis not present

## 2018-08-14 DIAGNOSIS — D631 Anemia in chronic kidney disease: Secondary | ICD-10-CM | POA: Diagnosis not present

## 2018-08-14 DIAGNOSIS — N2581 Secondary hyperparathyroidism of renal origin: Secondary | ICD-10-CM | POA: Diagnosis not present

## 2018-08-15 DIAGNOSIS — N186 End stage renal disease: Secondary | ICD-10-CM | POA: Diagnosis not present

## 2018-08-15 DIAGNOSIS — D631 Anemia in chronic kidney disease: Secondary | ICD-10-CM | POA: Diagnosis not present

## 2018-08-15 DIAGNOSIS — D509 Iron deficiency anemia, unspecified: Secondary | ICD-10-CM | POA: Diagnosis not present

## 2018-08-15 DIAGNOSIS — N2581 Secondary hyperparathyroidism of renal origin: Secondary | ICD-10-CM | POA: Diagnosis not present

## 2018-08-16 DIAGNOSIS — D509 Iron deficiency anemia, unspecified: Secondary | ICD-10-CM | POA: Diagnosis not present

## 2018-08-16 DIAGNOSIS — N186 End stage renal disease: Secondary | ICD-10-CM | POA: Diagnosis not present

## 2018-08-16 DIAGNOSIS — N2581 Secondary hyperparathyroidism of renal origin: Secondary | ICD-10-CM | POA: Diagnosis not present

## 2018-08-16 DIAGNOSIS — D631 Anemia in chronic kidney disease: Secondary | ICD-10-CM | POA: Diagnosis not present

## 2018-08-17 DIAGNOSIS — N186 End stage renal disease: Secondary | ICD-10-CM | POA: Diagnosis not present

## 2018-08-17 DIAGNOSIS — D509 Iron deficiency anemia, unspecified: Secondary | ICD-10-CM | POA: Diagnosis not present

## 2018-08-17 DIAGNOSIS — D631 Anemia in chronic kidney disease: Secondary | ICD-10-CM | POA: Diagnosis not present

## 2018-08-17 DIAGNOSIS — N2581 Secondary hyperparathyroidism of renal origin: Secondary | ICD-10-CM | POA: Diagnosis not present

## 2018-08-18 DIAGNOSIS — N2581 Secondary hyperparathyroidism of renal origin: Secondary | ICD-10-CM | POA: Diagnosis not present

## 2018-08-18 DIAGNOSIS — D631 Anemia in chronic kidney disease: Secondary | ICD-10-CM | POA: Diagnosis not present

## 2018-08-18 DIAGNOSIS — N186 End stage renal disease: Secondary | ICD-10-CM | POA: Diagnosis not present

## 2018-08-18 DIAGNOSIS — D509 Iron deficiency anemia, unspecified: Secondary | ICD-10-CM | POA: Diagnosis not present

## 2018-08-19 DIAGNOSIS — D509 Iron deficiency anemia, unspecified: Secondary | ICD-10-CM | POA: Diagnosis not present

## 2018-08-19 DIAGNOSIS — N186 End stage renal disease: Secondary | ICD-10-CM | POA: Diagnosis not present

## 2018-08-19 DIAGNOSIS — N2581 Secondary hyperparathyroidism of renal origin: Secondary | ICD-10-CM | POA: Diagnosis not present

## 2018-08-19 DIAGNOSIS — D631 Anemia in chronic kidney disease: Secondary | ICD-10-CM | POA: Diagnosis not present

## 2018-08-20 DIAGNOSIS — D631 Anemia in chronic kidney disease: Secondary | ICD-10-CM | POA: Diagnosis not present

## 2018-08-20 DIAGNOSIS — N186 End stage renal disease: Secondary | ICD-10-CM | POA: Diagnosis not present

## 2018-08-20 DIAGNOSIS — D509 Iron deficiency anemia, unspecified: Secondary | ICD-10-CM | POA: Diagnosis not present

## 2018-08-20 DIAGNOSIS — N2581 Secondary hyperparathyroidism of renal origin: Secondary | ICD-10-CM | POA: Diagnosis not present

## 2018-08-21 DIAGNOSIS — N186 End stage renal disease: Secondary | ICD-10-CM | POA: Diagnosis not present

## 2018-08-21 DIAGNOSIS — N2581 Secondary hyperparathyroidism of renal origin: Secondary | ICD-10-CM | POA: Diagnosis not present

## 2018-08-21 DIAGNOSIS — D631 Anemia in chronic kidney disease: Secondary | ICD-10-CM | POA: Diagnosis not present

## 2018-08-21 DIAGNOSIS — D509 Iron deficiency anemia, unspecified: Secondary | ICD-10-CM | POA: Diagnosis not present

## 2018-08-22 DIAGNOSIS — N2581 Secondary hyperparathyroidism of renal origin: Secondary | ICD-10-CM | POA: Diagnosis not present

## 2018-08-22 DIAGNOSIS — D509 Iron deficiency anemia, unspecified: Secondary | ICD-10-CM | POA: Diagnosis not present

## 2018-08-22 DIAGNOSIS — D631 Anemia in chronic kidney disease: Secondary | ICD-10-CM | POA: Diagnosis not present

## 2018-08-22 DIAGNOSIS — N186 End stage renal disease: Secondary | ICD-10-CM | POA: Diagnosis not present

## 2018-08-23 DIAGNOSIS — D631 Anemia in chronic kidney disease: Secondary | ICD-10-CM | POA: Diagnosis not present

## 2018-08-23 DIAGNOSIS — N2581 Secondary hyperparathyroidism of renal origin: Secondary | ICD-10-CM | POA: Diagnosis not present

## 2018-08-23 DIAGNOSIS — D509 Iron deficiency anemia, unspecified: Secondary | ICD-10-CM | POA: Diagnosis not present

## 2018-08-23 DIAGNOSIS — N186 End stage renal disease: Secondary | ICD-10-CM | POA: Diagnosis not present

## 2018-08-23 DIAGNOSIS — I10 Essential (primary) hypertension: Secondary | ICD-10-CM | POA: Diagnosis not present

## 2018-08-23 DIAGNOSIS — N185 Chronic kidney disease, stage 5: Secondary | ICD-10-CM | POA: Diagnosis not present

## 2018-08-24 ENCOUNTER — Other Ambulatory Visit: Payer: Self-pay | Admitting: Sports Medicine

## 2018-08-24 DIAGNOSIS — N2581 Secondary hyperparathyroidism of renal origin: Secondary | ICD-10-CM | POA: Diagnosis not present

## 2018-08-24 DIAGNOSIS — N186 End stage renal disease: Secondary | ICD-10-CM | POA: Diagnosis not present

## 2018-08-24 DIAGNOSIS — D631 Anemia in chronic kidney disease: Secondary | ICD-10-CM | POA: Diagnosis not present

## 2018-08-24 DIAGNOSIS — D509 Iron deficiency anemia, unspecified: Secondary | ICD-10-CM | POA: Diagnosis not present

## 2018-08-24 DIAGNOSIS — Z992 Dependence on renal dialysis: Secondary | ICD-10-CM | POA: Diagnosis not present

## 2018-08-25 DIAGNOSIS — N186 End stage renal disease: Secondary | ICD-10-CM | POA: Diagnosis not present

## 2018-08-25 DIAGNOSIS — D631 Anemia in chronic kidney disease: Secondary | ICD-10-CM | POA: Diagnosis not present

## 2018-08-25 DIAGNOSIS — N2581 Secondary hyperparathyroidism of renal origin: Secondary | ICD-10-CM | POA: Diagnosis not present

## 2018-08-25 DIAGNOSIS — D509 Iron deficiency anemia, unspecified: Secondary | ICD-10-CM | POA: Diagnosis not present

## 2018-08-26 DIAGNOSIS — D509 Iron deficiency anemia, unspecified: Secondary | ICD-10-CM | POA: Diagnosis not present

## 2018-08-26 DIAGNOSIS — N186 End stage renal disease: Secondary | ICD-10-CM | POA: Diagnosis not present

## 2018-08-26 DIAGNOSIS — N2581 Secondary hyperparathyroidism of renal origin: Secondary | ICD-10-CM | POA: Diagnosis not present

## 2018-08-26 DIAGNOSIS — D631 Anemia in chronic kidney disease: Secondary | ICD-10-CM | POA: Diagnosis not present

## 2018-08-27 DIAGNOSIS — D631 Anemia in chronic kidney disease: Secondary | ICD-10-CM | POA: Diagnosis not present

## 2018-08-27 DIAGNOSIS — N2581 Secondary hyperparathyroidism of renal origin: Secondary | ICD-10-CM | POA: Diagnosis not present

## 2018-08-27 DIAGNOSIS — N186 End stage renal disease: Secondary | ICD-10-CM | POA: Diagnosis not present

## 2018-08-27 DIAGNOSIS — D509 Iron deficiency anemia, unspecified: Secondary | ICD-10-CM | POA: Diagnosis not present

## 2018-08-28 DIAGNOSIS — N2581 Secondary hyperparathyroidism of renal origin: Secondary | ICD-10-CM | POA: Diagnosis not present

## 2018-08-28 DIAGNOSIS — N186 End stage renal disease: Secondary | ICD-10-CM | POA: Diagnosis not present

## 2018-08-28 DIAGNOSIS — D509 Iron deficiency anemia, unspecified: Secondary | ICD-10-CM | POA: Diagnosis not present

## 2018-08-28 DIAGNOSIS — D631 Anemia in chronic kidney disease: Secondary | ICD-10-CM | POA: Diagnosis not present

## 2018-08-29 DIAGNOSIS — D509 Iron deficiency anemia, unspecified: Secondary | ICD-10-CM | POA: Diagnosis not present

## 2018-08-29 DIAGNOSIS — N2581 Secondary hyperparathyroidism of renal origin: Secondary | ICD-10-CM | POA: Diagnosis not present

## 2018-08-29 DIAGNOSIS — N186 End stage renal disease: Secondary | ICD-10-CM | POA: Diagnosis not present

## 2018-08-29 DIAGNOSIS — D631 Anemia in chronic kidney disease: Secondary | ICD-10-CM | POA: Diagnosis not present

## 2018-08-30 DIAGNOSIS — D509 Iron deficiency anemia, unspecified: Secondary | ICD-10-CM | POA: Diagnosis not present

## 2018-08-30 DIAGNOSIS — N2581 Secondary hyperparathyroidism of renal origin: Secondary | ICD-10-CM | POA: Diagnosis not present

## 2018-08-30 DIAGNOSIS — N186 End stage renal disease: Secondary | ICD-10-CM | POA: Diagnosis not present

## 2018-08-30 DIAGNOSIS — D631 Anemia in chronic kidney disease: Secondary | ICD-10-CM | POA: Diagnosis not present

## 2018-08-31 DIAGNOSIS — D631 Anemia in chronic kidney disease: Secondary | ICD-10-CM | POA: Diagnosis not present

## 2018-08-31 DIAGNOSIS — N2581 Secondary hyperparathyroidism of renal origin: Secondary | ICD-10-CM | POA: Diagnosis not present

## 2018-08-31 DIAGNOSIS — D509 Iron deficiency anemia, unspecified: Secondary | ICD-10-CM | POA: Diagnosis not present

## 2018-08-31 DIAGNOSIS — N186 End stage renal disease: Secondary | ICD-10-CM | POA: Diagnosis not present

## 2018-09-01 DIAGNOSIS — N2581 Secondary hyperparathyroidism of renal origin: Secondary | ICD-10-CM | POA: Diagnosis not present

## 2018-09-01 DIAGNOSIS — N186 End stage renal disease: Secondary | ICD-10-CM | POA: Diagnosis not present

## 2018-09-01 DIAGNOSIS — D631 Anemia in chronic kidney disease: Secondary | ICD-10-CM | POA: Diagnosis not present

## 2018-09-01 DIAGNOSIS — D509 Iron deficiency anemia, unspecified: Secondary | ICD-10-CM | POA: Diagnosis not present

## 2018-09-02 DIAGNOSIS — N186 End stage renal disease: Secondary | ICD-10-CM | POA: Diagnosis not present

## 2018-09-02 DIAGNOSIS — D509 Iron deficiency anemia, unspecified: Secondary | ICD-10-CM | POA: Diagnosis not present

## 2018-09-02 DIAGNOSIS — D631 Anemia in chronic kidney disease: Secondary | ICD-10-CM | POA: Diagnosis not present

## 2018-09-02 DIAGNOSIS — N2581 Secondary hyperparathyroidism of renal origin: Secondary | ICD-10-CM | POA: Diagnosis not present

## 2018-09-03 DIAGNOSIS — D631 Anemia in chronic kidney disease: Secondary | ICD-10-CM | POA: Diagnosis not present

## 2018-09-03 DIAGNOSIS — N2581 Secondary hyperparathyroidism of renal origin: Secondary | ICD-10-CM | POA: Diagnosis not present

## 2018-09-03 DIAGNOSIS — D509 Iron deficiency anemia, unspecified: Secondary | ICD-10-CM | POA: Diagnosis not present

## 2018-09-03 DIAGNOSIS — N186 End stage renal disease: Secondary | ICD-10-CM | POA: Diagnosis not present

## 2018-09-04 DIAGNOSIS — N2581 Secondary hyperparathyroidism of renal origin: Secondary | ICD-10-CM | POA: Diagnosis not present

## 2018-09-04 DIAGNOSIS — D631 Anemia in chronic kidney disease: Secondary | ICD-10-CM | POA: Diagnosis not present

## 2018-09-04 DIAGNOSIS — N186 End stage renal disease: Secondary | ICD-10-CM | POA: Diagnosis not present

## 2018-09-04 DIAGNOSIS — D509 Iron deficiency anemia, unspecified: Secondary | ICD-10-CM | POA: Diagnosis not present

## 2018-09-05 DIAGNOSIS — N2581 Secondary hyperparathyroidism of renal origin: Secondary | ICD-10-CM | POA: Diagnosis not present

## 2018-09-05 DIAGNOSIS — D631 Anemia in chronic kidney disease: Secondary | ICD-10-CM | POA: Diagnosis not present

## 2018-09-05 DIAGNOSIS — N186 End stage renal disease: Secondary | ICD-10-CM | POA: Diagnosis not present

## 2018-09-05 DIAGNOSIS — D509 Iron deficiency anemia, unspecified: Secondary | ICD-10-CM | POA: Diagnosis not present

## 2018-09-06 DIAGNOSIS — D509 Iron deficiency anemia, unspecified: Secondary | ICD-10-CM | POA: Diagnosis not present

## 2018-09-06 DIAGNOSIS — N186 End stage renal disease: Secondary | ICD-10-CM | POA: Diagnosis not present

## 2018-09-06 DIAGNOSIS — N2581 Secondary hyperparathyroidism of renal origin: Secondary | ICD-10-CM | POA: Diagnosis not present

## 2018-09-06 DIAGNOSIS — D631 Anemia in chronic kidney disease: Secondary | ICD-10-CM | POA: Diagnosis not present

## 2018-09-07 DIAGNOSIS — N186 End stage renal disease: Secondary | ICD-10-CM | POA: Diagnosis not present

## 2018-09-07 DIAGNOSIS — D631 Anemia in chronic kidney disease: Secondary | ICD-10-CM | POA: Diagnosis not present

## 2018-09-07 DIAGNOSIS — D509 Iron deficiency anemia, unspecified: Secondary | ICD-10-CM | POA: Diagnosis not present

## 2018-09-07 DIAGNOSIS — N2581 Secondary hyperparathyroidism of renal origin: Secondary | ICD-10-CM | POA: Diagnosis not present

## 2018-09-08 DIAGNOSIS — D509 Iron deficiency anemia, unspecified: Secondary | ICD-10-CM | POA: Diagnosis not present

## 2018-09-08 DIAGNOSIS — N186 End stage renal disease: Secondary | ICD-10-CM | POA: Diagnosis not present

## 2018-09-08 DIAGNOSIS — N2581 Secondary hyperparathyroidism of renal origin: Secondary | ICD-10-CM | POA: Diagnosis not present

## 2018-09-08 DIAGNOSIS — D631 Anemia in chronic kidney disease: Secondary | ICD-10-CM | POA: Diagnosis not present

## 2018-09-09 DIAGNOSIS — Z4932 Encounter for adequacy testing for peritoneal dialysis: Secondary | ICD-10-CM | POA: Diagnosis not present

## 2018-09-09 DIAGNOSIS — E118 Type 2 diabetes mellitus with unspecified complications: Secondary | ICD-10-CM | POA: Diagnosis not present

## 2018-09-09 DIAGNOSIS — D631 Anemia in chronic kidney disease: Secondary | ICD-10-CM | POA: Diagnosis not present

## 2018-09-09 DIAGNOSIS — N2581 Secondary hyperparathyroidism of renal origin: Secondary | ICD-10-CM | POA: Diagnosis not present

## 2018-09-09 DIAGNOSIS — D509 Iron deficiency anemia, unspecified: Secondary | ICD-10-CM | POA: Diagnosis not present

## 2018-09-09 DIAGNOSIS — N186 End stage renal disease: Secondary | ICD-10-CM | POA: Diagnosis not present

## 2018-09-10 DIAGNOSIS — N2581 Secondary hyperparathyroidism of renal origin: Secondary | ICD-10-CM | POA: Diagnosis not present

## 2018-09-10 DIAGNOSIS — D509 Iron deficiency anemia, unspecified: Secondary | ICD-10-CM | POA: Diagnosis not present

## 2018-09-10 DIAGNOSIS — D631 Anemia in chronic kidney disease: Secondary | ICD-10-CM | POA: Diagnosis not present

## 2018-09-10 DIAGNOSIS — N186 End stage renal disease: Secondary | ICD-10-CM | POA: Diagnosis not present

## 2018-09-11 DIAGNOSIS — N2581 Secondary hyperparathyroidism of renal origin: Secondary | ICD-10-CM | POA: Diagnosis not present

## 2018-09-11 DIAGNOSIS — D509 Iron deficiency anemia, unspecified: Secondary | ICD-10-CM | POA: Diagnosis not present

## 2018-09-11 DIAGNOSIS — D631 Anemia in chronic kidney disease: Secondary | ICD-10-CM | POA: Diagnosis not present

## 2018-09-11 DIAGNOSIS — N186 End stage renal disease: Secondary | ICD-10-CM | POA: Diagnosis not present

## 2018-09-12 DIAGNOSIS — D631 Anemia in chronic kidney disease: Secondary | ICD-10-CM | POA: Diagnosis not present

## 2018-09-12 DIAGNOSIS — N2581 Secondary hyperparathyroidism of renal origin: Secondary | ICD-10-CM | POA: Diagnosis not present

## 2018-09-12 DIAGNOSIS — D509 Iron deficiency anemia, unspecified: Secondary | ICD-10-CM | POA: Diagnosis not present

## 2018-09-12 DIAGNOSIS — N186 End stage renal disease: Secondary | ICD-10-CM | POA: Diagnosis not present

## 2018-09-13 DIAGNOSIS — D631 Anemia in chronic kidney disease: Secondary | ICD-10-CM | POA: Diagnosis not present

## 2018-09-13 DIAGNOSIS — N2581 Secondary hyperparathyroidism of renal origin: Secondary | ICD-10-CM | POA: Diagnosis not present

## 2018-09-13 DIAGNOSIS — D509 Iron deficiency anemia, unspecified: Secondary | ICD-10-CM | POA: Diagnosis not present

## 2018-09-13 DIAGNOSIS — N186 End stage renal disease: Secondary | ICD-10-CM | POA: Diagnosis not present

## 2018-09-14 DIAGNOSIS — D631 Anemia in chronic kidney disease: Secondary | ICD-10-CM | POA: Diagnosis not present

## 2018-09-14 DIAGNOSIS — N2581 Secondary hyperparathyroidism of renal origin: Secondary | ICD-10-CM | POA: Diagnosis not present

## 2018-09-14 DIAGNOSIS — D509 Iron deficiency anemia, unspecified: Secondary | ICD-10-CM | POA: Diagnosis not present

## 2018-09-14 DIAGNOSIS — N186 End stage renal disease: Secondary | ICD-10-CM | POA: Diagnosis not present

## 2018-09-15 DIAGNOSIS — N186 End stage renal disease: Secondary | ICD-10-CM | POA: Diagnosis not present

## 2018-09-15 DIAGNOSIS — N2581 Secondary hyperparathyroidism of renal origin: Secondary | ICD-10-CM | POA: Diagnosis not present

## 2018-09-15 DIAGNOSIS — D631 Anemia in chronic kidney disease: Secondary | ICD-10-CM | POA: Diagnosis not present

## 2018-09-15 DIAGNOSIS — D509 Iron deficiency anemia, unspecified: Secondary | ICD-10-CM | POA: Diagnosis not present

## 2018-09-16 DIAGNOSIS — N2581 Secondary hyperparathyroidism of renal origin: Secondary | ICD-10-CM | POA: Diagnosis not present

## 2018-09-16 DIAGNOSIS — D509 Iron deficiency anemia, unspecified: Secondary | ICD-10-CM | POA: Diagnosis not present

## 2018-09-16 DIAGNOSIS — D631 Anemia in chronic kidney disease: Secondary | ICD-10-CM | POA: Diagnosis not present

## 2018-09-16 DIAGNOSIS — N186 End stage renal disease: Secondary | ICD-10-CM | POA: Diagnosis not present

## 2018-09-17 DIAGNOSIS — D509 Iron deficiency anemia, unspecified: Secondary | ICD-10-CM | POA: Diagnosis not present

## 2018-09-17 DIAGNOSIS — D631 Anemia in chronic kidney disease: Secondary | ICD-10-CM | POA: Diagnosis not present

## 2018-09-17 DIAGNOSIS — N186 End stage renal disease: Secondary | ICD-10-CM | POA: Diagnosis not present

## 2018-09-17 DIAGNOSIS — N2581 Secondary hyperparathyroidism of renal origin: Secondary | ICD-10-CM | POA: Diagnosis not present

## 2018-09-18 DIAGNOSIS — D509 Iron deficiency anemia, unspecified: Secondary | ICD-10-CM | POA: Diagnosis not present

## 2018-09-18 DIAGNOSIS — N2581 Secondary hyperparathyroidism of renal origin: Secondary | ICD-10-CM | POA: Diagnosis not present

## 2018-09-18 DIAGNOSIS — D631 Anemia in chronic kidney disease: Secondary | ICD-10-CM | POA: Diagnosis not present

## 2018-09-18 DIAGNOSIS — N186 End stage renal disease: Secondary | ICD-10-CM | POA: Diagnosis not present

## 2018-09-19 DIAGNOSIS — D509 Iron deficiency anemia, unspecified: Secondary | ICD-10-CM | POA: Diagnosis not present

## 2018-09-19 DIAGNOSIS — D631 Anemia in chronic kidney disease: Secondary | ICD-10-CM | POA: Diagnosis not present

## 2018-09-19 DIAGNOSIS — N2581 Secondary hyperparathyroidism of renal origin: Secondary | ICD-10-CM | POA: Diagnosis not present

## 2018-09-19 DIAGNOSIS — N186 End stage renal disease: Secondary | ICD-10-CM | POA: Diagnosis not present

## 2018-09-20 DIAGNOSIS — N186 End stage renal disease: Secondary | ICD-10-CM | POA: Diagnosis not present

## 2018-09-20 DIAGNOSIS — D631 Anemia in chronic kidney disease: Secondary | ICD-10-CM | POA: Diagnosis not present

## 2018-09-20 DIAGNOSIS — D509 Iron deficiency anemia, unspecified: Secondary | ICD-10-CM | POA: Diagnosis not present

## 2018-09-20 DIAGNOSIS — N2581 Secondary hyperparathyroidism of renal origin: Secondary | ICD-10-CM | POA: Diagnosis not present

## 2018-09-21 DIAGNOSIS — D509 Iron deficiency anemia, unspecified: Secondary | ICD-10-CM | POA: Diagnosis not present

## 2018-09-21 DIAGNOSIS — D631 Anemia in chronic kidney disease: Secondary | ICD-10-CM | POA: Diagnosis not present

## 2018-09-21 DIAGNOSIS — N2581 Secondary hyperparathyroidism of renal origin: Secondary | ICD-10-CM | POA: Diagnosis not present

## 2018-09-21 DIAGNOSIS — N186 End stage renal disease: Secondary | ICD-10-CM | POA: Diagnosis not present

## 2018-09-22 DIAGNOSIS — D509 Iron deficiency anemia, unspecified: Secondary | ICD-10-CM | POA: Diagnosis not present

## 2018-09-22 DIAGNOSIS — D631 Anemia in chronic kidney disease: Secondary | ICD-10-CM | POA: Diagnosis not present

## 2018-09-22 DIAGNOSIS — N186 End stage renal disease: Secondary | ICD-10-CM | POA: Diagnosis not present

## 2018-09-22 DIAGNOSIS — N2581 Secondary hyperparathyroidism of renal origin: Secondary | ICD-10-CM | POA: Diagnosis not present

## 2018-09-23 ENCOUNTER — Other Ambulatory Visit: Payer: Self-pay

## 2018-09-23 DIAGNOSIS — N186 End stage renal disease: Secondary | ICD-10-CM | POA: Diagnosis not present

## 2018-09-23 DIAGNOSIS — D631 Anemia in chronic kidney disease: Secondary | ICD-10-CM | POA: Diagnosis not present

## 2018-09-23 DIAGNOSIS — D509 Iron deficiency anemia, unspecified: Secondary | ICD-10-CM | POA: Diagnosis not present

## 2018-09-23 DIAGNOSIS — N2581 Secondary hyperparathyroidism of renal origin: Secondary | ICD-10-CM | POA: Diagnosis not present

## 2018-09-24 DIAGNOSIS — N2581 Secondary hyperparathyroidism of renal origin: Secondary | ICD-10-CM | POA: Diagnosis not present

## 2018-09-24 DIAGNOSIS — Z992 Dependence on renal dialysis: Secondary | ICD-10-CM | POA: Diagnosis not present

## 2018-09-24 DIAGNOSIS — N186 End stage renal disease: Secondary | ICD-10-CM | POA: Diagnosis not present

## 2018-09-24 DIAGNOSIS — D631 Anemia in chronic kidney disease: Secondary | ICD-10-CM | POA: Diagnosis not present

## 2018-09-24 DIAGNOSIS — D509 Iron deficiency anemia, unspecified: Secondary | ICD-10-CM | POA: Diagnosis not present

## 2018-09-25 DIAGNOSIS — D631 Anemia in chronic kidney disease: Secondary | ICD-10-CM | POA: Diagnosis not present

## 2018-09-25 DIAGNOSIS — D509 Iron deficiency anemia, unspecified: Secondary | ICD-10-CM | POA: Diagnosis not present

## 2018-09-25 DIAGNOSIS — N2581 Secondary hyperparathyroidism of renal origin: Secondary | ICD-10-CM | POA: Diagnosis not present

## 2018-09-25 DIAGNOSIS — N186 End stage renal disease: Secondary | ICD-10-CM | POA: Diagnosis not present

## 2018-09-26 DIAGNOSIS — N186 End stage renal disease: Secondary | ICD-10-CM | POA: Diagnosis not present

## 2018-09-26 DIAGNOSIS — D509 Iron deficiency anemia, unspecified: Secondary | ICD-10-CM | POA: Diagnosis not present

## 2018-09-26 DIAGNOSIS — D631 Anemia in chronic kidney disease: Secondary | ICD-10-CM | POA: Diagnosis not present

## 2018-09-26 DIAGNOSIS — N2581 Secondary hyperparathyroidism of renal origin: Secondary | ICD-10-CM | POA: Diagnosis not present

## 2018-09-27 DIAGNOSIS — D509 Iron deficiency anemia, unspecified: Secondary | ICD-10-CM | POA: Diagnosis not present

## 2018-09-27 DIAGNOSIS — D631 Anemia in chronic kidney disease: Secondary | ICD-10-CM | POA: Diagnosis not present

## 2018-09-27 DIAGNOSIS — N2581 Secondary hyperparathyroidism of renal origin: Secondary | ICD-10-CM | POA: Diagnosis not present

## 2018-09-27 DIAGNOSIS — N186 End stage renal disease: Secondary | ICD-10-CM | POA: Diagnosis not present

## 2018-09-28 DIAGNOSIS — D509 Iron deficiency anemia, unspecified: Secondary | ICD-10-CM | POA: Diagnosis not present

## 2018-09-28 DIAGNOSIS — N2581 Secondary hyperparathyroidism of renal origin: Secondary | ICD-10-CM | POA: Diagnosis not present

## 2018-09-28 DIAGNOSIS — N186 End stage renal disease: Secondary | ICD-10-CM | POA: Diagnosis not present

## 2018-09-28 DIAGNOSIS — D631 Anemia in chronic kidney disease: Secondary | ICD-10-CM | POA: Diagnosis not present

## 2018-09-29 DIAGNOSIS — N2581 Secondary hyperparathyroidism of renal origin: Secondary | ICD-10-CM | POA: Diagnosis not present

## 2018-09-29 DIAGNOSIS — D509 Iron deficiency anemia, unspecified: Secondary | ICD-10-CM | POA: Diagnosis not present

## 2018-09-29 DIAGNOSIS — D631 Anemia in chronic kidney disease: Secondary | ICD-10-CM | POA: Diagnosis not present

## 2018-09-29 DIAGNOSIS — N186 End stage renal disease: Secondary | ICD-10-CM | POA: Diagnosis not present

## 2018-09-30 DIAGNOSIS — D509 Iron deficiency anemia, unspecified: Secondary | ICD-10-CM | POA: Diagnosis not present

## 2018-09-30 DIAGNOSIS — N186 End stage renal disease: Secondary | ICD-10-CM | POA: Diagnosis not present

## 2018-09-30 DIAGNOSIS — D631 Anemia in chronic kidney disease: Secondary | ICD-10-CM | POA: Diagnosis not present

## 2018-09-30 DIAGNOSIS — N2581 Secondary hyperparathyroidism of renal origin: Secondary | ICD-10-CM | POA: Diagnosis not present

## 2018-10-01 DIAGNOSIS — N186 End stage renal disease: Secondary | ICD-10-CM | POA: Diagnosis not present

## 2018-10-01 DIAGNOSIS — D631 Anemia in chronic kidney disease: Secondary | ICD-10-CM | POA: Diagnosis not present

## 2018-10-01 DIAGNOSIS — N2581 Secondary hyperparathyroidism of renal origin: Secondary | ICD-10-CM | POA: Diagnosis not present

## 2018-10-01 DIAGNOSIS — D509 Iron deficiency anemia, unspecified: Secondary | ICD-10-CM | POA: Diagnosis not present

## 2018-10-02 DIAGNOSIS — N186 End stage renal disease: Secondary | ICD-10-CM | POA: Diagnosis not present

## 2018-10-02 DIAGNOSIS — D631 Anemia in chronic kidney disease: Secondary | ICD-10-CM | POA: Diagnosis not present

## 2018-10-02 DIAGNOSIS — D509 Iron deficiency anemia, unspecified: Secondary | ICD-10-CM | POA: Diagnosis not present

## 2018-10-02 DIAGNOSIS — N2581 Secondary hyperparathyroidism of renal origin: Secondary | ICD-10-CM | POA: Diagnosis not present

## 2018-10-03 DIAGNOSIS — D631 Anemia in chronic kidney disease: Secondary | ICD-10-CM | POA: Diagnosis not present

## 2018-10-03 DIAGNOSIS — D509 Iron deficiency anemia, unspecified: Secondary | ICD-10-CM | POA: Diagnosis not present

## 2018-10-03 DIAGNOSIS — N2581 Secondary hyperparathyroidism of renal origin: Secondary | ICD-10-CM | POA: Diagnosis not present

## 2018-10-03 DIAGNOSIS — N186 End stage renal disease: Secondary | ICD-10-CM | POA: Diagnosis not present

## 2018-10-04 DIAGNOSIS — N2581 Secondary hyperparathyroidism of renal origin: Secondary | ICD-10-CM | POA: Diagnosis not present

## 2018-10-04 DIAGNOSIS — N186 End stage renal disease: Secondary | ICD-10-CM | POA: Diagnosis not present

## 2018-10-04 DIAGNOSIS — D509 Iron deficiency anemia, unspecified: Secondary | ICD-10-CM | POA: Diagnosis not present

## 2018-10-04 DIAGNOSIS — D631 Anemia in chronic kidney disease: Secondary | ICD-10-CM | POA: Diagnosis not present

## 2018-10-05 DIAGNOSIS — N186 End stage renal disease: Secondary | ICD-10-CM | POA: Diagnosis not present

## 2018-10-05 DIAGNOSIS — N2581 Secondary hyperparathyroidism of renal origin: Secondary | ICD-10-CM | POA: Diagnosis not present

## 2018-10-05 DIAGNOSIS — D631 Anemia in chronic kidney disease: Secondary | ICD-10-CM | POA: Diagnosis not present

## 2018-10-05 DIAGNOSIS — D509 Iron deficiency anemia, unspecified: Secondary | ICD-10-CM | POA: Diagnosis not present

## 2018-10-06 DIAGNOSIS — D509 Iron deficiency anemia, unspecified: Secondary | ICD-10-CM | POA: Diagnosis not present

## 2018-10-06 DIAGNOSIS — E118 Type 2 diabetes mellitus with unspecified complications: Secondary | ICD-10-CM | POA: Diagnosis not present

## 2018-10-06 DIAGNOSIS — N2581 Secondary hyperparathyroidism of renal origin: Secondary | ICD-10-CM | POA: Diagnosis not present

## 2018-10-06 DIAGNOSIS — D631 Anemia in chronic kidney disease: Secondary | ICD-10-CM | POA: Diagnosis not present

## 2018-10-06 DIAGNOSIS — N186 End stage renal disease: Secondary | ICD-10-CM | POA: Diagnosis not present

## 2018-10-07 DIAGNOSIS — D509 Iron deficiency anemia, unspecified: Secondary | ICD-10-CM | POA: Diagnosis not present

## 2018-10-07 DIAGNOSIS — D631 Anemia in chronic kidney disease: Secondary | ICD-10-CM | POA: Diagnosis not present

## 2018-10-07 DIAGNOSIS — N2581 Secondary hyperparathyroidism of renal origin: Secondary | ICD-10-CM | POA: Diagnosis not present

## 2018-10-07 DIAGNOSIS — N186 End stage renal disease: Secondary | ICD-10-CM | POA: Diagnosis not present

## 2018-10-08 DIAGNOSIS — N186 End stage renal disease: Secondary | ICD-10-CM | POA: Diagnosis not present

## 2018-10-08 DIAGNOSIS — D509 Iron deficiency anemia, unspecified: Secondary | ICD-10-CM | POA: Diagnosis not present

## 2018-10-08 DIAGNOSIS — N2581 Secondary hyperparathyroidism of renal origin: Secondary | ICD-10-CM | POA: Diagnosis not present

## 2018-10-08 DIAGNOSIS — D631 Anemia in chronic kidney disease: Secondary | ICD-10-CM | POA: Diagnosis not present

## 2018-10-09 DIAGNOSIS — D509 Iron deficiency anemia, unspecified: Secondary | ICD-10-CM | POA: Diagnosis not present

## 2018-10-09 DIAGNOSIS — D631 Anemia in chronic kidney disease: Secondary | ICD-10-CM | POA: Diagnosis not present

## 2018-10-09 DIAGNOSIS — N2581 Secondary hyperparathyroidism of renal origin: Secondary | ICD-10-CM | POA: Diagnosis not present

## 2018-10-09 DIAGNOSIS — N186 End stage renal disease: Secondary | ICD-10-CM | POA: Diagnosis not present

## 2018-10-10 DIAGNOSIS — N2581 Secondary hyperparathyroidism of renal origin: Secondary | ICD-10-CM | POA: Diagnosis not present

## 2018-10-10 DIAGNOSIS — N186 End stage renal disease: Secondary | ICD-10-CM | POA: Diagnosis not present

## 2018-10-10 DIAGNOSIS — D509 Iron deficiency anemia, unspecified: Secondary | ICD-10-CM | POA: Diagnosis not present

## 2018-10-10 DIAGNOSIS — D631 Anemia in chronic kidney disease: Secondary | ICD-10-CM | POA: Diagnosis not present

## 2018-10-11 DIAGNOSIS — D631 Anemia in chronic kidney disease: Secondary | ICD-10-CM | POA: Diagnosis not present

## 2018-10-11 DIAGNOSIS — N2581 Secondary hyperparathyroidism of renal origin: Secondary | ICD-10-CM | POA: Diagnosis not present

## 2018-10-11 DIAGNOSIS — D509 Iron deficiency anemia, unspecified: Secondary | ICD-10-CM | POA: Diagnosis not present

## 2018-10-11 DIAGNOSIS — N186 End stage renal disease: Secondary | ICD-10-CM | POA: Diagnosis not present

## 2018-10-12 DIAGNOSIS — D509 Iron deficiency anemia, unspecified: Secondary | ICD-10-CM | POA: Diagnosis not present

## 2018-10-12 DIAGNOSIS — N2581 Secondary hyperparathyroidism of renal origin: Secondary | ICD-10-CM | POA: Diagnosis not present

## 2018-10-12 DIAGNOSIS — N186 End stage renal disease: Secondary | ICD-10-CM | POA: Diagnosis not present

## 2018-10-12 DIAGNOSIS — D631 Anemia in chronic kidney disease: Secondary | ICD-10-CM | POA: Diagnosis not present

## 2018-10-13 DIAGNOSIS — N186 End stage renal disease: Secondary | ICD-10-CM | POA: Diagnosis not present

## 2018-10-13 DIAGNOSIS — D509 Iron deficiency anemia, unspecified: Secondary | ICD-10-CM | POA: Diagnosis not present

## 2018-10-13 DIAGNOSIS — N2581 Secondary hyperparathyroidism of renal origin: Secondary | ICD-10-CM | POA: Diagnosis not present

## 2018-10-13 DIAGNOSIS — D631 Anemia in chronic kidney disease: Secondary | ICD-10-CM | POA: Diagnosis not present

## 2018-10-14 DIAGNOSIS — D631 Anemia in chronic kidney disease: Secondary | ICD-10-CM | POA: Diagnosis not present

## 2018-10-14 DIAGNOSIS — D509 Iron deficiency anemia, unspecified: Secondary | ICD-10-CM | POA: Diagnosis not present

## 2018-10-14 DIAGNOSIS — N186 End stage renal disease: Secondary | ICD-10-CM | POA: Diagnosis not present

## 2018-10-14 DIAGNOSIS — N2581 Secondary hyperparathyroidism of renal origin: Secondary | ICD-10-CM | POA: Diagnosis not present

## 2018-10-15 DIAGNOSIS — N2581 Secondary hyperparathyroidism of renal origin: Secondary | ICD-10-CM | POA: Diagnosis not present

## 2018-10-15 DIAGNOSIS — D631 Anemia in chronic kidney disease: Secondary | ICD-10-CM | POA: Diagnosis not present

## 2018-10-15 DIAGNOSIS — N186 End stage renal disease: Secondary | ICD-10-CM | POA: Diagnosis not present

## 2018-10-15 DIAGNOSIS — D509 Iron deficiency anemia, unspecified: Secondary | ICD-10-CM | POA: Diagnosis not present

## 2018-10-16 DIAGNOSIS — D509 Iron deficiency anemia, unspecified: Secondary | ICD-10-CM | POA: Diagnosis not present

## 2018-10-16 DIAGNOSIS — D631 Anemia in chronic kidney disease: Secondary | ICD-10-CM | POA: Diagnosis not present

## 2018-10-16 DIAGNOSIS — N186 End stage renal disease: Secondary | ICD-10-CM | POA: Diagnosis not present

## 2018-10-16 DIAGNOSIS — N2581 Secondary hyperparathyroidism of renal origin: Secondary | ICD-10-CM | POA: Diagnosis not present

## 2018-10-17 DIAGNOSIS — N186 End stage renal disease: Secondary | ICD-10-CM | POA: Diagnosis not present

## 2018-10-17 DIAGNOSIS — D509 Iron deficiency anemia, unspecified: Secondary | ICD-10-CM | POA: Diagnosis not present

## 2018-10-17 DIAGNOSIS — D631 Anemia in chronic kidney disease: Secondary | ICD-10-CM | POA: Diagnosis not present

## 2018-10-17 DIAGNOSIS — N2581 Secondary hyperparathyroidism of renal origin: Secondary | ICD-10-CM | POA: Diagnosis not present

## 2018-10-18 DIAGNOSIS — N186 End stage renal disease: Secondary | ICD-10-CM | POA: Diagnosis not present

## 2018-10-18 DIAGNOSIS — D631 Anemia in chronic kidney disease: Secondary | ICD-10-CM | POA: Diagnosis not present

## 2018-10-18 DIAGNOSIS — N2581 Secondary hyperparathyroidism of renal origin: Secondary | ICD-10-CM | POA: Diagnosis not present

## 2018-10-18 DIAGNOSIS — D509 Iron deficiency anemia, unspecified: Secondary | ICD-10-CM | POA: Diagnosis not present

## 2018-10-19 DIAGNOSIS — N186 End stage renal disease: Secondary | ICD-10-CM | POA: Diagnosis not present

## 2018-10-19 DIAGNOSIS — D509 Iron deficiency anemia, unspecified: Secondary | ICD-10-CM | POA: Diagnosis not present

## 2018-10-19 DIAGNOSIS — N2581 Secondary hyperparathyroidism of renal origin: Secondary | ICD-10-CM | POA: Diagnosis not present

## 2018-10-19 DIAGNOSIS — D631 Anemia in chronic kidney disease: Secondary | ICD-10-CM | POA: Diagnosis not present

## 2018-10-20 DIAGNOSIS — N186 End stage renal disease: Secondary | ICD-10-CM | POA: Diagnosis not present

## 2018-10-20 DIAGNOSIS — N2581 Secondary hyperparathyroidism of renal origin: Secondary | ICD-10-CM | POA: Diagnosis not present

## 2018-10-20 DIAGNOSIS — D631 Anemia in chronic kidney disease: Secondary | ICD-10-CM | POA: Diagnosis not present

## 2018-10-20 DIAGNOSIS — D509 Iron deficiency anemia, unspecified: Secondary | ICD-10-CM | POA: Diagnosis not present

## 2018-10-21 DIAGNOSIS — D509 Iron deficiency anemia, unspecified: Secondary | ICD-10-CM | POA: Diagnosis not present

## 2018-10-21 DIAGNOSIS — D631 Anemia in chronic kidney disease: Secondary | ICD-10-CM | POA: Diagnosis not present

## 2018-10-21 DIAGNOSIS — N2581 Secondary hyperparathyroidism of renal origin: Secondary | ICD-10-CM | POA: Diagnosis not present

## 2018-10-21 DIAGNOSIS — N186 End stage renal disease: Secondary | ICD-10-CM | POA: Diagnosis not present

## 2018-10-22 DIAGNOSIS — D509 Iron deficiency anemia, unspecified: Secondary | ICD-10-CM | POA: Diagnosis not present

## 2018-10-22 DIAGNOSIS — N2581 Secondary hyperparathyroidism of renal origin: Secondary | ICD-10-CM | POA: Diagnosis not present

## 2018-10-22 DIAGNOSIS — D631 Anemia in chronic kidney disease: Secondary | ICD-10-CM | POA: Diagnosis not present

## 2018-10-22 DIAGNOSIS — N186 End stage renal disease: Secondary | ICD-10-CM | POA: Diagnosis not present

## 2018-10-23 DIAGNOSIS — N186 End stage renal disease: Secondary | ICD-10-CM | POA: Diagnosis not present

## 2018-10-23 DIAGNOSIS — N2581 Secondary hyperparathyroidism of renal origin: Secondary | ICD-10-CM | POA: Diagnosis not present

## 2018-10-23 DIAGNOSIS — D509 Iron deficiency anemia, unspecified: Secondary | ICD-10-CM | POA: Diagnosis not present

## 2018-10-23 DIAGNOSIS — D631 Anemia in chronic kidney disease: Secondary | ICD-10-CM | POA: Diagnosis not present

## 2018-10-24 DIAGNOSIS — N186 End stage renal disease: Secondary | ICD-10-CM | POA: Diagnosis not present

## 2018-10-24 DIAGNOSIS — D631 Anemia in chronic kidney disease: Secondary | ICD-10-CM | POA: Diagnosis not present

## 2018-10-24 DIAGNOSIS — N2581 Secondary hyperparathyroidism of renal origin: Secondary | ICD-10-CM | POA: Diagnosis not present

## 2018-10-24 DIAGNOSIS — D509 Iron deficiency anemia, unspecified: Secondary | ICD-10-CM | POA: Diagnosis not present

## 2018-10-25 DIAGNOSIS — Z992 Dependence on renal dialysis: Secondary | ICD-10-CM | POA: Diagnosis not present

## 2018-10-25 DIAGNOSIS — N186 End stage renal disease: Secondary | ICD-10-CM | POA: Diagnosis not present

## 2018-10-25 DIAGNOSIS — N2581 Secondary hyperparathyroidism of renal origin: Secondary | ICD-10-CM | POA: Diagnosis not present

## 2018-10-25 DIAGNOSIS — D509 Iron deficiency anemia, unspecified: Secondary | ICD-10-CM | POA: Diagnosis not present

## 2018-10-25 DIAGNOSIS — D631 Anemia in chronic kidney disease: Secondary | ICD-10-CM | POA: Diagnosis not present

## 2018-10-26 DIAGNOSIS — D631 Anemia in chronic kidney disease: Secondary | ICD-10-CM | POA: Diagnosis not present

## 2018-10-26 DIAGNOSIS — N186 End stage renal disease: Secondary | ICD-10-CM | POA: Diagnosis not present

## 2018-10-26 DIAGNOSIS — D509 Iron deficiency anemia, unspecified: Secondary | ICD-10-CM | POA: Diagnosis not present

## 2018-10-26 DIAGNOSIS — N2581 Secondary hyperparathyroidism of renal origin: Secondary | ICD-10-CM | POA: Diagnosis not present

## 2018-10-27 DIAGNOSIS — D631 Anemia in chronic kidney disease: Secondary | ICD-10-CM | POA: Diagnosis not present

## 2018-10-27 DIAGNOSIS — N2581 Secondary hyperparathyroidism of renal origin: Secondary | ICD-10-CM | POA: Diagnosis not present

## 2018-10-27 DIAGNOSIS — D509 Iron deficiency anemia, unspecified: Secondary | ICD-10-CM | POA: Diagnosis not present

## 2018-10-27 DIAGNOSIS — N186 End stage renal disease: Secondary | ICD-10-CM | POA: Diagnosis not present

## 2018-10-28 DIAGNOSIS — N2581 Secondary hyperparathyroidism of renal origin: Secondary | ICD-10-CM | POA: Diagnosis not present

## 2018-10-28 DIAGNOSIS — N186 End stage renal disease: Secondary | ICD-10-CM | POA: Diagnosis not present

## 2018-10-28 DIAGNOSIS — D631 Anemia in chronic kidney disease: Secondary | ICD-10-CM | POA: Diagnosis not present

## 2018-10-28 DIAGNOSIS — D509 Iron deficiency anemia, unspecified: Secondary | ICD-10-CM | POA: Diagnosis not present

## 2018-10-29 DIAGNOSIS — D509 Iron deficiency anemia, unspecified: Secondary | ICD-10-CM | POA: Diagnosis not present

## 2018-10-29 DIAGNOSIS — N186 End stage renal disease: Secondary | ICD-10-CM | POA: Diagnosis not present

## 2018-10-29 DIAGNOSIS — N2581 Secondary hyperparathyroidism of renal origin: Secondary | ICD-10-CM | POA: Diagnosis not present

## 2018-10-29 DIAGNOSIS — D631 Anemia in chronic kidney disease: Secondary | ICD-10-CM | POA: Diagnosis not present

## 2018-10-30 DIAGNOSIS — D631 Anemia in chronic kidney disease: Secondary | ICD-10-CM | POA: Diagnosis not present

## 2018-10-30 DIAGNOSIS — N2581 Secondary hyperparathyroidism of renal origin: Secondary | ICD-10-CM | POA: Diagnosis not present

## 2018-10-30 DIAGNOSIS — N186 End stage renal disease: Secondary | ICD-10-CM | POA: Diagnosis not present

## 2018-10-30 DIAGNOSIS — D509 Iron deficiency anemia, unspecified: Secondary | ICD-10-CM | POA: Diagnosis not present

## 2018-10-31 DIAGNOSIS — N186 End stage renal disease: Secondary | ICD-10-CM | POA: Diagnosis not present

## 2018-10-31 DIAGNOSIS — D631 Anemia in chronic kidney disease: Secondary | ICD-10-CM | POA: Diagnosis not present

## 2018-10-31 DIAGNOSIS — D509 Iron deficiency anemia, unspecified: Secondary | ICD-10-CM | POA: Diagnosis not present

## 2018-10-31 DIAGNOSIS — N2581 Secondary hyperparathyroidism of renal origin: Secondary | ICD-10-CM | POA: Diagnosis not present

## 2018-11-01 DIAGNOSIS — N2581 Secondary hyperparathyroidism of renal origin: Secondary | ICD-10-CM | POA: Diagnosis not present

## 2018-11-01 DIAGNOSIS — E118 Type 2 diabetes mellitus with unspecified complications: Secondary | ICD-10-CM | POA: Diagnosis not present

## 2018-11-01 DIAGNOSIS — D509 Iron deficiency anemia, unspecified: Secondary | ICD-10-CM | POA: Diagnosis not present

## 2018-11-01 DIAGNOSIS — D631 Anemia in chronic kidney disease: Secondary | ICD-10-CM | POA: Diagnosis not present

## 2018-11-01 DIAGNOSIS — Z1159 Encounter for screening for other viral diseases: Secondary | ICD-10-CM | POA: Diagnosis not present

## 2018-11-01 DIAGNOSIS — N186 End stage renal disease: Secondary | ICD-10-CM | POA: Diagnosis not present

## 2018-11-01 DIAGNOSIS — Z114 Encounter for screening for human immunodeficiency virus [HIV]: Secondary | ICD-10-CM | POA: Diagnosis not present

## 2018-11-02 DIAGNOSIS — D509 Iron deficiency anemia, unspecified: Secondary | ICD-10-CM | POA: Diagnosis not present

## 2018-11-02 DIAGNOSIS — N186 End stage renal disease: Secondary | ICD-10-CM | POA: Diagnosis not present

## 2018-11-02 DIAGNOSIS — D631 Anemia in chronic kidney disease: Secondary | ICD-10-CM | POA: Diagnosis not present

## 2018-11-02 DIAGNOSIS — N2581 Secondary hyperparathyroidism of renal origin: Secondary | ICD-10-CM | POA: Diagnosis not present

## 2018-11-03 DIAGNOSIS — D631 Anemia in chronic kidney disease: Secondary | ICD-10-CM | POA: Diagnosis not present

## 2018-11-03 DIAGNOSIS — N186 End stage renal disease: Secondary | ICD-10-CM | POA: Diagnosis not present

## 2018-11-03 DIAGNOSIS — D509 Iron deficiency anemia, unspecified: Secondary | ICD-10-CM | POA: Diagnosis not present

## 2018-11-03 DIAGNOSIS — N2581 Secondary hyperparathyroidism of renal origin: Secondary | ICD-10-CM | POA: Diagnosis not present

## 2018-11-04 DIAGNOSIS — N2581 Secondary hyperparathyroidism of renal origin: Secondary | ICD-10-CM | POA: Diagnosis not present

## 2018-11-04 DIAGNOSIS — N186 End stage renal disease: Secondary | ICD-10-CM | POA: Diagnosis not present

## 2018-11-04 DIAGNOSIS — D631 Anemia in chronic kidney disease: Secondary | ICD-10-CM | POA: Diagnosis not present

## 2018-11-04 DIAGNOSIS — D509 Iron deficiency anemia, unspecified: Secondary | ICD-10-CM | POA: Diagnosis not present

## 2018-11-05 DIAGNOSIS — D509 Iron deficiency anemia, unspecified: Secondary | ICD-10-CM | POA: Diagnosis not present

## 2018-11-05 DIAGNOSIS — N186 End stage renal disease: Secondary | ICD-10-CM | POA: Diagnosis not present

## 2018-11-05 DIAGNOSIS — D631 Anemia in chronic kidney disease: Secondary | ICD-10-CM | POA: Diagnosis not present

## 2018-11-05 DIAGNOSIS — N2581 Secondary hyperparathyroidism of renal origin: Secondary | ICD-10-CM | POA: Diagnosis not present

## 2018-11-06 DIAGNOSIS — N2581 Secondary hyperparathyroidism of renal origin: Secondary | ICD-10-CM | POA: Diagnosis not present

## 2018-11-06 DIAGNOSIS — D509 Iron deficiency anemia, unspecified: Secondary | ICD-10-CM | POA: Diagnosis not present

## 2018-11-06 DIAGNOSIS — D631 Anemia in chronic kidney disease: Secondary | ICD-10-CM | POA: Diagnosis not present

## 2018-11-06 DIAGNOSIS — N186 End stage renal disease: Secondary | ICD-10-CM | POA: Diagnosis not present

## 2018-11-07 DIAGNOSIS — D509 Iron deficiency anemia, unspecified: Secondary | ICD-10-CM | POA: Diagnosis not present

## 2018-11-07 DIAGNOSIS — N186 End stage renal disease: Secondary | ICD-10-CM | POA: Diagnosis not present

## 2018-11-07 DIAGNOSIS — N2581 Secondary hyperparathyroidism of renal origin: Secondary | ICD-10-CM | POA: Diagnosis not present

## 2018-11-07 DIAGNOSIS — D631 Anemia in chronic kidney disease: Secondary | ICD-10-CM | POA: Diagnosis not present

## 2018-11-08 DIAGNOSIS — N2581 Secondary hyperparathyroidism of renal origin: Secondary | ICD-10-CM | POA: Diagnosis not present

## 2018-11-08 DIAGNOSIS — D509 Iron deficiency anemia, unspecified: Secondary | ICD-10-CM | POA: Diagnosis not present

## 2018-11-08 DIAGNOSIS — N186 End stage renal disease: Secondary | ICD-10-CM | POA: Diagnosis not present

## 2018-11-08 DIAGNOSIS — D631 Anemia in chronic kidney disease: Secondary | ICD-10-CM | POA: Diagnosis not present

## 2018-11-09 DIAGNOSIS — N186 End stage renal disease: Secondary | ICD-10-CM | POA: Diagnosis not present

## 2018-11-09 DIAGNOSIS — D631 Anemia in chronic kidney disease: Secondary | ICD-10-CM | POA: Diagnosis not present

## 2018-11-09 DIAGNOSIS — D509 Iron deficiency anemia, unspecified: Secondary | ICD-10-CM | POA: Diagnosis not present

## 2018-11-09 DIAGNOSIS — N2581 Secondary hyperparathyroidism of renal origin: Secondary | ICD-10-CM | POA: Diagnosis not present

## 2018-11-10 DIAGNOSIS — D509 Iron deficiency anemia, unspecified: Secondary | ICD-10-CM | POA: Diagnosis not present

## 2018-11-10 DIAGNOSIS — D631 Anemia in chronic kidney disease: Secondary | ICD-10-CM | POA: Diagnosis not present

## 2018-11-10 DIAGNOSIS — N2581 Secondary hyperparathyroidism of renal origin: Secondary | ICD-10-CM | POA: Diagnosis not present

## 2018-11-10 DIAGNOSIS — N186 End stage renal disease: Secondary | ICD-10-CM | POA: Diagnosis not present

## 2018-11-11 DIAGNOSIS — N2581 Secondary hyperparathyroidism of renal origin: Secondary | ICD-10-CM | POA: Diagnosis not present

## 2018-11-11 DIAGNOSIS — D631 Anemia in chronic kidney disease: Secondary | ICD-10-CM | POA: Diagnosis not present

## 2018-11-11 DIAGNOSIS — N186 End stage renal disease: Secondary | ICD-10-CM | POA: Diagnosis not present

## 2018-11-11 DIAGNOSIS — D509 Iron deficiency anemia, unspecified: Secondary | ICD-10-CM | POA: Diagnosis not present

## 2018-11-12 DIAGNOSIS — N186 End stage renal disease: Secondary | ICD-10-CM | POA: Diagnosis not present

## 2018-11-12 DIAGNOSIS — N2581 Secondary hyperparathyroidism of renal origin: Secondary | ICD-10-CM | POA: Diagnosis not present

## 2018-11-12 DIAGNOSIS — D631 Anemia in chronic kidney disease: Secondary | ICD-10-CM | POA: Diagnosis not present

## 2018-11-12 DIAGNOSIS — D509 Iron deficiency anemia, unspecified: Secondary | ICD-10-CM | POA: Diagnosis not present

## 2018-11-13 DIAGNOSIS — N186 End stage renal disease: Secondary | ICD-10-CM | POA: Diagnosis not present

## 2018-11-13 DIAGNOSIS — D509 Iron deficiency anemia, unspecified: Secondary | ICD-10-CM | POA: Diagnosis not present

## 2018-11-13 DIAGNOSIS — N2581 Secondary hyperparathyroidism of renal origin: Secondary | ICD-10-CM | POA: Diagnosis not present

## 2018-11-13 DIAGNOSIS — D631 Anemia in chronic kidney disease: Secondary | ICD-10-CM | POA: Diagnosis not present

## 2018-11-14 DIAGNOSIS — D631 Anemia in chronic kidney disease: Secondary | ICD-10-CM | POA: Diagnosis not present

## 2018-11-14 DIAGNOSIS — D509 Iron deficiency anemia, unspecified: Secondary | ICD-10-CM | POA: Diagnosis not present

## 2018-11-14 DIAGNOSIS — N2581 Secondary hyperparathyroidism of renal origin: Secondary | ICD-10-CM | POA: Diagnosis not present

## 2018-11-14 DIAGNOSIS — N186 End stage renal disease: Secondary | ICD-10-CM | POA: Diagnosis not present

## 2018-11-15 DIAGNOSIS — D509 Iron deficiency anemia, unspecified: Secondary | ICD-10-CM | POA: Diagnosis not present

## 2018-11-15 DIAGNOSIS — D631 Anemia in chronic kidney disease: Secondary | ICD-10-CM | POA: Diagnosis not present

## 2018-11-15 DIAGNOSIS — N186 End stage renal disease: Secondary | ICD-10-CM | POA: Diagnosis not present

## 2018-11-15 DIAGNOSIS — N2581 Secondary hyperparathyroidism of renal origin: Secondary | ICD-10-CM | POA: Diagnosis not present

## 2018-11-16 DIAGNOSIS — D631 Anemia in chronic kidney disease: Secondary | ICD-10-CM | POA: Diagnosis not present

## 2018-11-16 DIAGNOSIS — N186 End stage renal disease: Secondary | ICD-10-CM | POA: Diagnosis not present

## 2018-11-16 DIAGNOSIS — D509 Iron deficiency anemia, unspecified: Secondary | ICD-10-CM | POA: Diagnosis not present

## 2018-11-16 DIAGNOSIS — N2581 Secondary hyperparathyroidism of renal origin: Secondary | ICD-10-CM | POA: Diagnosis not present

## 2018-11-17 DIAGNOSIS — D631 Anemia in chronic kidney disease: Secondary | ICD-10-CM | POA: Diagnosis not present

## 2018-11-17 DIAGNOSIS — N2581 Secondary hyperparathyroidism of renal origin: Secondary | ICD-10-CM | POA: Diagnosis not present

## 2018-11-17 DIAGNOSIS — D509 Iron deficiency anemia, unspecified: Secondary | ICD-10-CM | POA: Diagnosis not present

## 2018-11-17 DIAGNOSIS — N186 End stage renal disease: Secondary | ICD-10-CM | POA: Diagnosis not present

## 2018-11-18 DIAGNOSIS — N2581 Secondary hyperparathyroidism of renal origin: Secondary | ICD-10-CM | POA: Diagnosis not present

## 2018-11-18 DIAGNOSIS — N186 End stage renal disease: Secondary | ICD-10-CM | POA: Diagnosis not present

## 2018-11-18 DIAGNOSIS — D631 Anemia in chronic kidney disease: Secondary | ICD-10-CM | POA: Diagnosis not present

## 2018-11-18 DIAGNOSIS — D509 Iron deficiency anemia, unspecified: Secondary | ICD-10-CM | POA: Diagnosis not present

## 2018-11-19 DIAGNOSIS — D509 Iron deficiency anemia, unspecified: Secondary | ICD-10-CM | POA: Diagnosis not present

## 2018-11-19 DIAGNOSIS — N2581 Secondary hyperparathyroidism of renal origin: Secondary | ICD-10-CM | POA: Diagnosis not present

## 2018-11-19 DIAGNOSIS — N186 End stage renal disease: Secondary | ICD-10-CM | POA: Diagnosis not present

## 2018-11-19 DIAGNOSIS — D631 Anemia in chronic kidney disease: Secondary | ICD-10-CM | POA: Diagnosis not present

## 2018-11-20 DIAGNOSIS — N2581 Secondary hyperparathyroidism of renal origin: Secondary | ICD-10-CM | POA: Diagnosis not present

## 2018-11-20 DIAGNOSIS — D631 Anemia in chronic kidney disease: Secondary | ICD-10-CM | POA: Diagnosis not present

## 2018-11-20 DIAGNOSIS — N186 End stage renal disease: Secondary | ICD-10-CM | POA: Diagnosis not present

## 2018-11-20 DIAGNOSIS — D509 Iron deficiency anemia, unspecified: Secondary | ICD-10-CM | POA: Diagnosis not present

## 2018-11-21 DIAGNOSIS — D631 Anemia in chronic kidney disease: Secondary | ICD-10-CM | POA: Diagnosis not present

## 2018-11-21 DIAGNOSIS — N186 End stage renal disease: Secondary | ICD-10-CM | POA: Diagnosis not present

## 2018-11-21 DIAGNOSIS — N2581 Secondary hyperparathyroidism of renal origin: Secondary | ICD-10-CM | POA: Diagnosis not present

## 2018-11-21 DIAGNOSIS — D509 Iron deficiency anemia, unspecified: Secondary | ICD-10-CM | POA: Diagnosis not present

## 2018-11-22 DIAGNOSIS — D509 Iron deficiency anemia, unspecified: Secondary | ICD-10-CM | POA: Diagnosis not present

## 2018-11-22 DIAGNOSIS — N186 End stage renal disease: Secondary | ICD-10-CM | POA: Diagnosis not present

## 2018-11-22 DIAGNOSIS — N2581 Secondary hyperparathyroidism of renal origin: Secondary | ICD-10-CM | POA: Diagnosis not present

## 2018-11-22 DIAGNOSIS — D631 Anemia in chronic kidney disease: Secondary | ICD-10-CM | POA: Diagnosis not present

## 2018-11-23 DIAGNOSIS — D509 Iron deficiency anemia, unspecified: Secondary | ICD-10-CM | POA: Diagnosis not present

## 2018-11-23 DIAGNOSIS — D631 Anemia in chronic kidney disease: Secondary | ICD-10-CM | POA: Diagnosis not present

## 2018-11-23 DIAGNOSIS — N2581 Secondary hyperparathyroidism of renal origin: Secondary | ICD-10-CM | POA: Diagnosis not present

## 2018-11-23 DIAGNOSIS — N186 End stage renal disease: Secondary | ICD-10-CM | POA: Diagnosis not present

## 2018-11-24 DIAGNOSIS — Z992 Dependence on renal dialysis: Secondary | ICD-10-CM | POA: Diagnosis not present

## 2018-11-24 DIAGNOSIS — D509 Iron deficiency anemia, unspecified: Secondary | ICD-10-CM | POA: Diagnosis not present

## 2018-11-24 DIAGNOSIS — N186 End stage renal disease: Secondary | ICD-10-CM | POA: Diagnosis not present

## 2018-11-24 DIAGNOSIS — N2581 Secondary hyperparathyroidism of renal origin: Secondary | ICD-10-CM | POA: Diagnosis not present

## 2018-11-24 DIAGNOSIS — D631 Anemia in chronic kidney disease: Secondary | ICD-10-CM | POA: Diagnosis not present

## 2018-11-25 DIAGNOSIS — N2581 Secondary hyperparathyroidism of renal origin: Secondary | ICD-10-CM | POA: Diagnosis not present

## 2018-11-25 DIAGNOSIS — D631 Anemia in chronic kidney disease: Secondary | ICD-10-CM | POA: Diagnosis not present

## 2018-11-25 DIAGNOSIS — D509 Iron deficiency anemia, unspecified: Secondary | ICD-10-CM | POA: Diagnosis not present

## 2018-11-25 DIAGNOSIS — Z23 Encounter for immunization: Secondary | ICD-10-CM | POA: Diagnosis not present

## 2018-11-25 DIAGNOSIS — N186 End stage renal disease: Secondary | ICD-10-CM | POA: Diagnosis not present

## 2018-11-26 DIAGNOSIS — D509 Iron deficiency anemia, unspecified: Secondary | ICD-10-CM | POA: Diagnosis not present

## 2018-11-26 DIAGNOSIS — D631 Anemia in chronic kidney disease: Secondary | ICD-10-CM | POA: Diagnosis not present

## 2018-11-26 DIAGNOSIS — Z23 Encounter for immunization: Secondary | ICD-10-CM | POA: Diagnosis not present

## 2018-11-26 DIAGNOSIS — N2581 Secondary hyperparathyroidism of renal origin: Secondary | ICD-10-CM | POA: Diagnosis not present

## 2018-11-26 DIAGNOSIS — N186 End stage renal disease: Secondary | ICD-10-CM | POA: Diagnosis not present

## 2018-11-27 DIAGNOSIS — N2581 Secondary hyperparathyroidism of renal origin: Secondary | ICD-10-CM | POA: Diagnosis not present

## 2018-11-27 DIAGNOSIS — N186 End stage renal disease: Secondary | ICD-10-CM | POA: Diagnosis not present

## 2018-11-27 DIAGNOSIS — D509 Iron deficiency anemia, unspecified: Secondary | ICD-10-CM | POA: Diagnosis not present

## 2018-11-27 DIAGNOSIS — D631 Anemia in chronic kidney disease: Secondary | ICD-10-CM | POA: Diagnosis not present

## 2018-11-27 DIAGNOSIS — Z23 Encounter for immunization: Secondary | ICD-10-CM | POA: Diagnosis not present

## 2018-11-28 DIAGNOSIS — D509 Iron deficiency anemia, unspecified: Secondary | ICD-10-CM | POA: Diagnosis not present

## 2018-11-28 DIAGNOSIS — N186 End stage renal disease: Secondary | ICD-10-CM | POA: Diagnosis not present

## 2018-11-28 DIAGNOSIS — D631 Anemia in chronic kidney disease: Secondary | ICD-10-CM | POA: Diagnosis not present

## 2018-11-28 DIAGNOSIS — Z23 Encounter for immunization: Secondary | ICD-10-CM | POA: Diagnosis not present

## 2018-11-28 DIAGNOSIS — N2581 Secondary hyperparathyroidism of renal origin: Secondary | ICD-10-CM | POA: Diagnosis not present

## 2018-11-29 ENCOUNTER — Other Ambulatory Visit: Payer: Self-pay | Admitting: Sports Medicine

## 2018-11-29 DIAGNOSIS — D631 Anemia in chronic kidney disease: Secondary | ICD-10-CM | POA: Diagnosis not present

## 2018-11-29 DIAGNOSIS — N2581 Secondary hyperparathyroidism of renal origin: Secondary | ICD-10-CM | POA: Diagnosis not present

## 2018-11-29 DIAGNOSIS — Z23 Encounter for immunization: Secondary | ICD-10-CM | POA: Diagnosis not present

## 2018-11-29 DIAGNOSIS — N186 End stage renal disease: Secondary | ICD-10-CM | POA: Diagnosis not present

## 2018-11-29 DIAGNOSIS — D509 Iron deficiency anemia, unspecified: Secondary | ICD-10-CM | POA: Diagnosis not present

## 2018-11-29 MED ORDER — PIOGLITAZONE HCL 30 MG PO TABS: 30.0000 mg | ORAL_TABLET | Freq: Every day | ORAL | 0 refills | Status: DC

## 2018-11-30 DIAGNOSIS — D509 Iron deficiency anemia, unspecified: Secondary | ICD-10-CM | POA: Diagnosis not present

## 2018-11-30 DIAGNOSIS — D631 Anemia in chronic kidney disease: Secondary | ICD-10-CM | POA: Diagnosis not present

## 2018-11-30 DIAGNOSIS — N186 End stage renal disease: Secondary | ICD-10-CM | POA: Diagnosis not present

## 2018-11-30 DIAGNOSIS — Z23 Encounter for immunization: Secondary | ICD-10-CM | POA: Diagnosis not present

## 2018-11-30 DIAGNOSIS — N2581 Secondary hyperparathyroidism of renal origin: Secondary | ICD-10-CM | POA: Diagnosis not present

## 2018-12-01 DIAGNOSIS — Z23 Encounter for immunization: Secondary | ICD-10-CM | POA: Diagnosis not present

## 2018-12-01 DIAGNOSIS — D509 Iron deficiency anemia, unspecified: Secondary | ICD-10-CM | POA: Diagnosis not present

## 2018-12-01 DIAGNOSIS — D631 Anemia in chronic kidney disease: Secondary | ICD-10-CM | POA: Diagnosis not present

## 2018-12-01 DIAGNOSIS — N2581 Secondary hyperparathyroidism of renal origin: Secondary | ICD-10-CM | POA: Diagnosis not present

## 2018-12-01 DIAGNOSIS — N186 End stage renal disease: Secondary | ICD-10-CM | POA: Diagnosis not present

## 2018-12-02 DIAGNOSIS — N2581 Secondary hyperparathyroidism of renal origin: Secondary | ICD-10-CM | POA: Diagnosis not present

## 2018-12-02 DIAGNOSIS — Z23 Encounter for immunization: Secondary | ICD-10-CM | POA: Diagnosis not present

## 2018-12-02 DIAGNOSIS — D509 Iron deficiency anemia, unspecified: Secondary | ICD-10-CM | POA: Diagnosis not present

## 2018-12-02 DIAGNOSIS — N186 End stage renal disease: Secondary | ICD-10-CM | POA: Diagnosis not present

## 2018-12-02 DIAGNOSIS — D631 Anemia in chronic kidney disease: Secondary | ICD-10-CM | POA: Diagnosis not present

## 2018-12-03 DIAGNOSIS — N2581 Secondary hyperparathyroidism of renal origin: Secondary | ICD-10-CM | POA: Diagnosis not present

## 2018-12-03 DIAGNOSIS — Z23 Encounter for immunization: Secondary | ICD-10-CM | POA: Diagnosis not present

## 2018-12-03 DIAGNOSIS — D631 Anemia in chronic kidney disease: Secondary | ICD-10-CM | POA: Diagnosis not present

## 2018-12-03 DIAGNOSIS — D509 Iron deficiency anemia, unspecified: Secondary | ICD-10-CM | POA: Diagnosis not present

## 2018-12-03 DIAGNOSIS — N186 End stage renal disease: Secondary | ICD-10-CM | POA: Diagnosis not present

## 2018-12-04 DIAGNOSIS — N186 End stage renal disease: Secondary | ICD-10-CM | POA: Diagnosis not present

## 2018-12-04 DIAGNOSIS — D509 Iron deficiency anemia, unspecified: Secondary | ICD-10-CM | POA: Diagnosis not present

## 2018-12-04 DIAGNOSIS — D631 Anemia in chronic kidney disease: Secondary | ICD-10-CM | POA: Diagnosis not present

## 2018-12-04 DIAGNOSIS — Z23 Encounter for immunization: Secondary | ICD-10-CM | POA: Diagnosis not present

## 2018-12-04 DIAGNOSIS — N2581 Secondary hyperparathyroidism of renal origin: Secondary | ICD-10-CM | POA: Diagnosis not present

## 2018-12-05 DIAGNOSIS — N2581 Secondary hyperparathyroidism of renal origin: Secondary | ICD-10-CM | POA: Diagnosis not present

## 2018-12-05 DIAGNOSIS — D509 Iron deficiency anemia, unspecified: Secondary | ICD-10-CM | POA: Diagnosis not present

## 2018-12-05 DIAGNOSIS — N186 End stage renal disease: Secondary | ICD-10-CM | POA: Diagnosis not present

## 2018-12-05 DIAGNOSIS — Z23 Encounter for immunization: Secondary | ICD-10-CM | POA: Diagnosis not present

## 2018-12-05 DIAGNOSIS — D631 Anemia in chronic kidney disease: Secondary | ICD-10-CM | POA: Diagnosis not present

## 2018-12-06 DIAGNOSIS — N186 End stage renal disease: Secondary | ICD-10-CM | POA: Diagnosis not present

## 2018-12-06 DIAGNOSIS — Z4932 Encounter for adequacy testing for peritoneal dialysis: Secondary | ICD-10-CM | POA: Diagnosis not present

## 2018-12-06 DIAGNOSIS — E118 Type 2 diabetes mellitus with unspecified complications: Secondary | ICD-10-CM | POA: Diagnosis not present

## 2018-12-06 DIAGNOSIS — Z23 Encounter for immunization: Secondary | ICD-10-CM | POA: Diagnosis not present

## 2018-12-06 DIAGNOSIS — N2581 Secondary hyperparathyroidism of renal origin: Secondary | ICD-10-CM | POA: Diagnosis not present

## 2018-12-06 DIAGNOSIS — D631 Anemia in chronic kidney disease: Secondary | ICD-10-CM | POA: Diagnosis not present

## 2018-12-06 DIAGNOSIS — D509 Iron deficiency anemia, unspecified: Secondary | ICD-10-CM | POA: Diagnosis not present

## 2018-12-07 DIAGNOSIS — N186 End stage renal disease: Secondary | ICD-10-CM | POA: Diagnosis not present

## 2018-12-07 DIAGNOSIS — D509 Iron deficiency anemia, unspecified: Secondary | ICD-10-CM | POA: Diagnosis not present

## 2018-12-07 DIAGNOSIS — D631 Anemia in chronic kidney disease: Secondary | ICD-10-CM | POA: Diagnosis not present

## 2018-12-07 DIAGNOSIS — N2581 Secondary hyperparathyroidism of renal origin: Secondary | ICD-10-CM | POA: Diagnosis not present

## 2018-12-07 DIAGNOSIS — Z23 Encounter for immunization: Secondary | ICD-10-CM | POA: Diagnosis not present

## 2018-12-08 DIAGNOSIS — N186 End stage renal disease: Secondary | ICD-10-CM | POA: Diagnosis not present

## 2018-12-08 DIAGNOSIS — D509 Iron deficiency anemia, unspecified: Secondary | ICD-10-CM | POA: Diagnosis not present

## 2018-12-08 DIAGNOSIS — D631 Anemia in chronic kidney disease: Secondary | ICD-10-CM | POA: Diagnosis not present

## 2018-12-08 DIAGNOSIS — N2581 Secondary hyperparathyroidism of renal origin: Secondary | ICD-10-CM | POA: Diagnosis not present

## 2018-12-08 DIAGNOSIS — Z23 Encounter for immunization: Secondary | ICD-10-CM | POA: Diagnosis not present

## 2018-12-09 DIAGNOSIS — N186 End stage renal disease: Secondary | ICD-10-CM | POA: Diagnosis not present

## 2018-12-09 DIAGNOSIS — Z23 Encounter for immunization: Secondary | ICD-10-CM | POA: Diagnosis not present

## 2018-12-09 DIAGNOSIS — D631 Anemia in chronic kidney disease: Secondary | ICD-10-CM | POA: Diagnosis not present

## 2018-12-09 DIAGNOSIS — N2581 Secondary hyperparathyroidism of renal origin: Secondary | ICD-10-CM | POA: Diagnosis not present

## 2018-12-09 DIAGNOSIS — D509 Iron deficiency anemia, unspecified: Secondary | ICD-10-CM | POA: Diagnosis not present

## 2018-12-10 DIAGNOSIS — D631 Anemia in chronic kidney disease: Secondary | ICD-10-CM | POA: Diagnosis not present

## 2018-12-10 DIAGNOSIS — D509 Iron deficiency anemia, unspecified: Secondary | ICD-10-CM | POA: Diagnosis not present

## 2018-12-10 DIAGNOSIS — Z23 Encounter for immunization: Secondary | ICD-10-CM | POA: Diagnosis not present

## 2018-12-10 DIAGNOSIS — N186 End stage renal disease: Secondary | ICD-10-CM | POA: Diagnosis not present

## 2018-12-10 DIAGNOSIS — N2581 Secondary hyperparathyroidism of renal origin: Secondary | ICD-10-CM | POA: Diagnosis not present

## 2018-12-11 DIAGNOSIS — D509 Iron deficiency anemia, unspecified: Secondary | ICD-10-CM | POA: Diagnosis not present

## 2018-12-11 DIAGNOSIS — D631 Anemia in chronic kidney disease: Secondary | ICD-10-CM | POA: Diagnosis not present

## 2018-12-11 DIAGNOSIS — N2581 Secondary hyperparathyroidism of renal origin: Secondary | ICD-10-CM | POA: Diagnosis not present

## 2018-12-11 DIAGNOSIS — Z23 Encounter for immunization: Secondary | ICD-10-CM | POA: Diagnosis not present

## 2018-12-11 DIAGNOSIS — N186 End stage renal disease: Secondary | ICD-10-CM | POA: Diagnosis not present

## 2018-12-12 DIAGNOSIS — Z23 Encounter for immunization: Secondary | ICD-10-CM | POA: Diagnosis not present

## 2018-12-12 DIAGNOSIS — D631 Anemia in chronic kidney disease: Secondary | ICD-10-CM | POA: Diagnosis not present

## 2018-12-12 DIAGNOSIS — D509 Iron deficiency anemia, unspecified: Secondary | ICD-10-CM | POA: Diagnosis not present

## 2018-12-12 DIAGNOSIS — N2581 Secondary hyperparathyroidism of renal origin: Secondary | ICD-10-CM | POA: Diagnosis not present

## 2018-12-12 DIAGNOSIS — N186 End stage renal disease: Secondary | ICD-10-CM | POA: Diagnosis not present

## 2018-12-13 DIAGNOSIS — N186 End stage renal disease: Secondary | ICD-10-CM | POA: Diagnosis not present

## 2018-12-13 DIAGNOSIS — D631 Anemia in chronic kidney disease: Secondary | ICD-10-CM | POA: Diagnosis not present

## 2018-12-13 DIAGNOSIS — N2581 Secondary hyperparathyroidism of renal origin: Secondary | ICD-10-CM | POA: Diagnosis not present

## 2018-12-13 DIAGNOSIS — Z23 Encounter for immunization: Secondary | ICD-10-CM | POA: Diagnosis not present

## 2018-12-13 DIAGNOSIS — D509 Iron deficiency anemia, unspecified: Secondary | ICD-10-CM | POA: Diagnosis not present

## 2018-12-14 DIAGNOSIS — D509 Iron deficiency anemia, unspecified: Secondary | ICD-10-CM | POA: Diagnosis not present

## 2018-12-14 DIAGNOSIS — Z23 Encounter for immunization: Secondary | ICD-10-CM | POA: Diagnosis not present

## 2018-12-14 DIAGNOSIS — N186 End stage renal disease: Secondary | ICD-10-CM | POA: Diagnosis not present

## 2018-12-14 DIAGNOSIS — N2581 Secondary hyperparathyroidism of renal origin: Secondary | ICD-10-CM | POA: Diagnosis not present

## 2018-12-14 DIAGNOSIS — D631 Anemia in chronic kidney disease: Secondary | ICD-10-CM | POA: Diagnosis not present

## 2018-12-15 DIAGNOSIS — N2581 Secondary hyperparathyroidism of renal origin: Secondary | ICD-10-CM | POA: Diagnosis not present

## 2018-12-15 DIAGNOSIS — N186 End stage renal disease: Secondary | ICD-10-CM | POA: Diagnosis not present

## 2018-12-15 DIAGNOSIS — D631 Anemia in chronic kidney disease: Secondary | ICD-10-CM | POA: Diagnosis not present

## 2018-12-15 DIAGNOSIS — Z23 Encounter for immunization: Secondary | ICD-10-CM | POA: Diagnosis not present

## 2018-12-15 DIAGNOSIS — D509 Iron deficiency anemia, unspecified: Secondary | ICD-10-CM | POA: Diagnosis not present

## 2018-12-16 DIAGNOSIS — D631 Anemia in chronic kidney disease: Secondary | ICD-10-CM | POA: Diagnosis not present

## 2018-12-16 DIAGNOSIS — N2581 Secondary hyperparathyroidism of renal origin: Secondary | ICD-10-CM | POA: Diagnosis not present

## 2018-12-16 DIAGNOSIS — N186 End stage renal disease: Secondary | ICD-10-CM | POA: Diagnosis not present

## 2018-12-16 DIAGNOSIS — D509 Iron deficiency anemia, unspecified: Secondary | ICD-10-CM | POA: Diagnosis not present

## 2018-12-16 DIAGNOSIS — Z23 Encounter for immunization: Secondary | ICD-10-CM | POA: Diagnosis not present

## 2018-12-17 DIAGNOSIS — D631 Anemia in chronic kidney disease: Secondary | ICD-10-CM | POA: Diagnosis not present

## 2018-12-17 DIAGNOSIS — Z23 Encounter for immunization: Secondary | ICD-10-CM | POA: Diagnosis not present

## 2018-12-17 DIAGNOSIS — D509 Iron deficiency anemia, unspecified: Secondary | ICD-10-CM | POA: Diagnosis not present

## 2018-12-17 DIAGNOSIS — N2581 Secondary hyperparathyroidism of renal origin: Secondary | ICD-10-CM | POA: Diagnosis not present

## 2018-12-17 DIAGNOSIS — N186 End stage renal disease: Secondary | ICD-10-CM | POA: Diagnosis not present

## 2018-12-18 DIAGNOSIS — N2581 Secondary hyperparathyroidism of renal origin: Secondary | ICD-10-CM | POA: Diagnosis not present

## 2018-12-18 DIAGNOSIS — N186 End stage renal disease: Secondary | ICD-10-CM | POA: Diagnosis not present

## 2018-12-18 DIAGNOSIS — D509 Iron deficiency anemia, unspecified: Secondary | ICD-10-CM | POA: Diagnosis not present

## 2018-12-18 DIAGNOSIS — Z23 Encounter for immunization: Secondary | ICD-10-CM | POA: Diagnosis not present

## 2018-12-18 DIAGNOSIS — D631 Anemia in chronic kidney disease: Secondary | ICD-10-CM | POA: Diagnosis not present

## 2018-12-19 DIAGNOSIS — N186 End stage renal disease: Secondary | ICD-10-CM | POA: Diagnosis not present

## 2018-12-19 DIAGNOSIS — Z23 Encounter for immunization: Secondary | ICD-10-CM | POA: Diagnosis not present

## 2018-12-19 DIAGNOSIS — D631 Anemia in chronic kidney disease: Secondary | ICD-10-CM | POA: Diagnosis not present

## 2018-12-19 DIAGNOSIS — D509 Iron deficiency anemia, unspecified: Secondary | ICD-10-CM | POA: Diagnosis not present

## 2018-12-19 DIAGNOSIS — N2581 Secondary hyperparathyroidism of renal origin: Secondary | ICD-10-CM | POA: Diagnosis not present

## 2018-12-20 DIAGNOSIS — Z23 Encounter for immunization: Secondary | ICD-10-CM | POA: Diagnosis not present

## 2018-12-20 DIAGNOSIS — D631 Anemia in chronic kidney disease: Secondary | ICD-10-CM | POA: Diagnosis not present

## 2018-12-20 DIAGNOSIS — N186 End stage renal disease: Secondary | ICD-10-CM | POA: Diagnosis not present

## 2018-12-20 DIAGNOSIS — N2581 Secondary hyperparathyroidism of renal origin: Secondary | ICD-10-CM | POA: Diagnosis not present

## 2018-12-20 DIAGNOSIS — D509 Iron deficiency anemia, unspecified: Secondary | ICD-10-CM | POA: Diagnosis not present

## 2018-12-21 DIAGNOSIS — D631 Anemia in chronic kidney disease: Secondary | ICD-10-CM | POA: Diagnosis not present

## 2018-12-21 DIAGNOSIS — N2581 Secondary hyperparathyroidism of renal origin: Secondary | ICD-10-CM | POA: Diagnosis not present

## 2018-12-21 DIAGNOSIS — D509 Iron deficiency anemia, unspecified: Secondary | ICD-10-CM | POA: Diagnosis not present

## 2018-12-21 DIAGNOSIS — Z23 Encounter for immunization: Secondary | ICD-10-CM | POA: Diagnosis not present

## 2018-12-21 DIAGNOSIS — N186 End stage renal disease: Secondary | ICD-10-CM | POA: Diagnosis not present

## 2018-12-22 DIAGNOSIS — Z23 Encounter for immunization: Secondary | ICD-10-CM | POA: Diagnosis not present

## 2018-12-22 DIAGNOSIS — N186 End stage renal disease: Secondary | ICD-10-CM | POA: Diagnosis not present

## 2018-12-22 DIAGNOSIS — D631 Anemia in chronic kidney disease: Secondary | ICD-10-CM | POA: Diagnosis not present

## 2018-12-22 DIAGNOSIS — D509 Iron deficiency anemia, unspecified: Secondary | ICD-10-CM | POA: Diagnosis not present

## 2018-12-22 DIAGNOSIS — N2581 Secondary hyperparathyroidism of renal origin: Secondary | ICD-10-CM | POA: Diagnosis not present

## 2018-12-23 DIAGNOSIS — Z23 Encounter for immunization: Secondary | ICD-10-CM | POA: Diagnosis not present

## 2018-12-23 DIAGNOSIS — N186 End stage renal disease: Secondary | ICD-10-CM | POA: Diagnosis not present

## 2018-12-23 DIAGNOSIS — N2581 Secondary hyperparathyroidism of renal origin: Secondary | ICD-10-CM | POA: Diagnosis not present

## 2018-12-23 DIAGNOSIS — D509 Iron deficiency anemia, unspecified: Secondary | ICD-10-CM | POA: Diagnosis not present

## 2018-12-23 DIAGNOSIS — D631 Anemia in chronic kidney disease: Secondary | ICD-10-CM | POA: Diagnosis not present

## 2018-12-24 DIAGNOSIS — D509 Iron deficiency anemia, unspecified: Secondary | ICD-10-CM | POA: Diagnosis not present

## 2018-12-24 DIAGNOSIS — N186 End stage renal disease: Secondary | ICD-10-CM | POA: Diagnosis not present

## 2018-12-24 DIAGNOSIS — Z23 Encounter for immunization: Secondary | ICD-10-CM | POA: Diagnosis not present

## 2018-12-24 DIAGNOSIS — D631 Anemia in chronic kidney disease: Secondary | ICD-10-CM | POA: Diagnosis not present

## 2018-12-24 DIAGNOSIS — N2581 Secondary hyperparathyroidism of renal origin: Secondary | ICD-10-CM | POA: Diagnosis not present

## 2018-12-25 DIAGNOSIS — Z992 Dependence on renal dialysis: Secondary | ICD-10-CM | POA: Diagnosis not present

## 2018-12-25 DIAGNOSIS — D509 Iron deficiency anemia, unspecified: Secondary | ICD-10-CM | POA: Diagnosis not present

## 2018-12-25 DIAGNOSIS — Z23 Encounter for immunization: Secondary | ICD-10-CM | POA: Diagnosis not present

## 2018-12-25 DIAGNOSIS — D631 Anemia in chronic kidney disease: Secondary | ICD-10-CM | POA: Diagnosis not present

## 2018-12-25 DIAGNOSIS — N2581 Secondary hyperparathyroidism of renal origin: Secondary | ICD-10-CM | POA: Diagnosis not present

## 2018-12-25 DIAGNOSIS — N186 End stage renal disease: Secondary | ICD-10-CM | POA: Diagnosis not present

## 2018-12-26 DIAGNOSIS — D631 Anemia in chronic kidney disease: Secondary | ICD-10-CM | POA: Diagnosis not present

## 2018-12-26 DIAGNOSIS — N186 End stage renal disease: Secondary | ICD-10-CM | POA: Diagnosis not present

## 2018-12-26 DIAGNOSIS — N2581 Secondary hyperparathyroidism of renal origin: Secondary | ICD-10-CM | POA: Diagnosis not present

## 2018-12-26 DIAGNOSIS — D509 Iron deficiency anemia, unspecified: Secondary | ICD-10-CM | POA: Diagnosis not present

## 2018-12-27 DIAGNOSIS — D631 Anemia in chronic kidney disease: Secondary | ICD-10-CM | POA: Diagnosis not present

## 2018-12-27 DIAGNOSIS — D509 Iron deficiency anemia, unspecified: Secondary | ICD-10-CM | POA: Diagnosis not present

## 2018-12-27 DIAGNOSIS — N186 End stage renal disease: Secondary | ICD-10-CM | POA: Diagnosis not present

## 2018-12-27 DIAGNOSIS — N2581 Secondary hyperparathyroidism of renal origin: Secondary | ICD-10-CM | POA: Diagnosis not present

## 2018-12-28 DIAGNOSIS — N186 End stage renal disease: Secondary | ICD-10-CM | POA: Diagnosis not present

## 2018-12-28 DIAGNOSIS — D631 Anemia in chronic kidney disease: Secondary | ICD-10-CM | POA: Diagnosis not present

## 2018-12-28 DIAGNOSIS — D509 Iron deficiency anemia, unspecified: Secondary | ICD-10-CM | POA: Diagnosis not present

## 2018-12-28 DIAGNOSIS — N2581 Secondary hyperparathyroidism of renal origin: Secondary | ICD-10-CM | POA: Diagnosis not present

## 2018-12-29 DIAGNOSIS — N2581 Secondary hyperparathyroidism of renal origin: Secondary | ICD-10-CM | POA: Diagnosis not present

## 2018-12-29 DIAGNOSIS — D509 Iron deficiency anemia, unspecified: Secondary | ICD-10-CM | POA: Diagnosis not present

## 2018-12-29 DIAGNOSIS — D631 Anemia in chronic kidney disease: Secondary | ICD-10-CM | POA: Diagnosis not present

## 2018-12-29 DIAGNOSIS — N186 End stage renal disease: Secondary | ICD-10-CM | POA: Diagnosis not present

## 2018-12-30 DIAGNOSIS — N186 End stage renal disease: Secondary | ICD-10-CM | POA: Diagnosis not present

## 2018-12-30 DIAGNOSIS — D631 Anemia in chronic kidney disease: Secondary | ICD-10-CM | POA: Diagnosis not present

## 2018-12-30 DIAGNOSIS — N2581 Secondary hyperparathyroidism of renal origin: Secondary | ICD-10-CM | POA: Diagnosis not present

## 2018-12-30 DIAGNOSIS — D509 Iron deficiency anemia, unspecified: Secondary | ICD-10-CM | POA: Diagnosis not present

## 2018-12-31 DIAGNOSIS — D631 Anemia in chronic kidney disease: Secondary | ICD-10-CM | POA: Diagnosis not present

## 2018-12-31 DIAGNOSIS — D509 Iron deficiency anemia, unspecified: Secondary | ICD-10-CM | POA: Diagnosis not present

## 2018-12-31 DIAGNOSIS — N2581 Secondary hyperparathyroidism of renal origin: Secondary | ICD-10-CM | POA: Diagnosis not present

## 2018-12-31 DIAGNOSIS — N186 End stage renal disease: Secondary | ICD-10-CM | POA: Diagnosis not present

## 2019-01-01 DIAGNOSIS — N2581 Secondary hyperparathyroidism of renal origin: Secondary | ICD-10-CM | POA: Diagnosis not present

## 2019-01-01 DIAGNOSIS — N186 End stage renal disease: Secondary | ICD-10-CM | POA: Diagnosis not present

## 2019-01-01 DIAGNOSIS — D631 Anemia in chronic kidney disease: Secondary | ICD-10-CM | POA: Diagnosis not present

## 2019-01-01 DIAGNOSIS — D509 Iron deficiency anemia, unspecified: Secondary | ICD-10-CM | POA: Diagnosis not present

## 2019-01-02 DIAGNOSIS — N186 End stage renal disease: Secondary | ICD-10-CM | POA: Diagnosis not present

## 2019-01-02 DIAGNOSIS — D509 Iron deficiency anemia, unspecified: Secondary | ICD-10-CM | POA: Diagnosis not present

## 2019-01-02 DIAGNOSIS — N2581 Secondary hyperparathyroidism of renal origin: Secondary | ICD-10-CM | POA: Diagnosis not present

## 2019-01-02 DIAGNOSIS — D631 Anemia in chronic kidney disease: Secondary | ICD-10-CM | POA: Diagnosis not present

## 2019-01-03 DIAGNOSIS — E118 Type 2 diabetes mellitus with unspecified complications: Secondary | ICD-10-CM | POA: Diagnosis not present

## 2019-01-03 DIAGNOSIS — N2581 Secondary hyperparathyroidism of renal origin: Secondary | ICD-10-CM | POA: Diagnosis not present

## 2019-01-03 DIAGNOSIS — D631 Anemia in chronic kidney disease: Secondary | ICD-10-CM | POA: Diagnosis not present

## 2019-01-03 DIAGNOSIS — D509 Iron deficiency anemia, unspecified: Secondary | ICD-10-CM | POA: Diagnosis not present

## 2019-01-03 DIAGNOSIS — N186 End stage renal disease: Secondary | ICD-10-CM | POA: Diagnosis not present

## 2019-01-04 DIAGNOSIS — N2581 Secondary hyperparathyroidism of renal origin: Secondary | ICD-10-CM | POA: Diagnosis not present

## 2019-01-04 DIAGNOSIS — N186 End stage renal disease: Secondary | ICD-10-CM | POA: Diagnosis not present

## 2019-01-04 DIAGNOSIS — D509 Iron deficiency anemia, unspecified: Secondary | ICD-10-CM | POA: Diagnosis not present

## 2019-01-04 DIAGNOSIS — D631 Anemia in chronic kidney disease: Secondary | ICD-10-CM | POA: Diagnosis not present

## 2019-01-05 DIAGNOSIS — N2581 Secondary hyperparathyroidism of renal origin: Secondary | ICD-10-CM | POA: Diagnosis not present

## 2019-01-05 DIAGNOSIS — D509 Iron deficiency anemia, unspecified: Secondary | ICD-10-CM | POA: Diagnosis not present

## 2019-01-05 DIAGNOSIS — N186 End stage renal disease: Secondary | ICD-10-CM | POA: Diagnosis not present

## 2019-01-05 DIAGNOSIS — D631 Anemia in chronic kidney disease: Secondary | ICD-10-CM | POA: Diagnosis not present

## 2019-01-06 DIAGNOSIS — N2581 Secondary hyperparathyroidism of renal origin: Secondary | ICD-10-CM | POA: Diagnosis not present

## 2019-01-06 DIAGNOSIS — N186 End stage renal disease: Secondary | ICD-10-CM | POA: Diagnosis not present

## 2019-01-06 DIAGNOSIS — D631 Anemia in chronic kidney disease: Secondary | ICD-10-CM | POA: Diagnosis not present

## 2019-01-06 DIAGNOSIS — D509 Iron deficiency anemia, unspecified: Secondary | ICD-10-CM | POA: Diagnosis not present

## 2019-01-07 DIAGNOSIS — N2581 Secondary hyperparathyroidism of renal origin: Secondary | ICD-10-CM | POA: Diagnosis not present

## 2019-01-07 DIAGNOSIS — N186 End stage renal disease: Secondary | ICD-10-CM | POA: Diagnosis not present

## 2019-01-07 DIAGNOSIS — D509 Iron deficiency anemia, unspecified: Secondary | ICD-10-CM | POA: Diagnosis not present

## 2019-01-07 DIAGNOSIS — D631 Anemia in chronic kidney disease: Secondary | ICD-10-CM | POA: Diagnosis not present

## 2019-01-08 DIAGNOSIS — N2581 Secondary hyperparathyroidism of renal origin: Secondary | ICD-10-CM | POA: Diagnosis not present

## 2019-01-08 DIAGNOSIS — D631 Anemia in chronic kidney disease: Secondary | ICD-10-CM | POA: Diagnosis not present

## 2019-01-08 DIAGNOSIS — D509 Iron deficiency anemia, unspecified: Secondary | ICD-10-CM | POA: Diagnosis not present

## 2019-01-08 DIAGNOSIS — N186 End stage renal disease: Secondary | ICD-10-CM | POA: Diagnosis not present

## 2019-01-09 DIAGNOSIS — N186 End stage renal disease: Secondary | ICD-10-CM | POA: Diagnosis not present

## 2019-01-09 DIAGNOSIS — N2581 Secondary hyperparathyroidism of renal origin: Secondary | ICD-10-CM | POA: Diagnosis not present

## 2019-01-09 DIAGNOSIS — D631 Anemia in chronic kidney disease: Secondary | ICD-10-CM | POA: Diagnosis not present

## 2019-01-09 DIAGNOSIS — D509 Iron deficiency anemia, unspecified: Secondary | ICD-10-CM | POA: Diagnosis not present

## 2019-01-10 DIAGNOSIS — N2581 Secondary hyperparathyroidism of renal origin: Secondary | ICD-10-CM | POA: Diagnosis not present

## 2019-01-10 DIAGNOSIS — D509 Iron deficiency anemia, unspecified: Secondary | ICD-10-CM | POA: Diagnosis not present

## 2019-01-10 DIAGNOSIS — N186 End stage renal disease: Secondary | ICD-10-CM | POA: Diagnosis not present

## 2019-01-10 DIAGNOSIS — D631 Anemia in chronic kidney disease: Secondary | ICD-10-CM | POA: Diagnosis not present

## 2019-01-11 DIAGNOSIS — N186 End stage renal disease: Secondary | ICD-10-CM | POA: Diagnosis not present

## 2019-01-11 DIAGNOSIS — D631 Anemia in chronic kidney disease: Secondary | ICD-10-CM | POA: Diagnosis not present

## 2019-01-11 DIAGNOSIS — N2581 Secondary hyperparathyroidism of renal origin: Secondary | ICD-10-CM | POA: Diagnosis not present

## 2019-01-11 DIAGNOSIS — D509 Iron deficiency anemia, unspecified: Secondary | ICD-10-CM | POA: Diagnosis not present

## 2019-01-12 DIAGNOSIS — N186 End stage renal disease: Secondary | ICD-10-CM | POA: Diagnosis not present

## 2019-01-12 DIAGNOSIS — D631 Anemia in chronic kidney disease: Secondary | ICD-10-CM | POA: Diagnosis not present

## 2019-01-12 DIAGNOSIS — N2581 Secondary hyperparathyroidism of renal origin: Secondary | ICD-10-CM | POA: Diagnosis not present

## 2019-01-12 DIAGNOSIS — D509 Iron deficiency anemia, unspecified: Secondary | ICD-10-CM | POA: Diagnosis not present

## 2019-01-13 DIAGNOSIS — N2581 Secondary hyperparathyroidism of renal origin: Secondary | ICD-10-CM | POA: Diagnosis not present

## 2019-01-13 DIAGNOSIS — D631 Anemia in chronic kidney disease: Secondary | ICD-10-CM | POA: Diagnosis not present

## 2019-01-13 DIAGNOSIS — D509 Iron deficiency anemia, unspecified: Secondary | ICD-10-CM | POA: Diagnosis not present

## 2019-01-13 DIAGNOSIS — N186 End stage renal disease: Secondary | ICD-10-CM | POA: Diagnosis not present

## 2019-01-14 DIAGNOSIS — D509 Iron deficiency anemia, unspecified: Secondary | ICD-10-CM | POA: Diagnosis not present

## 2019-01-14 DIAGNOSIS — D631 Anemia in chronic kidney disease: Secondary | ICD-10-CM | POA: Diagnosis not present

## 2019-01-14 DIAGNOSIS — N2581 Secondary hyperparathyroidism of renal origin: Secondary | ICD-10-CM | POA: Diagnosis not present

## 2019-01-14 DIAGNOSIS — N186 End stage renal disease: Secondary | ICD-10-CM | POA: Diagnosis not present

## 2019-01-15 DIAGNOSIS — N2581 Secondary hyperparathyroidism of renal origin: Secondary | ICD-10-CM | POA: Diagnosis not present

## 2019-01-15 DIAGNOSIS — D631 Anemia in chronic kidney disease: Secondary | ICD-10-CM | POA: Diagnosis not present

## 2019-01-15 DIAGNOSIS — N186 End stage renal disease: Secondary | ICD-10-CM | POA: Diagnosis not present

## 2019-01-15 DIAGNOSIS — D509 Iron deficiency anemia, unspecified: Secondary | ICD-10-CM | POA: Diagnosis not present

## 2019-01-16 DIAGNOSIS — D631 Anemia in chronic kidney disease: Secondary | ICD-10-CM | POA: Diagnosis not present

## 2019-01-16 DIAGNOSIS — N186 End stage renal disease: Secondary | ICD-10-CM | POA: Diagnosis not present

## 2019-01-16 DIAGNOSIS — D509 Iron deficiency anemia, unspecified: Secondary | ICD-10-CM | POA: Diagnosis not present

## 2019-01-16 DIAGNOSIS — N2581 Secondary hyperparathyroidism of renal origin: Secondary | ICD-10-CM | POA: Diagnosis not present

## 2019-01-17 DIAGNOSIS — D631 Anemia in chronic kidney disease: Secondary | ICD-10-CM | POA: Diagnosis not present

## 2019-01-17 DIAGNOSIS — N186 End stage renal disease: Secondary | ICD-10-CM | POA: Diagnosis not present

## 2019-01-17 DIAGNOSIS — D509 Iron deficiency anemia, unspecified: Secondary | ICD-10-CM | POA: Diagnosis not present

## 2019-01-17 DIAGNOSIS — N2581 Secondary hyperparathyroidism of renal origin: Secondary | ICD-10-CM | POA: Diagnosis not present

## 2019-01-18 DIAGNOSIS — D509 Iron deficiency anemia, unspecified: Secondary | ICD-10-CM | POA: Diagnosis not present

## 2019-01-18 DIAGNOSIS — N2581 Secondary hyperparathyroidism of renal origin: Secondary | ICD-10-CM | POA: Diagnosis not present

## 2019-01-18 DIAGNOSIS — D631 Anemia in chronic kidney disease: Secondary | ICD-10-CM | POA: Diagnosis not present

## 2019-01-18 DIAGNOSIS — N186 End stage renal disease: Secondary | ICD-10-CM | POA: Diagnosis not present

## 2019-01-19 DIAGNOSIS — D631 Anemia in chronic kidney disease: Secondary | ICD-10-CM | POA: Diagnosis not present

## 2019-01-19 DIAGNOSIS — D509 Iron deficiency anemia, unspecified: Secondary | ICD-10-CM | POA: Diagnosis not present

## 2019-01-19 DIAGNOSIS — N2581 Secondary hyperparathyroidism of renal origin: Secondary | ICD-10-CM | POA: Diagnosis not present

## 2019-01-19 DIAGNOSIS — N186 End stage renal disease: Secondary | ICD-10-CM | POA: Diagnosis not present

## 2019-01-20 DIAGNOSIS — D631 Anemia in chronic kidney disease: Secondary | ICD-10-CM | POA: Diagnosis not present

## 2019-01-20 DIAGNOSIS — N2581 Secondary hyperparathyroidism of renal origin: Secondary | ICD-10-CM | POA: Diagnosis not present

## 2019-01-20 DIAGNOSIS — N186 End stage renal disease: Secondary | ICD-10-CM | POA: Diagnosis not present

## 2019-01-20 DIAGNOSIS — D509 Iron deficiency anemia, unspecified: Secondary | ICD-10-CM | POA: Diagnosis not present

## 2019-01-21 DIAGNOSIS — D631 Anemia in chronic kidney disease: Secondary | ICD-10-CM | POA: Diagnosis not present

## 2019-01-21 DIAGNOSIS — N186 End stage renal disease: Secondary | ICD-10-CM | POA: Diagnosis not present

## 2019-01-21 DIAGNOSIS — D509 Iron deficiency anemia, unspecified: Secondary | ICD-10-CM | POA: Diagnosis not present

## 2019-01-21 DIAGNOSIS — N2581 Secondary hyperparathyroidism of renal origin: Secondary | ICD-10-CM | POA: Diagnosis not present

## 2019-01-22 DIAGNOSIS — D509 Iron deficiency anemia, unspecified: Secondary | ICD-10-CM | POA: Diagnosis not present

## 2019-01-22 DIAGNOSIS — N2581 Secondary hyperparathyroidism of renal origin: Secondary | ICD-10-CM | POA: Diagnosis not present

## 2019-01-22 DIAGNOSIS — D631 Anemia in chronic kidney disease: Secondary | ICD-10-CM | POA: Diagnosis not present

## 2019-01-22 DIAGNOSIS — N186 End stage renal disease: Secondary | ICD-10-CM | POA: Diagnosis not present

## 2019-01-23 DIAGNOSIS — N2581 Secondary hyperparathyroidism of renal origin: Secondary | ICD-10-CM | POA: Diagnosis not present

## 2019-01-23 DIAGNOSIS — D631 Anemia in chronic kidney disease: Secondary | ICD-10-CM | POA: Diagnosis not present

## 2019-01-23 DIAGNOSIS — N186 End stage renal disease: Secondary | ICD-10-CM | POA: Diagnosis not present

## 2019-01-23 DIAGNOSIS — D509 Iron deficiency anemia, unspecified: Secondary | ICD-10-CM | POA: Diagnosis not present

## 2019-01-24 DIAGNOSIS — Z992 Dependence on renal dialysis: Secondary | ICD-10-CM | POA: Diagnosis not present

## 2019-01-24 DIAGNOSIS — D509 Iron deficiency anemia, unspecified: Secondary | ICD-10-CM | POA: Diagnosis not present

## 2019-01-24 DIAGNOSIS — N186 End stage renal disease: Secondary | ICD-10-CM | POA: Diagnosis not present

## 2019-01-24 DIAGNOSIS — D631 Anemia in chronic kidney disease: Secondary | ICD-10-CM | POA: Diagnosis not present

## 2019-01-24 DIAGNOSIS — N2581 Secondary hyperparathyroidism of renal origin: Secondary | ICD-10-CM | POA: Diagnosis not present

## 2019-02-25 DIAGNOSIS — N186 End stage renal disease: Secondary | ICD-10-CM | POA: Diagnosis not present

## 2019-02-25 DIAGNOSIS — N2581 Secondary hyperparathyroidism of renal origin: Secondary | ICD-10-CM | POA: Diagnosis not present

## 2019-02-25 DIAGNOSIS — D631 Anemia in chronic kidney disease: Secondary | ICD-10-CM | POA: Diagnosis not present

## 2019-02-26 DIAGNOSIS — N2581 Secondary hyperparathyroidism of renal origin: Secondary | ICD-10-CM | POA: Diagnosis not present

## 2019-02-26 DIAGNOSIS — D631 Anemia in chronic kidney disease: Secondary | ICD-10-CM | POA: Diagnosis not present

## 2019-02-26 DIAGNOSIS — N186 End stage renal disease: Secondary | ICD-10-CM | POA: Diagnosis not present

## 2019-02-27 DIAGNOSIS — D631 Anemia in chronic kidney disease: Secondary | ICD-10-CM | POA: Diagnosis not present

## 2019-02-27 DIAGNOSIS — N2581 Secondary hyperparathyroidism of renal origin: Secondary | ICD-10-CM | POA: Diagnosis not present

## 2019-02-27 DIAGNOSIS — N186 End stage renal disease: Secondary | ICD-10-CM | POA: Diagnosis not present

## 2019-02-28 DIAGNOSIS — N186 End stage renal disease: Secondary | ICD-10-CM | POA: Diagnosis not present

## 2019-02-28 DIAGNOSIS — D631 Anemia in chronic kidney disease: Secondary | ICD-10-CM | POA: Diagnosis not present

## 2019-02-28 DIAGNOSIS — N2581 Secondary hyperparathyroidism of renal origin: Secondary | ICD-10-CM | POA: Diagnosis not present

## 2019-03-01 DIAGNOSIS — N2581 Secondary hyperparathyroidism of renal origin: Secondary | ICD-10-CM | POA: Diagnosis not present

## 2019-03-01 DIAGNOSIS — D631 Anemia in chronic kidney disease: Secondary | ICD-10-CM | POA: Diagnosis not present

## 2019-03-01 DIAGNOSIS — N186 End stage renal disease: Secondary | ICD-10-CM | POA: Diagnosis not present

## 2019-03-02 DIAGNOSIS — N2581 Secondary hyperparathyroidism of renal origin: Secondary | ICD-10-CM | POA: Diagnosis not present

## 2019-03-02 DIAGNOSIS — N186 End stage renal disease: Secondary | ICD-10-CM | POA: Diagnosis not present

## 2019-03-02 DIAGNOSIS — D631 Anemia in chronic kidney disease: Secondary | ICD-10-CM | POA: Diagnosis not present

## 2019-03-03 DIAGNOSIS — N2581 Secondary hyperparathyroidism of renal origin: Secondary | ICD-10-CM | POA: Diagnosis not present

## 2019-03-03 DIAGNOSIS — N186 End stage renal disease: Secondary | ICD-10-CM | POA: Diagnosis not present

## 2019-03-03 DIAGNOSIS — D631 Anemia in chronic kidney disease: Secondary | ICD-10-CM | POA: Diagnosis not present

## 2019-03-04 DIAGNOSIS — N2581 Secondary hyperparathyroidism of renal origin: Secondary | ICD-10-CM | POA: Diagnosis not present

## 2019-03-04 DIAGNOSIS — D631 Anemia in chronic kidney disease: Secondary | ICD-10-CM | POA: Diagnosis not present

## 2019-03-04 DIAGNOSIS — N186 End stage renal disease: Secondary | ICD-10-CM | POA: Diagnosis not present

## 2019-03-05 DIAGNOSIS — D631 Anemia in chronic kidney disease: Secondary | ICD-10-CM | POA: Diagnosis not present

## 2019-03-05 DIAGNOSIS — N2581 Secondary hyperparathyroidism of renal origin: Secondary | ICD-10-CM | POA: Diagnosis not present

## 2019-03-05 DIAGNOSIS — N186 End stage renal disease: Secondary | ICD-10-CM | POA: Diagnosis not present

## 2019-03-06 DIAGNOSIS — D631 Anemia in chronic kidney disease: Secondary | ICD-10-CM | POA: Diagnosis not present

## 2019-03-06 DIAGNOSIS — N186 End stage renal disease: Secondary | ICD-10-CM | POA: Diagnosis not present

## 2019-03-06 DIAGNOSIS — N2581 Secondary hyperparathyroidism of renal origin: Secondary | ICD-10-CM | POA: Diagnosis not present

## 2019-03-07 DIAGNOSIS — N2581 Secondary hyperparathyroidism of renal origin: Secondary | ICD-10-CM | POA: Diagnosis not present

## 2019-03-07 DIAGNOSIS — E118 Type 2 diabetes mellitus with unspecified complications: Secondary | ICD-10-CM | POA: Diagnosis not present

## 2019-03-07 DIAGNOSIS — Z4932 Encounter for adequacy testing for peritoneal dialysis: Secondary | ICD-10-CM | POA: Diagnosis not present

## 2019-03-07 DIAGNOSIS — D631 Anemia in chronic kidney disease: Secondary | ICD-10-CM | POA: Diagnosis not present

## 2019-03-07 DIAGNOSIS — N186 End stage renal disease: Secondary | ICD-10-CM | POA: Diagnosis not present

## 2019-03-08 DIAGNOSIS — N186 End stage renal disease: Secondary | ICD-10-CM | POA: Diagnosis not present

## 2019-03-08 DIAGNOSIS — N2581 Secondary hyperparathyroidism of renal origin: Secondary | ICD-10-CM | POA: Diagnosis not present

## 2019-03-08 DIAGNOSIS — D631 Anemia in chronic kidney disease: Secondary | ICD-10-CM | POA: Diagnosis not present

## 2019-03-09 DIAGNOSIS — N186 End stage renal disease: Secondary | ICD-10-CM | POA: Diagnosis not present

## 2019-03-09 DIAGNOSIS — N2581 Secondary hyperparathyroidism of renal origin: Secondary | ICD-10-CM | POA: Diagnosis not present

## 2019-03-09 DIAGNOSIS — D631 Anemia in chronic kidney disease: Secondary | ICD-10-CM | POA: Diagnosis not present

## 2019-03-10 DIAGNOSIS — N2581 Secondary hyperparathyroidism of renal origin: Secondary | ICD-10-CM | POA: Diagnosis not present

## 2019-03-10 DIAGNOSIS — N186 End stage renal disease: Secondary | ICD-10-CM | POA: Diagnosis not present

## 2019-03-10 DIAGNOSIS — D631 Anemia in chronic kidney disease: Secondary | ICD-10-CM | POA: Diagnosis not present

## 2019-03-11 DIAGNOSIS — N2581 Secondary hyperparathyroidism of renal origin: Secondary | ICD-10-CM | POA: Diagnosis not present

## 2019-03-11 DIAGNOSIS — N186 End stage renal disease: Secondary | ICD-10-CM | POA: Diagnosis not present

## 2019-03-11 DIAGNOSIS — D631 Anemia in chronic kidney disease: Secondary | ICD-10-CM | POA: Diagnosis not present

## 2019-03-12 DIAGNOSIS — N2581 Secondary hyperparathyroidism of renal origin: Secondary | ICD-10-CM | POA: Diagnosis not present

## 2019-03-12 DIAGNOSIS — N186 End stage renal disease: Secondary | ICD-10-CM | POA: Diagnosis not present

## 2019-03-12 DIAGNOSIS — D631 Anemia in chronic kidney disease: Secondary | ICD-10-CM | POA: Diagnosis not present

## 2019-03-13 DIAGNOSIS — D631 Anemia in chronic kidney disease: Secondary | ICD-10-CM | POA: Diagnosis not present

## 2019-03-13 DIAGNOSIS — N2581 Secondary hyperparathyroidism of renal origin: Secondary | ICD-10-CM | POA: Diagnosis not present

## 2019-03-13 DIAGNOSIS — N186 End stage renal disease: Secondary | ICD-10-CM | POA: Diagnosis not present

## 2019-03-14 DIAGNOSIS — N2581 Secondary hyperparathyroidism of renal origin: Secondary | ICD-10-CM | POA: Diagnosis not present

## 2019-03-14 DIAGNOSIS — D631 Anemia in chronic kidney disease: Secondary | ICD-10-CM | POA: Diagnosis not present

## 2019-03-14 DIAGNOSIS — N186 End stage renal disease: Secondary | ICD-10-CM | POA: Diagnosis not present

## 2019-03-15 DIAGNOSIS — N186 End stage renal disease: Secondary | ICD-10-CM | POA: Diagnosis not present

## 2019-03-15 DIAGNOSIS — N2581 Secondary hyperparathyroidism of renal origin: Secondary | ICD-10-CM | POA: Diagnosis not present

## 2019-03-15 DIAGNOSIS — D631 Anemia in chronic kidney disease: Secondary | ICD-10-CM | POA: Diagnosis not present

## 2019-03-16 DIAGNOSIS — N2581 Secondary hyperparathyroidism of renal origin: Secondary | ICD-10-CM | POA: Diagnosis not present

## 2019-03-16 DIAGNOSIS — D631 Anemia in chronic kidney disease: Secondary | ICD-10-CM | POA: Diagnosis not present

## 2019-03-16 DIAGNOSIS — N186 End stage renal disease: Secondary | ICD-10-CM | POA: Diagnosis not present

## 2019-03-17 DIAGNOSIS — N186 End stage renal disease: Secondary | ICD-10-CM | POA: Diagnosis not present

## 2019-03-17 DIAGNOSIS — D631 Anemia in chronic kidney disease: Secondary | ICD-10-CM | POA: Diagnosis not present

## 2019-03-17 DIAGNOSIS — N2581 Secondary hyperparathyroidism of renal origin: Secondary | ICD-10-CM | POA: Diagnosis not present

## 2019-03-18 DIAGNOSIS — N186 End stage renal disease: Secondary | ICD-10-CM | POA: Diagnosis not present

## 2019-03-18 DIAGNOSIS — N2581 Secondary hyperparathyroidism of renal origin: Secondary | ICD-10-CM | POA: Diagnosis not present

## 2019-03-18 DIAGNOSIS — D631 Anemia in chronic kidney disease: Secondary | ICD-10-CM | POA: Diagnosis not present

## 2019-03-19 DIAGNOSIS — N2581 Secondary hyperparathyroidism of renal origin: Secondary | ICD-10-CM | POA: Diagnosis not present

## 2019-03-19 DIAGNOSIS — N186 End stage renal disease: Secondary | ICD-10-CM | POA: Diagnosis not present

## 2019-03-19 DIAGNOSIS — D631 Anemia in chronic kidney disease: Secondary | ICD-10-CM | POA: Diagnosis not present

## 2019-03-20 DIAGNOSIS — D631 Anemia in chronic kidney disease: Secondary | ICD-10-CM | POA: Diagnosis not present

## 2019-03-20 DIAGNOSIS — N186 End stage renal disease: Secondary | ICD-10-CM | POA: Diagnosis not present

## 2019-03-20 DIAGNOSIS — N2581 Secondary hyperparathyroidism of renal origin: Secondary | ICD-10-CM | POA: Diagnosis not present

## 2019-03-21 DIAGNOSIS — D631 Anemia in chronic kidney disease: Secondary | ICD-10-CM | POA: Diagnosis not present

## 2019-03-21 DIAGNOSIS — N2581 Secondary hyperparathyroidism of renal origin: Secondary | ICD-10-CM | POA: Diagnosis not present

## 2019-03-21 DIAGNOSIS — N186 End stage renal disease: Secondary | ICD-10-CM | POA: Diagnosis not present

## 2019-03-22 DIAGNOSIS — N186 End stage renal disease: Secondary | ICD-10-CM | POA: Diagnosis not present

## 2019-03-22 DIAGNOSIS — N2581 Secondary hyperparathyroidism of renal origin: Secondary | ICD-10-CM | POA: Diagnosis not present

## 2019-03-22 DIAGNOSIS — D631 Anemia in chronic kidney disease: Secondary | ICD-10-CM | POA: Diagnosis not present

## 2019-03-23 DIAGNOSIS — N186 End stage renal disease: Secondary | ICD-10-CM | POA: Diagnosis not present

## 2019-03-23 DIAGNOSIS — N2581 Secondary hyperparathyroidism of renal origin: Secondary | ICD-10-CM | POA: Diagnosis not present

## 2019-03-23 DIAGNOSIS — D631 Anemia in chronic kidney disease: Secondary | ICD-10-CM | POA: Diagnosis not present

## 2019-03-24 DIAGNOSIS — D631 Anemia in chronic kidney disease: Secondary | ICD-10-CM | POA: Diagnosis not present

## 2019-03-24 DIAGNOSIS — N186 End stage renal disease: Secondary | ICD-10-CM | POA: Diagnosis not present

## 2019-03-24 DIAGNOSIS — N2581 Secondary hyperparathyroidism of renal origin: Secondary | ICD-10-CM | POA: Diagnosis not present

## 2019-03-25 DIAGNOSIS — N2581 Secondary hyperparathyroidism of renal origin: Secondary | ICD-10-CM | POA: Diagnosis not present

## 2019-03-25 DIAGNOSIS — D631 Anemia in chronic kidney disease: Secondary | ICD-10-CM | POA: Diagnosis not present

## 2019-03-25 DIAGNOSIS — N186 End stage renal disease: Secondary | ICD-10-CM | POA: Diagnosis not present

## 2019-03-26 DIAGNOSIS — N186 End stage renal disease: Secondary | ICD-10-CM | POA: Diagnosis not present

## 2019-03-26 DIAGNOSIS — N2581 Secondary hyperparathyroidism of renal origin: Secondary | ICD-10-CM | POA: Diagnosis not present

## 2019-03-26 DIAGNOSIS — D631 Anemia in chronic kidney disease: Secondary | ICD-10-CM | POA: Diagnosis not present

## 2019-03-27 DIAGNOSIS — N186 End stage renal disease: Secondary | ICD-10-CM | POA: Diagnosis not present

## 2019-03-27 DIAGNOSIS — D631 Anemia in chronic kidney disease: Secondary | ICD-10-CM | POA: Diagnosis not present

## 2019-03-27 DIAGNOSIS — N2581 Secondary hyperparathyroidism of renal origin: Secondary | ICD-10-CM | POA: Diagnosis not present

## 2019-03-27 DIAGNOSIS — Z992 Dependence on renal dialysis: Secondary | ICD-10-CM | POA: Diagnosis not present

## 2019-03-28 DIAGNOSIS — N2581 Secondary hyperparathyroidism of renal origin: Secondary | ICD-10-CM | POA: Diagnosis not present

## 2019-03-28 DIAGNOSIS — D631 Anemia in chronic kidney disease: Secondary | ICD-10-CM | POA: Diagnosis not present

## 2019-03-28 DIAGNOSIS — N186 End stage renal disease: Secondary | ICD-10-CM | POA: Diagnosis not present

## 2019-03-28 DIAGNOSIS — Z23 Encounter for immunization: Secondary | ICD-10-CM | POA: Diagnosis not present

## 2019-03-29 DIAGNOSIS — Z23 Encounter for immunization: Secondary | ICD-10-CM | POA: Diagnosis not present

## 2019-03-29 DIAGNOSIS — N2581 Secondary hyperparathyroidism of renal origin: Secondary | ICD-10-CM | POA: Diagnosis not present

## 2019-03-29 DIAGNOSIS — N186 End stage renal disease: Secondary | ICD-10-CM | POA: Diagnosis not present

## 2019-03-29 DIAGNOSIS — D631 Anemia in chronic kidney disease: Secondary | ICD-10-CM | POA: Diagnosis not present

## 2019-03-30 DIAGNOSIS — D631 Anemia in chronic kidney disease: Secondary | ICD-10-CM | POA: Diagnosis not present

## 2019-03-30 DIAGNOSIS — N2581 Secondary hyperparathyroidism of renal origin: Secondary | ICD-10-CM | POA: Diagnosis not present

## 2019-03-30 DIAGNOSIS — N186 End stage renal disease: Secondary | ICD-10-CM | POA: Diagnosis not present

## 2019-03-30 DIAGNOSIS — Z23 Encounter for immunization: Secondary | ICD-10-CM | POA: Diagnosis not present

## 2019-03-31 DIAGNOSIS — Z23 Encounter for immunization: Secondary | ICD-10-CM | POA: Diagnosis not present

## 2019-03-31 DIAGNOSIS — D631 Anemia in chronic kidney disease: Secondary | ICD-10-CM | POA: Diagnosis not present

## 2019-03-31 DIAGNOSIS — N186 End stage renal disease: Secondary | ICD-10-CM | POA: Diagnosis not present

## 2019-03-31 DIAGNOSIS — N2581 Secondary hyperparathyroidism of renal origin: Secondary | ICD-10-CM | POA: Diagnosis not present

## 2019-04-01 DIAGNOSIS — N186 End stage renal disease: Secondary | ICD-10-CM | POA: Diagnosis not present

## 2019-04-01 DIAGNOSIS — Z23 Encounter for immunization: Secondary | ICD-10-CM | POA: Diagnosis not present

## 2019-04-01 DIAGNOSIS — D631 Anemia in chronic kidney disease: Secondary | ICD-10-CM | POA: Diagnosis not present

## 2019-04-01 DIAGNOSIS — N2581 Secondary hyperparathyroidism of renal origin: Secondary | ICD-10-CM | POA: Diagnosis not present

## 2019-04-02 DIAGNOSIS — N2581 Secondary hyperparathyroidism of renal origin: Secondary | ICD-10-CM | POA: Diagnosis not present

## 2019-04-02 DIAGNOSIS — N186 End stage renal disease: Secondary | ICD-10-CM | POA: Diagnosis not present

## 2019-04-02 DIAGNOSIS — Z23 Encounter for immunization: Secondary | ICD-10-CM | POA: Diagnosis not present

## 2019-04-02 DIAGNOSIS — D631 Anemia in chronic kidney disease: Secondary | ICD-10-CM | POA: Diagnosis not present

## 2019-04-03 DIAGNOSIS — D631 Anemia in chronic kidney disease: Secondary | ICD-10-CM | POA: Diagnosis not present

## 2019-04-03 DIAGNOSIS — Z23 Encounter for immunization: Secondary | ICD-10-CM | POA: Diagnosis not present

## 2019-04-03 DIAGNOSIS — N186 End stage renal disease: Secondary | ICD-10-CM | POA: Diagnosis not present

## 2019-04-03 DIAGNOSIS — N2581 Secondary hyperparathyroidism of renal origin: Secondary | ICD-10-CM | POA: Diagnosis not present

## 2019-04-04 DIAGNOSIS — N2581 Secondary hyperparathyroidism of renal origin: Secondary | ICD-10-CM | POA: Diagnosis not present

## 2019-04-04 DIAGNOSIS — D631 Anemia in chronic kidney disease: Secondary | ICD-10-CM | POA: Diagnosis not present

## 2019-04-04 DIAGNOSIS — Z23 Encounter for immunization: Secondary | ICD-10-CM | POA: Diagnosis not present

## 2019-04-04 DIAGNOSIS — E118 Type 2 diabetes mellitus with unspecified complications: Secondary | ICD-10-CM | POA: Diagnosis not present

## 2019-04-04 DIAGNOSIS — N186 End stage renal disease: Secondary | ICD-10-CM | POA: Diagnosis not present

## 2019-04-05 ENCOUNTER — Other Ambulatory Visit: Payer: Self-pay

## 2019-04-05 ENCOUNTER — Ambulatory Visit (INDEPENDENT_AMBULATORY_CARE_PROVIDER_SITE_OTHER): Payer: Medicare Other | Admitting: Sports Medicine

## 2019-04-05 ENCOUNTER — Encounter: Payer: Self-pay | Admitting: Sports Medicine

## 2019-04-05 DIAGNOSIS — N186 End stage renal disease: Secondary | ICD-10-CM | POA: Diagnosis not present

## 2019-04-05 DIAGNOSIS — E119 Type 2 diabetes mellitus without complications: Secondary | ICD-10-CM

## 2019-04-05 DIAGNOSIS — Z23 Encounter for immunization: Secondary | ICD-10-CM | POA: Diagnosis not present

## 2019-04-05 DIAGNOSIS — I10 Essential (primary) hypertension: Secondary | ICD-10-CM | POA: Diagnosis not present

## 2019-04-05 DIAGNOSIS — H5711 Ocular pain, right eye: Secondary | ICD-10-CM

## 2019-04-05 DIAGNOSIS — D631 Anemia in chronic kidney disease: Secondary | ICD-10-CM | POA: Diagnosis not present

## 2019-04-05 DIAGNOSIS — N2581 Secondary hyperparathyroidism of renal origin: Secondary | ICD-10-CM | POA: Diagnosis not present

## 2019-04-05 NOTE — Assessment & Plan Note (Signed)
Exam is overall unremarkable with the exception of some cataracts, funduscopic exam shows clear disc margins. He has good extraocular motion. No conjunctival injection, anterior chamber chemosis or hyphema. He tells me his vision is grossly unchanged from baseline. I would like him to see Dr. Gwenevere Ghazi tomorrow.

## 2019-04-05 NOTE — Progress Notes (Signed)
    Procedures performed today:    None.  Independent interpretation of tests performed by another provider:   None.  Impression and Recommendations:    Essential hypertension, benign Brian Rose tells me his blood pressure usually runs normal, he missed a few doses of his medications and it was elevated, he also developed some pain in his right eye, mild, without acute vision changes. He has been getting refills from his nephrologist, we are going to go ahead and check his routine labs. No changes in medication dosages, at home it runs in the 0000000 systolic.  Acute right eye pain Exam is overall unremarkable with the exception of some cataracts, funduscopic exam shows clear disc margins. He has good extraocular motion. No conjunctival injection, anterior chamber chemosis or hyphema. He tells me his vision is grossly unchanged from baseline. I would like him to see Dr. Gwenevere Rose tomorrow.  Diabetes mellitus type 2 in nonobese Checking hemoglobin A1c.    ___________________________________________ Brian Rose. Brian Rose, M.D., ABFM., CAQSM. Primary Care and [place] Instructor of [place] of [facility] of Medicine

## 2019-04-05 NOTE — Assessment & Plan Note (Signed)
Checking hemoglobin A1c. 

## 2019-04-05 NOTE — Assessment & Plan Note (Signed)
Brian Rose tells me his blood pressure usually runs normal, he missed a few doses of his medications and it was elevated, he also developed some pain in his right eye, mild, without acute vision changes. He has been getting refills from his nephrologist, we are going to go ahead and check his routine labs. No changes in medication dosages, at home it runs in the 0000000 systolic.

## 2019-04-06 DIAGNOSIS — N2581 Secondary hyperparathyroidism of renal origin: Secondary | ICD-10-CM | POA: Diagnosis not present

## 2019-04-06 DIAGNOSIS — Z23 Encounter for immunization: Secondary | ICD-10-CM | POA: Diagnosis not present

## 2019-04-06 DIAGNOSIS — N186 End stage renal disease: Secondary | ICD-10-CM | POA: Diagnosis not present

## 2019-04-06 DIAGNOSIS — D631 Anemia in chronic kidney disease: Secondary | ICD-10-CM | POA: Diagnosis not present

## 2019-04-07 DIAGNOSIS — N186 End stage renal disease: Secondary | ICD-10-CM | POA: Diagnosis not present

## 2019-04-07 DIAGNOSIS — Z23 Encounter for immunization: Secondary | ICD-10-CM | POA: Diagnosis not present

## 2019-04-07 DIAGNOSIS — N2581 Secondary hyperparathyroidism of renal origin: Secondary | ICD-10-CM | POA: Diagnosis not present

## 2019-04-07 DIAGNOSIS — D631 Anemia in chronic kidney disease: Secondary | ICD-10-CM | POA: Diagnosis not present

## 2019-04-08 DIAGNOSIS — N186 End stage renal disease: Secondary | ICD-10-CM | POA: Diagnosis not present

## 2019-04-08 DIAGNOSIS — D631 Anemia in chronic kidney disease: Secondary | ICD-10-CM | POA: Diagnosis not present

## 2019-04-08 DIAGNOSIS — N2581 Secondary hyperparathyroidism of renal origin: Secondary | ICD-10-CM | POA: Diagnosis not present

## 2019-04-08 DIAGNOSIS — Z23 Encounter for immunization: Secondary | ICD-10-CM | POA: Diagnosis not present

## 2019-04-09 DIAGNOSIS — N2581 Secondary hyperparathyroidism of renal origin: Secondary | ICD-10-CM | POA: Diagnosis not present

## 2019-04-09 DIAGNOSIS — N186 End stage renal disease: Secondary | ICD-10-CM | POA: Diagnosis not present

## 2019-04-09 DIAGNOSIS — Z23 Encounter for immunization: Secondary | ICD-10-CM | POA: Diagnosis not present

## 2019-04-09 DIAGNOSIS — D631 Anemia in chronic kidney disease: Secondary | ICD-10-CM | POA: Diagnosis not present

## 2019-04-10 DIAGNOSIS — D631 Anemia in chronic kidney disease: Secondary | ICD-10-CM | POA: Diagnosis not present

## 2019-04-10 DIAGNOSIS — N186 End stage renal disease: Secondary | ICD-10-CM | POA: Diagnosis not present

## 2019-04-10 DIAGNOSIS — Z23 Encounter for immunization: Secondary | ICD-10-CM | POA: Diagnosis not present

## 2019-04-10 DIAGNOSIS — N2581 Secondary hyperparathyroidism of renal origin: Secondary | ICD-10-CM | POA: Diagnosis not present

## 2019-04-11 DIAGNOSIS — N186 End stage renal disease: Secondary | ICD-10-CM | POA: Diagnosis not present

## 2019-04-11 DIAGNOSIS — N2581 Secondary hyperparathyroidism of renal origin: Secondary | ICD-10-CM | POA: Diagnosis not present

## 2019-04-11 DIAGNOSIS — Z23 Encounter for immunization: Secondary | ICD-10-CM | POA: Diagnosis not present

## 2019-04-11 DIAGNOSIS — D631 Anemia in chronic kidney disease: Secondary | ICD-10-CM | POA: Diagnosis not present

## 2019-04-12 DIAGNOSIS — N186 End stage renal disease: Secondary | ICD-10-CM | POA: Diagnosis not present

## 2019-04-12 DIAGNOSIS — Z23 Encounter for immunization: Secondary | ICD-10-CM | POA: Diagnosis not present

## 2019-04-12 DIAGNOSIS — D631 Anemia in chronic kidney disease: Secondary | ICD-10-CM | POA: Diagnosis not present

## 2019-04-12 DIAGNOSIS — N2581 Secondary hyperparathyroidism of renal origin: Secondary | ICD-10-CM | POA: Diagnosis not present

## 2019-04-13 DIAGNOSIS — Z23 Encounter for immunization: Secondary | ICD-10-CM | POA: Diagnosis not present

## 2019-04-13 DIAGNOSIS — N2581 Secondary hyperparathyroidism of renal origin: Secondary | ICD-10-CM | POA: Diagnosis not present

## 2019-04-13 DIAGNOSIS — D631 Anemia in chronic kidney disease: Secondary | ICD-10-CM | POA: Diagnosis not present

## 2019-04-13 DIAGNOSIS — N186 End stage renal disease: Secondary | ICD-10-CM | POA: Diagnosis not present

## 2019-04-14 DIAGNOSIS — N2581 Secondary hyperparathyroidism of renal origin: Secondary | ICD-10-CM | POA: Diagnosis not present

## 2019-04-14 DIAGNOSIS — N186 End stage renal disease: Secondary | ICD-10-CM | POA: Diagnosis not present

## 2019-04-14 DIAGNOSIS — D631 Anemia in chronic kidney disease: Secondary | ICD-10-CM | POA: Diagnosis not present

## 2019-04-14 DIAGNOSIS — Z23 Encounter for immunization: Secondary | ICD-10-CM | POA: Diagnosis not present

## 2019-04-15 DIAGNOSIS — N186 End stage renal disease: Secondary | ICD-10-CM | POA: Diagnosis not present

## 2019-04-15 DIAGNOSIS — Z23 Encounter for immunization: Secondary | ICD-10-CM | POA: Diagnosis not present

## 2019-04-15 DIAGNOSIS — N2581 Secondary hyperparathyroidism of renal origin: Secondary | ICD-10-CM | POA: Diagnosis not present

## 2019-04-15 DIAGNOSIS — D631 Anemia in chronic kidney disease: Secondary | ICD-10-CM | POA: Diagnosis not present

## 2019-04-16 DIAGNOSIS — D631 Anemia in chronic kidney disease: Secondary | ICD-10-CM | POA: Diagnosis not present

## 2019-04-16 DIAGNOSIS — N186 End stage renal disease: Secondary | ICD-10-CM | POA: Diagnosis not present

## 2019-04-16 DIAGNOSIS — N2581 Secondary hyperparathyroidism of renal origin: Secondary | ICD-10-CM | POA: Diagnosis not present

## 2019-04-16 DIAGNOSIS — Z23 Encounter for immunization: Secondary | ICD-10-CM | POA: Diagnosis not present

## 2019-04-17 DIAGNOSIS — N2581 Secondary hyperparathyroidism of renal origin: Secondary | ICD-10-CM | POA: Diagnosis not present

## 2019-04-17 DIAGNOSIS — D631 Anemia in chronic kidney disease: Secondary | ICD-10-CM | POA: Diagnosis not present

## 2019-04-17 DIAGNOSIS — Z23 Encounter for immunization: Secondary | ICD-10-CM | POA: Diagnosis not present

## 2019-04-17 DIAGNOSIS — N186 End stage renal disease: Secondary | ICD-10-CM | POA: Diagnosis not present

## 2019-04-18 DIAGNOSIS — D631 Anemia in chronic kidney disease: Secondary | ICD-10-CM | POA: Diagnosis not present

## 2019-04-18 DIAGNOSIS — N2581 Secondary hyperparathyroidism of renal origin: Secondary | ICD-10-CM | POA: Diagnosis not present

## 2019-04-18 DIAGNOSIS — Z23 Encounter for immunization: Secondary | ICD-10-CM | POA: Diagnosis not present

## 2019-04-18 DIAGNOSIS — N186 End stage renal disease: Secondary | ICD-10-CM | POA: Diagnosis not present

## 2019-04-19 DIAGNOSIS — D631 Anemia in chronic kidney disease: Secondary | ICD-10-CM | POA: Diagnosis not present

## 2019-04-19 DIAGNOSIS — N2581 Secondary hyperparathyroidism of renal origin: Secondary | ICD-10-CM | POA: Diagnosis not present

## 2019-04-19 DIAGNOSIS — N186 End stage renal disease: Secondary | ICD-10-CM | POA: Diagnosis not present

## 2019-04-19 DIAGNOSIS — Z23 Encounter for immunization: Secondary | ICD-10-CM | POA: Diagnosis not present

## 2019-04-20 DIAGNOSIS — N186 End stage renal disease: Secondary | ICD-10-CM | POA: Diagnosis not present

## 2019-04-20 DIAGNOSIS — Z23 Encounter for immunization: Secondary | ICD-10-CM | POA: Diagnosis not present

## 2019-04-20 DIAGNOSIS — N2581 Secondary hyperparathyroidism of renal origin: Secondary | ICD-10-CM | POA: Diagnosis not present

## 2019-04-20 DIAGNOSIS — D631 Anemia in chronic kidney disease: Secondary | ICD-10-CM | POA: Diagnosis not present

## 2019-04-21 DIAGNOSIS — N186 End stage renal disease: Secondary | ICD-10-CM | POA: Diagnosis not present

## 2019-04-21 DIAGNOSIS — D631 Anemia in chronic kidney disease: Secondary | ICD-10-CM | POA: Diagnosis not present

## 2019-04-21 DIAGNOSIS — N2581 Secondary hyperparathyroidism of renal origin: Secondary | ICD-10-CM | POA: Diagnosis not present

## 2019-04-21 DIAGNOSIS — Z23 Encounter for immunization: Secondary | ICD-10-CM | POA: Diagnosis not present

## 2019-04-22 DIAGNOSIS — D631 Anemia in chronic kidney disease: Secondary | ICD-10-CM | POA: Diagnosis not present

## 2019-04-22 DIAGNOSIS — N2581 Secondary hyperparathyroidism of renal origin: Secondary | ICD-10-CM | POA: Diagnosis not present

## 2019-04-22 DIAGNOSIS — Z23 Encounter for immunization: Secondary | ICD-10-CM | POA: Diagnosis not present

## 2019-04-22 DIAGNOSIS — N186 End stage renal disease: Secondary | ICD-10-CM | POA: Diagnosis not present

## 2019-04-23 DIAGNOSIS — N2581 Secondary hyperparathyroidism of renal origin: Secondary | ICD-10-CM | POA: Diagnosis not present

## 2019-04-23 DIAGNOSIS — N186 End stage renal disease: Secondary | ICD-10-CM | POA: Diagnosis not present

## 2019-04-23 DIAGNOSIS — D631 Anemia in chronic kidney disease: Secondary | ICD-10-CM | POA: Diagnosis not present

## 2019-04-23 DIAGNOSIS — Z23 Encounter for immunization: Secondary | ICD-10-CM | POA: Diagnosis not present

## 2019-04-24 DIAGNOSIS — N2581 Secondary hyperparathyroidism of renal origin: Secondary | ICD-10-CM | POA: Diagnosis not present

## 2019-04-24 DIAGNOSIS — Z992 Dependence on renal dialysis: Secondary | ICD-10-CM | POA: Diagnosis not present

## 2019-04-24 DIAGNOSIS — Z23 Encounter for immunization: Secondary | ICD-10-CM | POA: Diagnosis not present

## 2019-04-24 DIAGNOSIS — D631 Anemia in chronic kidney disease: Secondary | ICD-10-CM | POA: Diagnosis not present

## 2019-04-24 DIAGNOSIS — N186 End stage renal disease: Secondary | ICD-10-CM | POA: Diagnosis not present

## 2019-04-25 DIAGNOSIS — D631 Anemia in chronic kidney disease: Secondary | ICD-10-CM | POA: Diagnosis not present

## 2019-04-25 DIAGNOSIS — N2581 Secondary hyperparathyroidism of renal origin: Secondary | ICD-10-CM | POA: Diagnosis not present

## 2019-04-25 DIAGNOSIS — Z23 Encounter for immunization: Secondary | ICD-10-CM | POA: Diagnosis not present

## 2019-04-25 DIAGNOSIS — N186 End stage renal disease: Secondary | ICD-10-CM | POA: Diagnosis not present

## 2019-04-26 DIAGNOSIS — Z23 Encounter for immunization: Secondary | ICD-10-CM | POA: Diagnosis not present

## 2019-04-26 DIAGNOSIS — N2581 Secondary hyperparathyroidism of renal origin: Secondary | ICD-10-CM | POA: Diagnosis not present

## 2019-04-26 DIAGNOSIS — D631 Anemia in chronic kidney disease: Secondary | ICD-10-CM | POA: Diagnosis not present

## 2019-04-26 DIAGNOSIS — N186 End stage renal disease: Secondary | ICD-10-CM | POA: Diagnosis not present

## 2019-04-27 DIAGNOSIS — N186 End stage renal disease: Secondary | ICD-10-CM | POA: Diagnosis not present

## 2019-04-27 DIAGNOSIS — Z23 Encounter for immunization: Secondary | ICD-10-CM | POA: Diagnosis not present

## 2019-04-27 DIAGNOSIS — D631 Anemia in chronic kidney disease: Secondary | ICD-10-CM | POA: Diagnosis not present

## 2019-04-27 DIAGNOSIS — N2581 Secondary hyperparathyroidism of renal origin: Secondary | ICD-10-CM | POA: Diagnosis not present

## 2019-04-28 DIAGNOSIS — N2581 Secondary hyperparathyroidism of renal origin: Secondary | ICD-10-CM | POA: Diagnosis not present

## 2019-04-28 DIAGNOSIS — N186 End stage renal disease: Secondary | ICD-10-CM | POA: Diagnosis not present

## 2019-04-28 DIAGNOSIS — Z23 Encounter for immunization: Secondary | ICD-10-CM | POA: Diagnosis not present

## 2019-04-28 DIAGNOSIS — D631 Anemia in chronic kidney disease: Secondary | ICD-10-CM | POA: Diagnosis not present

## 2019-04-29 DIAGNOSIS — N2581 Secondary hyperparathyroidism of renal origin: Secondary | ICD-10-CM | POA: Diagnosis not present

## 2019-04-29 DIAGNOSIS — Z23 Encounter for immunization: Secondary | ICD-10-CM | POA: Diagnosis not present

## 2019-04-29 DIAGNOSIS — D631 Anemia in chronic kidney disease: Secondary | ICD-10-CM | POA: Diagnosis not present

## 2019-04-29 DIAGNOSIS — N186 End stage renal disease: Secondary | ICD-10-CM | POA: Diagnosis not present

## 2019-04-30 DIAGNOSIS — N186 End stage renal disease: Secondary | ICD-10-CM | POA: Diagnosis not present

## 2019-04-30 DIAGNOSIS — Z23 Encounter for immunization: Secondary | ICD-10-CM | POA: Diagnosis not present

## 2019-04-30 DIAGNOSIS — N2581 Secondary hyperparathyroidism of renal origin: Secondary | ICD-10-CM | POA: Diagnosis not present

## 2019-04-30 DIAGNOSIS — D631 Anemia in chronic kidney disease: Secondary | ICD-10-CM | POA: Diagnosis not present

## 2019-05-01 DIAGNOSIS — N186 End stage renal disease: Secondary | ICD-10-CM | POA: Diagnosis not present

## 2019-05-01 DIAGNOSIS — N2581 Secondary hyperparathyroidism of renal origin: Secondary | ICD-10-CM | POA: Diagnosis not present

## 2019-05-01 DIAGNOSIS — D631 Anemia in chronic kidney disease: Secondary | ICD-10-CM | POA: Diagnosis not present

## 2019-05-01 DIAGNOSIS — Z23 Encounter for immunization: Secondary | ICD-10-CM | POA: Diagnosis not present

## 2019-05-02 DIAGNOSIS — N186 End stage renal disease: Secondary | ICD-10-CM | POA: Diagnosis not present

## 2019-05-02 DIAGNOSIS — Z4932 Encounter for adequacy testing for peritoneal dialysis: Secondary | ICD-10-CM | POA: Diagnosis not present

## 2019-05-02 DIAGNOSIS — Z23 Encounter for immunization: Secondary | ICD-10-CM | POA: Diagnosis not present

## 2019-05-02 DIAGNOSIS — E118 Type 2 diabetes mellitus with unspecified complications: Secondary | ICD-10-CM | POA: Diagnosis not present

## 2019-05-02 DIAGNOSIS — D631 Anemia in chronic kidney disease: Secondary | ICD-10-CM | POA: Diagnosis not present

## 2019-05-02 DIAGNOSIS — N2581 Secondary hyperparathyroidism of renal origin: Secondary | ICD-10-CM | POA: Diagnosis not present

## 2019-05-03 ENCOUNTER — Ambulatory Visit: Payer: Medicare Other | Admitting: Sports Medicine

## 2019-05-03 DIAGNOSIS — Z23 Encounter for immunization: Secondary | ICD-10-CM | POA: Diagnosis not present

## 2019-05-03 DIAGNOSIS — N2581 Secondary hyperparathyroidism of renal origin: Secondary | ICD-10-CM | POA: Diagnosis not present

## 2019-05-03 DIAGNOSIS — D631 Anemia in chronic kidney disease: Secondary | ICD-10-CM | POA: Diagnosis not present

## 2019-05-03 DIAGNOSIS — N186 End stage renal disease: Secondary | ICD-10-CM | POA: Diagnosis not present

## 2019-05-04 DIAGNOSIS — Z23 Encounter for immunization: Secondary | ICD-10-CM | POA: Diagnosis not present

## 2019-05-04 DIAGNOSIS — D631 Anemia in chronic kidney disease: Secondary | ICD-10-CM | POA: Diagnosis not present

## 2019-05-04 DIAGNOSIS — N186 End stage renal disease: Secondary | ICD-10-CM | POA: Diagnosis not present

## 2019-05-04 DIAGNOSIS — N2581 Secondary hyperparathyroidism of renal origin: Secondary | ICD-10-CM | POA: Diagnosis not present

## 2019-05-05 DIAGNOSIS — D631 Anemia in chronic kidney disease: Secondary | ICD-10-CM | POA: Diagnosis not present

## 2019-05-05 DIAGNOSIS — N186 End stage renal disease: Secondary | ICD-10-CM | POA: Diagnosis not present

## 2019-05-05 DIAGNOSIS — Z23 Encounter for immunization: Secondary | ICD-10-CM | POA: Diagnosis not present

## 2019-05-05 DIAGNOSIS — N2581 Secondary hyperparathyroidism of renal origin: Secondary | ICD-10-CM | POA: Diagnosis not present

## 2019-05-06 DIAGNOSIS — Z23 Encounter for immunization: Secondary | ICD-10-CM | POA: Diagnosis not present

## 2019-05-06 DIAGNOSIS — N186 End stage renal disease: Secondary | ICD-10-CM | POA: Diagnosis not present

## 2019-05-06 DIAGNOSIS — N2581 Secondary hyperparathyroidism of renal origin: Secondary | ICD-10-CM | POA: Diagnosis not present

## 2019-05-06 DIAGNOSIS — D631 Anemia in chronic kidney disease: Secondary | ICD-10-CM | POA: Diagnosis not present

## 2019-05-07 DIAGNOSIS — N2581 Secondary hyperparathyroidism of renal origin: Secondary | ICD-10-CM | POA: Diagnosis not present

## 2019-05-07 DIAGNOSIS — D631 Anemia in chronic kidney disease: Secondary | ICD-10-CM | POA: Diagnosis not present

## 2019-05-07 DIAGNOSIS — N186 End stage renal disease: Secondary | ICD-10-CM | POA: Diagnosis not present

## 2019-05-07 DIAGNOSIS — Z23 Encounter for immunization: Secondary | ICD-10-CM | POA: Diagnosis not present

## 2019-05-08 DIAGNOSIS — D631 Anemia in chronic kidney disease: Secondary | ICD-10-CM | POA: Diagnosis not present

## 2019-05-08 DIAGNOSIS — Z23 Encounter for immunization: Secondary | ICD-10-CM | POA: Diagnosis not present

## 2019-05-08 DIAGNOSIS — N186 End stage renal disease: Secondary | ICD-10-CM | POA: Diagnosis not present

## 2019-05-08 DIAGNOSIS — N2581 Secondary hyperparathyroidism of renal origin: Secondary | ICD-10-CM | POA: Diagnosis not present

## 2019-05-09 DIAGNOSIS — N2581 Secondary hyperparathyroidism of renal origin: Secondary | ICD-10-CM | POA: Diagnosis not present

## 2019-05-09 DIAGNOSIS — N186 End stage renal disease: Secondary | ICD-10-CM | POA: Diagnosis not present

## 2019-05-09 DIAGNOSIS — Z23 Encounter for immunization: Secondary | ICD-10-CM | POA: Diagnosis not present

## 2019-05-09 DIAGNOSIS — D631 Anemia in chronic kidney disease: Secondary | ICD-10-CM | POA: Diagnosis not present

## 2019-05-10 DIAGNOSIS — D631 Anemia in chronic kidney disease: Secondary | ICD-10-CM | POA: Diagnosis not present

## 2019-05-10 DIAGNOSIS — N186 End stage renal disease: Secondary | ICD-10-CM | POA: Diagnosis not present

## 2019-05-10 DIAGNOSIS — N2581 Secondary hyperparathyroidism of renal origin: Secondary | ICD-10-CM | POA: Diagnosis not present

## 2019-05-10 DIAGNOSIS — Z23 Encounter for immunization: Secondary | ICD-10-CM | POA: Diagnosis not present

## 2019-05-11 DIAGNOSIS — Z23 Encounter for immunization: Secondary | ICD-10-CM | POA: Diagnosis not present

## 2019-05-11 DIAGNOSIS — N2581 Secondary hyperparathyroidism of renal origin: Secondary | ICD-10-CM | POA: Diagnosis not present

## 2019-05-11 DIAGNOSIS — N186 End stage renal disease: Secondary | ICD-10-CM | POA: Diagnosis not present

## 2019-05-11 DIAGNOSIS — D631 Anemia in chronic kidney disease: Secondary | ICD-10-CM | POA: Diagnosis not present

## 2019-05-12 DIAGNOSIS — N186 End stage renal disease: Secondary | ICD-10-CM | POA: Diagnosis not present

## 2019-05-12 DIAGNOSIS — Z23 Encounter for immunization: Secondary | ICD-10-CM | POA: Diagnosis not present

## 2019-05-12 DIAGNOSIS — N2581 Secondary hyperparathyroidism of renal origin: Secondary | ICD-10-CM | POA: Diagnosis not present

## 2019-05-12 DIAGNOSIS — D631 Anemia in chronic kidney disease: Secondary | ICD-10-CM | POA: Diagnosis not present

## 2019-05-13 DIAGNOSIS — Z23 Encounter for immunization: Secondary | ICD-10-CM | POA: Diagnosis not present

## 2019-05-13 DIAGNOSIS — N2581 Secondary hyperparathyroidism of renal origin: Secondary | ICD-10-CM | POA: Diagnosis not present

## 2019-05-13 DIAGNOSIS — D631 Anemia in chronic kidney disease: Secondary | ICD-10-CM | POA: Diagnosis not present

## 2019-05-13 DIAGNOSIS — N186 End stage renal disease: Secondary | ICD-10-CM | POA: Diagnosis not present

## 2019-05-14 DIAGNOSIS — N2581 Secondary hyperparathyroidism of renal origin: Secondary | ICD-10-CM | POA: Diagnosis not present

## 2019-05-14 DIAGNOSIS — N186 End stage renal disease: Secondary | ICD-10-CM | POA: Diagnosis not present

## 2019-05-14 DIAGNOSIS — Z23 Encounter for immunization: Secondary | ICD-10-CM | POA: Diagnosis not present

## 2019-05-14 DIAGNOSIS — D631 Anemia in chronic kidney disease: Secondary | ICD-10-CM | POA: Diagnosis not present

## 2019-05-15 DIAGNOSIS — Z23 Encounter for immunization: Secondary | ICD-10-CM | POA: Diagnosis not present

## 2019-05-15 DIAGNOSIS — N2581 Secondary hyperparathyroidism of renal origin: Secondary | ICD-10-CM | POA: Diagnosis not present

## 2019-05-15 DIAGNOSIS — D631 Anemia in chronic kidney disease: Secondary | ICD-10-CM | POA: Diagnosis not present

## 2019-05-15 DIAGNOSIS — N186 End stage renal disease: Secondary | ICD-10-CM | POA: Diagnosis not present

## 2019-05-16 DIAGNOSIS — Z23 Encounter for immunization: Secondary | ICD-10-CM | POA: Diagnosis not present

## 2019-05-16 DIAGNOSIS — D631 Anemia in chronic kidney disease: Secondary | ICD-10-CM | POA: Diagnosis not present

## 2019-05-16 DIAGNOSIS — N186 End stage renal disease: Secondary | ICD-10-CM | POA: Diagnosis not present

## 2019-05-16 DIAGNOSIS — N2581 Secondary hyperparathyroidism of renal origin: Secondary | ICD-10-CM | POA: Diagnosis not present

## 2019-05-17 DIAGNOSIS — D631 Anemia in chronic kidney disease: Secondary | ICD-10-CM | POA: Diagnosis not present

## 2019-05-17 DIAGNOSIS — N2581 Secondary hyperparathyroidism of renal origin: Secondary | ICD-10-CM | POA: Diagnosis not present

## 2019-05-17 DIAGNOSIS — Z23 Encounter for immunization: Secondary | ICD-10-CM | POA: Diagnosis not present

## 2019-05-17 DIAGNOSIS — N186 End stage renal disease: Secondary | ICD-10-CM | POA: Diagnosis not present

## 2019-05-18 DIAGNOSIS — D631 Anemia in chronic kidney disease: Secondary | ICD-10-CM | POA: Diagnosis not present

## 2019-05-18 DIAGNOSIS — Z23 Encounter for immunization: Secondary | ICD-10-CM | POA: Diagnosis not present

## 2019-05-18 DIAGNOSIS — N186 End stage renal disease: Secondary | ICD-10-CM | POA: Diagnosis not present

## 2019-05-18 DIAGNOSIS — N2581 Secondary hyperparathyroidism of renal origin: Secondary | ICD-10-CM | POA: Diagnosis not present

## 2019-05-19 DIAGNOSIS — N2581 Secondary hyperparathyroidism of renal origin: Secondary | ICD-10-CM | POA: Diagnosis not present

## 2019-05-19 DIAGNOSIS — Z23 Encounter for immunization: Secondary | ICD-10-CM | POA: Diagnosis not present

## 2019-05-19 DIAGNOSIS — D631 Anemia in chronic kidney disease: Secondary | ICD-10-CM | POA: Diagnosis not present

## 2019-05-19 DIAGNOSIS — N186 End stage renal disease: Secondary | ICD-10-CM | POA: Diagnosis not present

## 2019-05-20 DIAGNOSIS — N186 End stage renal disease: Secondary | ICD-10-CM | POA: Diagnosis not present

## 2019-05-20 DIAGNOSIS — Z23 Encounter for immunization: Secondary | ICD-10-CM | POA: Diagnosis not present

## 2019-05-20 DIAGNOSIS — N2581 Secondary hyperparathyroidism of renal origin: Secondary | ICD-10-CM | POA: Diagnosis not present

## 2019-05-20 DIAGNOSIS — D631 Anemia in chronic kidney disease: Secondary | ICD-10-CM | POA: Diagnosis not present

## 2019-05-21 DIAGNOSIS — D631 Anemia in chronic kidney disease: Secondary | ICD-10-CM | POA: Diagnosis not present

## 2019-05-21 DIAGNOSIS — Z23 Encounter for immunization: Secondary | ICD-10-CM | POA: Diagnosis not present

## 2019-05-21 DIAGNOSIS — N186 End stage renal disease: Secondary | ICD-10-CM | POA: Diagnosis not present

## 2019-05-21 DIAGNOSIS — N2581 Secondary hyperparathyroidism of renal origin: Secondary | ICD-10-CM | POA: Diagnosis not present

## 2019-05-22 DIAGNOSIS — Z23 Encounter for immunization: Secondary | ICD-10-CM | POA: Diagnosis not present

## 2019-05-22 DIAGNOSIS — N2581 Secondary hyperparathyroidism of renal origin: Secondary | ICD-10-CM | POA: Diagnosis not present

## 2019-05-22 DIAGNOSIS — D631 Anemia in chronic kidney disease: Secondary | ICD-10-CM | POA: Diagnosis not present

## 2019-05-22 DIAGNOSIS — N186 End stage renal disease: Secondary | ICD-10-CM | POA: Diagnosis not present

## 2019-05-23 DIAGNOSIS — N186 End stage renal disease: Secondary | ICD-10-CM | POA: Diagnosis not present

## 2019-05-23 DIAGNOSIS — Z23 Encounter for immunization: Secondary | ICD-10-CM | POA: Diagnosis not present

## 2019-05-23 DIAGNOSIS — D631 Anemia in chronic kidney disease: Secondary | ICD-10-CM | POA: Diagnosis not present

## 2019-05-23 DIAGNOSIS — N2581 Secondary hyperparathyroidism of renal origin: Secondary | ICD-10-CM | POA: Diagnosis not present

## 2019-05-24 DIAGNOSIS — Z23 Encounter for immunization: Secondary | ICD-10-CM | POA: Diagnosis not present

## 2019-05-24 DIAGNOSIS — D631 Anemia in chronic kidney disease: Secondary | ICD-10-CM | POA: Diagnosis not present

## 2019-05-24 DIAGNOSIS — N186 End stage renal disease: Secondary | ICD-10-CM | POA: Diagnosis not present

## 2019-05-24 DIAGNOSIS — N2581 Secondary hyperparathyroidism of renal origin: Secondary | ICD-10-CM | POA: Diagnosis not present

## 2019-05-25 DIAGNOSIS — N2581 Secondary hyperparathyroidism of renal origin: Secondary | ICD-10-CM | POA: Diagnosis not present

## 2019-05-25 DIAGNOSIS — Z23 Encounter for immunization: Secondary | ICD-10-CM | POA: Diagnosis not present

## 2019-05-25 DIAGNOSIS — D631 Anemia in chronic kidney disease: Secondary | ICD-10-CM | POA: Diagnosis not present

## 2019-05-25 DIAGNOSIS — N186 End stage renal disease: Secondary | ICD-10-CM | POA: Diagnosis not present

## 2019-05-25 DIAGNOSIS — Z992 Dependence on renal dialysis: Secondary | ICD-10-CM | POA: Diagnosis not present

## 2019-05-26 DIAGNOSIS — N2581 Secondary hyperparathyroidism of renal origin: Secondary | ICD-10-CM | POA: Diagnosis not present

## 2019-05-26 DIAGNOSIS — N186 End stage renal disease: Secondary | ICD-10-CM | POA: Diagnosis not present

## 2019-05-26 DIAGNOSIS — D631 Anemia in chronic kidney disease: Secondary | ICD-10-CM | POA: Diagnosis not present

## 2019-05-27 DIAGNOSIS — N2581 Secondary hyperparathyroidism of renal origin: Secondary | ICD-10-CM | POA: Diagnosis not present

## 2019-05-27 DIAGNOSIS — N186 End stage renal disease: Secondary | ICD-10-CM | POA: Diagnosis not present

## 2019-05-27 DIAGNOSIS — D631 Anemia in chronic kidney disease: Secondary | ICD-10-CM | POA: Diagnosis not present

## 2019-05-28 DIAGNOSIS — N2581 Secondary hyperparathyroidism of renal origin: Secondary | ICD-10-CM | POA: Diagnosis not present

## 2019-05-28 DIAGNOSIS — D631 Anemia in chronic kidney disease: Secondary | ICD-10-CM | POA: Diagnosis not present

## 2019-05-28 DIAGNOSIS — N186 End stage renal disease: Secondary | ICD-10-CM | POA: Diagnosis not present

## 2019-05-29 DIAGNOSIS — D631 Anemia in chronic kidney disease: Secondary | ICD-10-CM | POA: Diagnosis not present

## 2019-05-29 DIAGNOSIS — N186 End stage renal disease: Secondary | ICD-10-CM | POA: Diagnosis not present

## 2019-05-29 DIAGNOSIS — N2581 Secondary hyperparathyroidism of renal origin: Secondary | ICD-10-CM | POA: Diagnosis not present

## 2019-05-30 DIAGNOSIS — H25011 Cortical age-related cataract, right eye: Secondary | ICD-10-CM | POA: Diagnosis not present

## 2019-05-30 DIAGNOSIS — H25012 Cortical age-related cataract, left eye: Secondary | ICD-10-CM | POA: Diagnosis not present

## 2019-05-30 DIAGNOSIS — H40013 Open angle with borderline findings, low risk, bilateral: Secondary | ICD-10-CM | POA: Diagnosis not present

## 2019-05-30 DIAGNOSIS — E119 Type 2 diabetes mellitus without complications: Secondary | ICD-10-CM | POA: Diagnosis not present

## 2019-05-30 DIAGNOSIS — D631 Anemia in chronic kidney disease: Secondary | ICD-10-CM | POA: Diagnosis not present

## 2019-05-30 DIAGNOSIS — N186 End stage renal disease: Secondary | ICD-10-CM | POA: Diagnosis not present

## 2019-05-30 DIAGNOSIS — N2581 Secondary hyperparathyroidism of renal origin: Secondary | ICD-10-CM | POA: Diagnosis not present

## 2019-05-31 DIAGNOSIS — N2581 Secondary hyperparathyroidism of renal origin: Secondary | ICD-10-CM | POA: Diagnosis not present

## 2019-05-31 DIAGNOSIS — D631 Anemia in chronic kidney disease: Secondary | ICD-10-CM | POA: Diagnosis not present

## 2019-05-31 DIAGNOSIS — N186 End stage renal disease: Secondary | ICD-10-CM | POA: Diagnosis not present

## 2019-06-01 DIAGNOSIS — D631 Anemia in chronic kidney disease: Secondary | ICD-10-CM | POA: Diagnosis not present

## 2019-06-01 DIAGNOSIS — N2581 Secondary hyperparathyroidism of renal origin: Secondary | ICD-10-CM | POA: Diagnosis not present

## 2019-06-01 DIAGNOSIS — N186 End stage renal disease: Secondary | ICD-10-CM | POA: Diagnosis not present

## 2019-06-02 DIAGNOSIS — N2581 Secondary hyperparathyroidism of renal origin: Secondary | ICD-10-CM | POA: Diagnosis not present

## 2019-06-02 DIAGNOSIS — N186 End stage renal disease: Secondary | ICD-10-CM | POA: Diagnosis not present

## 2019-06-02 DIAGNOSIS — D631 Anemia in chronic kidney disease: Secondary | ICD-10-CM | POA: Diagnosis not present

## 2019-06-03 DIAGNOSIS — N186 End stage renal disease: Secondary | ICD-10-CM | POA: Diagnosis not present

## 2019-06-03 DIAGNOSIS — N2581 Secondary hyperparathyroidism of renal origin: Secondary | ICD-10-CM | POA: Diagnosis not present

## 2019-06-03 DIAGNOSIS — D631 Anemia in chronic kidney disease: Secondary | ICD-10-CM | POA: Diagnosis not present

## 2019-06-04 DIAGNOSIS — N2581 Secondary hyperparathyroidism of renal origin: Secondary | ICD-10-CM | POA: Diagnosis not present

## 2019-06-04 DIAGNOSIS — D631 Anemia in chronic kidney disease: Secondary | ICD-10-CM | POA: Diagnosis not present

## 2019-06-04 DIAGNOSIS — N186 End stage renal disease: Secondary | ICD-10-CM | POA: Diagnosis not present

## 2019-06-05 DIAGNOSIS — D631 Anemia in chronic kidney disease: Secondary | ICD-10-CM | POA: Diagnosis not present

## 2019-06-05 DIAGNOSIS — N2581 Secondary hyperparathyroidism of renal origin: Secondary | ICD-10-CM | POA: Diagnosis not present

## 2019-06-05 DIAGNOSIS — N186 End stage renal disease: Secondary | ICD-10-CM | POA: Diagnosis not present

## 2019-06-06 DIAGNOSIS — N186 End stage renal disease: Secondary | ICD-10-CM | POA: Diagnosis not present

## 2019-06-06 DIAGNOSIS — D631 Anemia in chronic kidney disease: Secondary | ICD-10-CM | POA: Diagnosis not present

## 2019-06-06 DIAGNOSIS — N2581 Secondary hyperparathyroidism of renal origin: Secondary | ICD-10-CM | POA: Diagnosis not present

## 2019-06-07 DIAGNOSIS — N186 End stage renal disease: Secondary | ICD-10-CM | POA: Diagnosis not present

## 2019-06-07 DIAGNOSIS — D631 Anemia in chronic kidney disease: Secondary | ICD-10-CM | POA: Diagnosis not present

## 2019-06-07 DIAGNOSIS — N2581 Secondary hyperparathyroidism of renal origin: Secondary | ICD-10-CM | POA: Diagnosis not present

## 2019-06-08 DIAGNOSIS — D631 Anemia in chronic kidney disease: Secondary | ICD-10-CM | POA: Diagnosis not present

## 2019-06-08 DIAGNOSIS — E118 Type 2 diabetes mellitus with unspecified complications: Secondary | ICD-10-CM | POA: Diagnosis not present

## 2019-06-08 DIAGNOSIS — N2581 Secondary hyperparathyroidism of renal origin: Secondary | ICD-10-CM | POA: Diagnosis not present

## 2019-06-08 DIAGNOSIS — N186 End stage renal disease: Secondary | ICD-10-CM | POA: Diagnosis not present

## 2019-06-09 DIAGNOSIS — N186 End stage renal disease: Secondary | ICD-10-CM | POA: Diagnosis not present

## 2019-06-09 DIAGNOSIS — N2581 Secondary hyperparathyroidism of renal origin: Secondary | ICD-10-CM | POA: Diagnosis not present

## 2019-06-09 DIAGNOSIS — D631 Anemia in chronic kidney disease: Secondary | ICD-10-CM | POA: Diagnosis not present

## 2019-06-10 DIAGNOSIS — N186 End stage renal disease: Secondary | ICD-10-CM | POA: Diagnosis not present

## 2019-06-10 DIAGNOSIS — N2581 Secondary hyperparathyroidism of renal origin: Secondary | ICD-10-CM | POA: Diagnosis not present

## 2019-06-10 DIAGNOSIS — D631 Anemia in chronic kidney disease: Secondary | ICD-10-CM | POA: Diagnosis not present

## 2019-06-11 DIAGNOSIS — N2581 Secondary hyperparathyroidism of renal origin: Secondary | ICD-10-CM | POA: Diagnosis not present

## 2019-06-11 DIAGNOSIS — D631 Anemia in chronic kidney disease: Secondary | ICD-10-CM | POA: Diagnosis not present

## 2019-06-11 DIAGNOSIS — N186 End stage renal disease: Secondary | ICD-10-CM | POA: Diagnosis not present

## 2019-06-12 DIAGNOSIS — N2581 Secondary hyperparathyroidism of renal origin: Secondary | ICD-10-CM | POA: Diagnosis not present

## 2019-06-12 DIAGNOSIS — N186 End stage renal disease: Secondary | ICD-10-CM | POA: Diagnosis not present

## 2019-06-12 DIAGNOSIS — D631 Anemia in chronic kidney disease: Secondary | ICD-10-CM | POA: Diagnosis not present

## 2019-06-13 DIAGNOSIS — E1122 Type 2 diabetes mellitus with diabetic chronic kidney disease: Secondary | ICD-10-CM | POA: Diagnosis not present

## 2019-06-13 DIAGNOSIS — E1136 Type 2 diabetes mellitus with diabetic cataract: Secondary | ICD-10-CM | POA: Diagnosis not present

## 2019-06-13 DIAGNOSIS — I129 Hypertensive chronic kidney disease with stage 1 through stage 4 chronic kidney disease, or unspecified chronic kidney disease: Secondary | ICD-10-CM | POA: Diagnosis not present

## 2019-06-13 DIAGNOSIS — Z79899 Other long term (current) drug therapy: Secondary | ICD-10-CM | POA: Diagnosis not present

## 2019-06-13 DIAGNOSIS — D631 Anemia in chronic kidney disease: Secondary | ICD-10-CM | POA: Diagnosis not present

## 2019-06-13 DIAGNOSIS — H2512 Age-related nuclear cataract, left eye: Secondary | ICD-10-CM | POA: Diagnosis not present

## 2019-06-13 DIAGNOSIS — H40013 Open angle with borderline findings, low risk, bilateral: Secondary | ICD-10-CM | POA: Diagnosis not present

## 2019-06-13 DIAGNOSIS — Z992 Dependence on renal dialysis: Secondary | ICD-10-CM | POA: Diagnosis not present

## 2019-06-13 DIAGNOSIS — N2581 Secondary hyperparathyroidism of renal origin: Secondary | ICD-10-CM | POA: Diagnosis not present

## 2019-06-13 DIAGNOSIS — M1990 Unspecified osteoarthritis, unspecified site: Secondary | ICD-10-CM | POA: Diagnosis not present

## 2019-06-13 DIAGNOSIS — I519 Heart disease, unspecified: Secondary | ICD-10-CM | POA: Diagnosis not present

## 2019-06-13 DIAGNOSIS — Z8673 Personal history of transient ischemic attack (TIA), and cerebral infarction without residual deficits: Secondary | ICD-10-CM | POA: Diagnosis not present

## 2019-06-13 DIAGNOSIS — D509 Iron deficiency anemia, unspecified: Secondary | ICD-10-CM | POA: Diagnosis not present

## 2019-06-13 DIAGNOSIS — Z7982 Long term (current) use of aspirin: Secondary | ICD-10-CM | POA: Diagnosis not present

## 2019-06-13 DIAGNOSIS — N186 End stage renal disease: Secondary | ICD-10-CM | POA: Diagnosis not present

## 2019-06-13 DIAGNOSIS — I120 Hypertensive chronic kidney disease with stage 5 chronic kidney disease or end stage renal disease: Secondary | ICD-10-CM | POA: Diagnosis not present

## 2019-06-13 DIAGNOSIS — H25013 Cortical age-related cataract, bilateral: Secondary | ICD-10-CM | POA: Diagnosis not present

## 2019-06-14 DIAGNOSIS — N2581 Secondary hyperparathyroidism of renal origin: Secondary | ICD-10-CM | POA: Diagnosis not present

## 2019-06-14 DIAGNOSIS — N186 End stage renal disease: Secondary | ICD-10-CM | POA: Diagnosis not present

## 2019-06-14 DIAGNOSIS — D631 Anemia in chronic kidney disease: Secondary | ICD-10-CM | POA: Diagnosis not present

## 2019-06-15 DIAGNOSIS — N2581 Secondary hyperparathyroidism of renal origin: Secondary | ICD-10-CM | POA: Diagnosis not present

## 2019-06-15 DIAGNOSIS — N186 End stage renal disease: Secondary | ICD-10-CM | POA: Diagnosis not present

## 2019-06-15 DIAGNOSIS — D631 Anemia in chronic kidney disease: Secondary | ICD-10-CM | POA: Diagnosis not present

## 2019-06-16 DIAGNOSIS — N186 End stage renal disease: Secondary | ICD-10-CM | POA: Diagnosis not present

## 2019-06-16 DIAGNOSIS — N2581 Secondary hyperparathyroidism of renal origin: Secondary | ICD-10-CM | POA: Diagnosis not present

## 2019-06-16 DIAGNOSIS — D631 Anemia in chronic kidney disease: Secondary | ICD-10-CM | POA: Diagnosis not present

## 2019-06-17 DIAGNOSIS — N2581 Secondary hyperparathyroidism of renal origin: Secondary | ICD-10-CM | POA: Diagnosis not present

## 2019-06-17 DIAGNOSIS — N186 End stage renal disease: Secondary | ICD-10-CM | POA: Diagnosis not present

## 2019-06-17 DIAGNOSIS — D631 Anemia in chronic kidney disease: Secondary | ICD-10-CM | POA: Diagnosis not present

## 2019-06-18 DIAGNOSIS — D631 Anemia in chronic kidney disease: Secondary | ICD-10-CM | POA: Diagnosis not present

## 2019-06-18 DIAGNOSIS — N2581 Secondary hyperparathyroidism of renal origin: Secondary | ICD-10-CM | POA: Diagnosis not present

## 2019-06-18 DIAGNOSIS — N186 End stage renal disease: Secondary | ICD-10-CM | POA: Diagnosis not present

## 2019-06-19 DIAGNOSIS — N2581 Secondary hyperparathyroidism of renal origin: Secondary | ICD-10-CM | POA: Diagnosis not present

## 2019-06-19 DIAGNOSIS — D631 Anemia in chronic kidney disease: Secondary | ICD-10-CM | POA: Diagnosis not present

## 2019-06-19 DIAGNOSIS — N186 End stage renal disease: Secondary | ICD-10-CM | POA: Diagnosis not present

## 2019-06-20 DIAGNOSIS — N2581 Secondary hyperparathyroidism of renal origin: Secondary | ICD-10-CM | POA: Diagnosis not present

## 2019-06-20 DIAGNOSIS — N186 End stage renal disease: Secondary | ICD-10-CM | POA: Diagnosis not present

## 2019-06-20 DIAGNOSIS — D631 Anemia in chronic kidney disease: Secondary | ICD-10-CM | POA: Diagnosis not present

## 2019-06-21 DIAGNOSIS — N186 End stage renal disease: Secondary | ICD-10-CM | POA: Diagnosis not present

## 2019-06-21 DIAGNOSIS — H2511 Age-related nuclear cataract, right eye: Secondary | ICD-10-CM | POA: Diagnosis not present

## 2019-06-21 DIAGNOSIS — D631 Anemia in chronic kidney disease: Secondary | ICD-10-CM | POA: Diagnosis not present

## 2019-06-21 DIAGNOSIS — N2581 Secondary hyperparathyroidism of renal origin: Secondary | ICD-10-CM | POA: Diagnosis not present

## 2019-06-22 DIAGNOSIS — D631 Anemia in chronic kidney disease: Secondary | ICD-10-CM | POA: Diagnosis not present

## 2019-06-22 DIAGNOSIS — N186 End stage renal disease: Secondary | ICD-10-CM | POA: Diagnosis not present

## 2019-06-22 DIAGNOSIS — N2581 Secondary hyperparathyroidism of renal origin: Secondary | ICD-10-CM | POA: Diagnosis not present

## 2019-06-23 DIAGNOSIS — N186 End stage renal disease: Secondary | ICD-10-CM | POA: Diagnosis not present

## 2019-06-23 DIAGNOSIS — D631 Anemia in chronic kidney disease: Secondary | ICD-10-CM | POA: Diagnosis not present

## 2019-06-23 DIAGNOSIS — N2581 Secondary hyperparathyroidism of renal origin: Secondary | ICD-10-CM | POA: Diagnosis not present

## 2019-06-24 DIAGNOSIS — D631 Anemia in chronic kidney disease: Secondary | ICD-10-CM | POA: Diagnosis not present

## 2019-06-24 DIAGNOSIS — N2581 Secondary hyperparathyroidism of renal origin: Secondary | ICD-10-CM | POA: Diagnosis not present

## 2019-06-24 DIAGNOSIS — Z992 Dependence on renal dialysis: Secondary | ICD-10-CM | POA: Diagnosis not present

## 2019-06-24 DIAGNOSIS — N186 End stage renal disease: Secondary | ICD-10-CM | POA: Diagnosis not present

## 2019-06-25 DIAGNOSIS — D631 Anemia in chronic kidney disease: Secondary | ICD-10-CM | POA: Diagnosis not present

## 2019-06-25 DIAGNOSIS — N2581 Secondary hyperparathyroidism of renal origin: Secondary | ICD-10-CM | POA: Diagnosis not present

## 2019-06-25 DIAGNOSIS — N186 End stage renal disease: Secondary | ICD-10-CM | POA: Diagnosis not present

## 2019-06-26 DIAGNOSIS — N186 End stage renal disease: Secondary | ICD-10-CM | POA: Diagnosis not present

## 2019-06-26 DIAGNOSIS — N2581 Secondary hyperparathyroidism of renal origin: Secondary | ICD-10-CM | POA: Diagnosis not present

## 2019-06-26 DIAGNOSIS — D631 Anemia in chronic kidney disease: Secondary | ICD-10-CM | POA: Diagnosis not present

## 2019-06-27 DIAGNOSIS — N2581 Secondary hyperparathyroidism of renal origin: Secondary | ICD-10-CM | POA: Diagnosis not present

## 2019-06-27 DIAGNOSIS — N186 End stage renal disease: Secondary | ICD-10-CM | POA: Diagnosis not present

## 2019-06-27 DIAGNOSIS — D631 Anemia in chronic kidney disease: Secondary | ICD-10-CM | POA: Diagnosis not present

## 2019-06-28 DIAGNOSIS — N186 End stage renal disease: Secondary | ICD-10-CM | POA: Diagnosis not present

## 2019-06-28 DIAGNOSIS — D631 Anemia in chronic kidney disease: Secondary | ICD-10-CM | POA: Diagnosis not present

## 2019-06-28 DIAGNOSIS — N2581 Secondary hyperparathyroidism of renal origin: Secondary | ICD-10-CM | POA: Diagnosis not present

## 2019-06-29 DIAGNOSIS — N2581 Secondary hyperparathyroidism of renal origin: Secondary | ICD-10-CM | POA: Diagnosis not present

## 2019-06-29 DIAGNOSIS — D631 Anemia in chronic kidney disease: Secondary | ICD-10-CM | POA: Diagnosis not present

## 2019-06-29 DIAGNOSIS — N186 End stage renal disease: Secondary | ICD-10-CM | POA: Diagnosis not present

## 2019-06-30 DIAGNOSIS — D631 Anemia in chronic kidney disease: Secondary | ICD-10-CM | POA: Diagnosis not present

## 2019-06-30 DIAGNOSIS — N186 End stage renal disease: Secondary | ICD-10-CM | POA: Diagnosis not present

## 2019-06-30 DIAGNOSIS — N2581 Secondary hyperparathyroidism of renal origin: Secondary | ICD-10-CM | POA: Diagnosis not present

## 2019-07-01 DIAGNOSIS — N186 End stage renal disease: Secondary | ICD-10-CM | POA: Diagnosis not present

## 2019-07-01 DIAGNOSIS — N2581 Secondary hyperparathyroidism of renal origin: Secondary | ICD-10-CM | POA: Diagnosis not present

## 2019-07-01 DIAGNOSIS — D631 Anemia in chronic kidney disease: Secondary | ICD-10-CM | POA: Diagnosis not present

## 2019-07-02 DIAGNOSIS — D631 Anemia in chronic kidney disease: Secondary | ICD-10-CM | POA: Diagnosis not present

## 2019-07-02 DIAGNOSIS — N186 End stage renal disease: Secondary | ICD-10-CM | POA: Diagnosis not present

## 2019-07-02 DIAGNOSIS — N2581 Secondary hyperparathyroidism of renal origin: Secondary | ICD-10-CM | POA: Diagnosis not present

## 2019-07-03 DIAGNOSIS — N2581 Secondary hyperparathyroidism of renal origin: Secondary | ICD-10-CM | POA: Diagnosis not present

## 2019-07-03 DIAGNOSIS — D631 Anemia in chronic kidney disease: Secondary | ICD-10-CM | POA: Diagnosis not present

## 2019-07-03 DIAGNOSIS — N186 End stage renal disease: Secondary | ICD-10-CM | POA: Diagnosis not present

## 2019-07-04 DIAGNOSIS — N186 End stage renal disease: Secondary | ICD-10-CM | POA: Diagnosis not present

## 2019-07-04 DIAGNOSIS — N2581 Secondary hyperparathyroidism of renal origin: Secondary | ICD-10-CM | POA: Diagnosis not present

## 2019-07-04 DIAGNOSIS — D631 Anemia in chronic kidney disease: Secondary | ICD-10-CM | POA: Diagnosis not present

## 2019-07-05 DIAGNOSIS — D631 Anemia in chronic kidney disease: Secondary | ICD-10-CM | POA: Diagnosis not present

## 2019-07-05 DIAGNOSIS — N186 End stage renal disease: Secondary | ICD-10-CM | POA: Diagnosis not present

## 2019-07-05 DIAGNOSIS — N2581 Secondary hyperparathyroidism of renal origin: Secondary | ICD-10-CM | POA: Diagnosis not present

## 2019-07-06 DIAGNOSIS — D631 Anemia in chronic kidney disease: Secondary | ICD-10-CM | POA: Diagnosis not present

## 2019-07-06 DIAGNOSIS — N186 End stage renal disease: Secondary | ICD-10-CM | POA: Diagnosis not present

## 2019-07-06 DIAGNOSIS — N2581 Secondary hyperparathyroidism of renal origin: Secondary | ICD-10-CM | POA: Diagnosis not present

## 2019-07-06 DIAGNOSIS — E118 Type 2 diabetes mellitus with unspecified complications: Secondary | ICD-10-CM | POA: Diagnosis not present

## 2019-07-07 DIAGNOSIS — D631 Anemia in chronic kidney disease: Secondary | ICD-10-CM | POA: Diagnosis not present

## 2019-07-07 DIAGNOSIS — N186 End stage renal disease: Secondary | ICD-10-CM | POA: Diagnosis not present

## 2019-07-07 DIAGNOSIS — N2581 Secondary hyperparathyroidism of renal origin: Secondary | ICD-10-CM | POA: Diagnosis not present

## 2019-07-08 ENCOUNTER — Other Ambulatory Visit: Payer: Self-pay | Admitting: Sports Medicine

## 2019-07-08 ENCOUNTER — Other Ambulatory Visit: Payer: Self-pay | Admitting: *Deleted

## 2019-07-08 ENCOUNTER — Telehealth: Payer: Self-pay | Admitting: Sports Medicine

## 2019-07-08 DIAGNOSIS — N186 End stage renal disease: Secondary | ICD-10-CM | POA: Diagnosis not present

## 2019-07-08 DIAGNOSIS — N2581 Secondary hyperparathyroidism of renal origin: Secondary | ICD-10-CM | POA: Diagnosis not present

## 2019-07-08 DIAGNOSIS — D631 Anemia in chronic kidney disease: Secondary | ICD-10-CM | POA: Diagnosis not present

## 2019-07-08 MED ORDER — PIOGLITAZONE HCL 30 MG PO TABS: 30.0000 mg | ORAL_TABLET | Freq: Every day | ORAL | 0 refills | Status: DC

## 2019-07-08 NOTE — Telephone Encounter (Signed)
#  [phone] sent to pharm. Pt notified.

## 2019-07-08 NOTE — Telephone Encounter (Signed)
PT called for a refill on his medication. Appointment scheduled on 07/12/19.  Pt has not taken any medication for the past 2 days.    ACTOS (diabetic FU)

## 2019-07-09 DIAGNOSIS — D631 Anemia in chronic kidney disease: Secondary | ICD-10-CM | POA: Diagnosis not present

## 2019-07-09 DIAGNOSIS — N186 End stage renal disease: Secondary | ICD-10-CM | POA: Diagnosis not present

## 2019-07-09 DIAGNOSIS — N2581 Secondary hyperparathyroidism of renal origin: Secondary | ICD-10-CM | POA: Diagnosis not present

## 2019-07-10 DIAGNOSIS — N2581 Secondary hyperparathyroidism of renal origin: Secondary | ICD-10-CM | POA: Diagnosis not present

## 2019-07-10 DIAGNOSIS — N186 End stage renal disease: Secondary | ICD-10-CM | POA: Diagnosis not present

## 2019-07-10 DIAGNOSIS — D631 Anemia in chronic kidney disease: Secondary | ICD-10-CM | POA: Diagnosis not present

## 2019-07-11 DIAGNOSIS — N2581 Secondary hyperparathyroidism of renal origin: Secondary | ICD-10-CM | POA: Diagnosis not present

## 2019-07-11 DIAGNOSIS — N186 End stage renal disease: Secondary | ICD-10-CM | POA: Diagnosis not present

## 2019-07-11 DIAGNOSIS — D631 Anemia in chronic kidney disease: Secondary | ICD-10-CM | POA: Diagnosis not present

## 2019-07-12 ENCOUNTER — Ambulatory Visit (INDEPENDENT_AMBULATORY_CARE_PROVIDER_SITE_OTHER): Payer: Medicare Other | Admitting: Sports Medicine

## 2019-07-12 ENCOUNTER — Encounter: Payer: Self-pay | Admitting: Sports Medicine

## 2019-07-12 ENCOUNTER — Other Ambulatory Visit: Payer: Self-pay

## 2019-07-12 VITALS — BP 143/65 | HR 79 | Ht 69.0 in | Wt 170.0 lb

## 2019-07-12 DIAGNOSIS — N186 End stage renal disease: Secondary | ICD-10-CM

## 2019-07-12 DIAGNOSIS — Z992 Dependence on renal dialysis: Secondary | ICD-10-CM

## 2019-07-12 DIAGNOSIS — N2581 Secondary hyperparathyroidism of renal origin: Secondary | ICD-10-CM | POA: Diagnosis not present

## 2019-07-12 DIAGNOSIS — D631 Anemia in chronic kidney disease: Secondary | ICD-10-CM | POA: Diagnosis not present

## 2019-07-12 DIAGNOSIS — I10 Essential (primary) hypertension: Secondary | ICD-10-CM

## 2019-07-12 DIAGNOSIS — E119 Type 2 diabetes mellitus without complications: Secondary | ICD-10-CM | POA: Diagnosis not present

## 2019-07-12 LAB — POCT GLYCOSYLATED HEMOGLOBIN (HGB A1C): Hemoglobin A1C: 6.8 % — AB (ref 4.0–5.6)

## 2019-07-12 MED ORDER — PIOGLITAZONE HCL 30 MG PO TABS: 30.0000 mg | ORAL_TABLET | Freq: Every day | ORAL | 3 refills | Status: DC

## 2019-07-12 NOTE — Assessment & Plan Note (Signed)
[place] does have end-stage renal disease, on dialysis, further management per nephrology.

## 2019-07-12 NOTE — Assessment & Plan Note (Signed)
Diabetes is well controlled, last hemoglobin A1c was 6.8% at [facility] in the middle of April. Diabetic foot exam performed today, eye exam was last month. Continue Actos, refilling with a 90-day supply.

## 2019-07-12 NOTE — Progress Notes (Signed)
    Procedures performed today:    None.  Independent interpretation of notes and tests performed by another provider:   None.  Brief History, Exam, Impression, and Recommendations:    Diabetes mellitus type 2 in nonobese Diabetes is well controlled, last hemoglobin A1c was 6.8% at [facility] in the middle of April. Diabetic foot exam performed today, eye exam was last month. Continue Actos, refilling with a 90-day supply.  Essential hypertension, benign Hypertension is overall controlled, he does get some episodes of orthostasis when outside working, he will take his amlodipine in the morning and skip it on days that he is going to be working outside from now on.  End stage renal disease on dialysis [facility]) [place] does have end-stage renal disease, on dialysis, further management per nephrology.    ___________________________________________ Gwen Her. Dianah Field, M.D., ABFM., CAQSM. Primary Care and [place] Instructor of [place] of [facility] of Medicine

## 2019-07-12 NOTE — Assessment & Plan Note (Signed)
Hypertension is overall controlled, he does get some episodes of orthostasis when outside working, he will take his amlodipine in the morning and skip it on days that he is going to be working outside from now on.

## 2019-07-13 DIAGNOSIS — D631 Anemia in chronic kidney disease: Secondary | ICD-10-CM | POA: Diagnosis not present

## 2019-07-13 DIAGNOSIS — I10 Essential (primary) hypertension: Secondary | ICD-10-CM | POA: Diagnosis not present

## 2019-07-13 DIAGNOSIS — N2581 Secondary hyperparathyroidism of renal origin: Secondary | ICD-10-CM | POA: Diagnosis not present

## 2019-07-13 DIAGNOSIS — E119 Type 2 diabetes mellitus without complications: Secondary | ICD-10-CM | POA: Diagnosis not present

## 2019-07-13 DIAGNOSIS — N186 End stage renal disease: Secondary | ICD-10-CM | POA: Diagnosis not present

## 2019-07-14 DIAGNOSIS — D631 Anemia in chronic kidney disease: Secondary | ICD-10-CM | POA: Diagnosis not present

## 2019-07-14 DIAGNOSIS — N186 End stage renal disease: Secondary | ICD-10-CM | POA: Diagnosis not present

## 2019-07-14 DIAGNOSIS — N2581 Secondary hyperparathyroidism of renal origin: Secondary | ICD-10-CM | POA: Diagnosis not present

## 2019-07-14 LAB — COMPLETE METABOLIC PANEL WITH GFR
AG Ratio: 1.4 (calc) (ref 1.0–2.5)
ALT: 17 U/L (ref 9–46)
AST: 20 U/L (ref 10–35)
Albumin: 3.6 g/dL (ref 3.6–5.1)
Alkaline phosphatase (APISO): 67 U/L (ref 35–144)
BUN/Creatinine Ratio: 5 (calc) — ABNORMAL LOW (ref 6–22)
BUN: 46 mg/dL — ABNORMAL HIGH (ref 7–25)
CO2: 29 mmol/L (ref 20–32)
Calcium: 9.7 mg/dL (ref 8.6–10.3)
Chloride: 100 mmol/L (ref 98–110)
Creat: 9.96 mg/dL — ABNORMAL HIGH (ref 0.70–1.18)
GFR, Est African American: 5 mL/min/{1.73_m2} — ABNORMAL LOW (ref >=60)
GFR, Est Non African American: 4 mL/min/{1.73_m2} — ABNORMAL LOW (ref >=60)
Globulin: 2.6 g/dL (calc) (ref 1.9–3.7)
Glucose, Bld: 169 mg/dL — ABNORMAL HIGH (ref 65–99)
Potassium: 4.9 mmol/L (ref 3.5–5.3)
Sodium: 138 mmol/L (ref 135–146)
Total Bilirubin: 0.3 mg/dL (ref 0.2–1.2)
Total Protein: 6.2 g/dL (ref 6.1–8.1)

## 2019-07-14 LAB — LIPID PANEL W/REFLEX DIRECT LDL
Cholesterol: 182 mg/dL (ref <200)
HDL: 49 mg/dL (ref >=40)
LDL Cholesterol (Calc): 115 mg/dL (calc) — ABNORMAL HIGH
Non-HDL Cholesterol (Calc): 133 mg/dL (calc) — ABNORMAL HIGH (ref <130)
Total CHOL/HDL Ratio: 3.7 (calc) (ref <5.0)
Triglycerides: 82 mg/dL (ref <150)

## 2019-07-14 LAB — HEMOGLOBIN A1C
Hgb A1c MFr Bld: 6.2 % of total Hgb — ABNORMAL HIGH (ref <5.7)
Mean Plasma Glucose: 131 (calc)
eAG (mmol/L): 7.3 (calc)

## 2019-07-14 LAB — TSH: TSH: 1.75 mIU/L (ref 0.40–4.50)

## 2019-07-14 LAB — CBC
HCT: 36 % — ABNORMAL LOW (ref 38.5–50.0)
Hemoglobin: 11.4 g/dL — ABNORMAL LOW (ref 13.2–17.1)
MCH: 26.3 pg — ABNORMAL LOW (ref 27.0–33.0)
MCHC: 31.7 g/dL — ABNORMAL LOW (ref 32.0–36.0)
MCV: 83.1 fL (ref 80.0–100.0)
MPV: 10.8 fL (ref 7.5–12.5)
Platelets: 201 10*3/uL (ref 140–400)
RBC: 4.33 10*6/uL (ref 4.20–5.80)
RDW: 18.3 % — ABNORMAL HIGH (ref 11.0–15.0)
WBC: 5.2 10*3/uL (ref 3.8–10.8)

## 2019-07-15 DIAGNOSIS — D631 Anemia in chronic kidney disease: Secondary | ICD-10-CM | POA: Diagnosis not present

## 2019-07-15 DIAGNOSIS — N2581 Secondary hyperparathyroidism of renal origin: Secondary | ICD-10-CM | POA: Diagnosis not present

## 2019-07-15 DIAGNOSIS — N186 End stage renal disease: Secondary | ICD-10-CM | POA: Diagnosis not present

## 2019-07-16 DIAGNOSIS — N2581 Secondary hyperparathyroidism of renal origin: Secondary | ICD-10-CM | POA: Diagnosis not present

## 2019-07-16 DIAGNOSIS — D631 Anemia in chronic kidney disease: Secondary | ICD-10-CM | POA: Diagnosis not present

## 2019-07-16 DIAGNOSIS — N186 End stage renal disease: Secondary | ICD-10-CM | POA: Diagnosis not present

## 2019-07-17 DIAGNOSIS — N2581 Secondary hyperparathyroidism of renal origin: Secondary | ICD-10-CM | POA: Diagnosis not present

## 2019-07-17 DIAGNOSIS — N186 End stage renal disease: Secondary | ICD-10-CM | POA: Diagnosis not present

## 2019-07-17 DIAGNOSIS — D631 Anemia in chronic kidney disease: Secondary | ICD-10-CM | POA: Diagnosis not present

## 2019-07-18 DIAGNOSIS — M1990 Unspecified osteoarthritis, unspecified site: Secondary | ICD-10-CM | POA: Diagnosis not present

## 2019-07-18 DIAGNOSIS — Z992 Dependence on renal dialysis: Secondary | ICD-10-CM | POA: Diagnosis not present

## 2019-07-18 DIAGNOSIS — H40013 Open angle with borderline findings, low risk, bilateral: Secondary | ICD-10-CM | POA: Diagnosis not present

## 2019-07-18 DIAGNOSIS — H25011 Cortical age-related cataract, right eye: Secondary | ICD-10-CM | POA: Diagnosis not present

## 2019-07-18 DIAGNOSIS — N186 End stage renal disease: Secondary | ICD-10-CM | POA: Diagnosis not present

## 2019-07-18 DIAGNOSIS — H2511 Age-related nuclear cataract, right eye: Secondary | ICD-10-CM | POA: Diagnosis not present

## 2019-07-18 DIAGNOSIS — Z87891 Personal history of nicotine dependence: Secondary | ICD-10-CM | POA: Diagnosis not present

## 2019-07-18 DIAGNOSIS — D631 Anemia in chronic kidney disease: Secondary | ICD-10-CM | POA: Diagnosis not present

## 2019-07-18 DIAGNOSIS — N2581 Secondary hyperparathyroidism of renal origin: Secondary | ICD-10-CM | POA: Diagnosis not present

## 2019-07-18 DIAGNOSIS — E1122 Type 2 diabetes mellitus with diabetic chronic kidney disease: Secondary | ICD-10-CM | POA: Diagnosis not present

## 2019-07-18 DIAGNOSIS — Z8673 Personal history of transient ischemic attack (TIA), and cerebral infarction without residual deficits: Secondary | ICD-10-CM | POA: Diagnosis not present

## 2019-07-18 DIAGNOSIS — I2510 Atherosclerotic heart disease of native coronary artery without angina pectoris: Secondary | ICD-10-CM | POA: Diagnosis not present

## 2019-07-18 DIAGNOSIS — I120 Hypertensive chronic kidney disease with stage 5 chronic kidney disease or end stage renal disease: Secondary | ICD-10-CM | POA: Diagnosis not present

## 2019-07-18 DIAGNOSIS — Z79899 Other long term (current) drug therapy: Secondary | ICD-10-CM | POA: Diagnosis not present

## 2019-07-18 DIAGNOSIS — E1136 Type 2 diabetes mellitus with diabetic cataract: Secondary | ICD-10-CM | POA: Diagnosis not present

## 2019-07-19 DIAGNOSIS — N186 End stage renal disease: Secondary | ICD-10-CM | POA: Diagnosis not present

## 2019-07-19 DIAGNOSIS — N2581 Secondary hyperparathyroidism of renal origin: Secondary | ICD-10-CM | POA: Diagnosis not present

## 2019-07-19 DIAGNOSIS — D631 Anemia in chronic kidney disease: Secondary | ICD-10-CM | POA: Diagnosis not present

## 2019-07-20 DIAGNOSIS — D631 Anemia in chronic kidney disease: Secondary | ICD-10-CM | POA: Diagnosis not present

## 2019-07-20 DIAGNOSIS — N186 End stage renal disease: Secondary | ICD-10-CM | POA: Diagnosis not present

## 2019-07-20 DIAGNOSIS — N2581 Secondary hyperparathyroidism of renal origin: Secondary | ICD-10-CM | POA: Diagnosis not present

## 2019-07-21 DIAGNOSIS — N2581 Secondary hyperparathyroidism of renal origin: Secondary | ICD-10-CM | POA: Diagnosis not present

## 2019-07-21 DIAGNOSIS — N186 End stage renal disease: Secondary | ICD-10-CM | POA: Diagnosis not present

## 2019-07-21 DIAGNOSIS — D631 Anemia in chronic kidney disease: Secondary | ICD-10-CM | POA: Diagnosis not present

## 2019-07-22 DIAGNOSIS — N2581 Secondary hyperparathyroidism of renal origin: Secondary | ICD-10-CM | POA: Diagnosis not present

## 2019-07-22 DIAGNOSIS — N186 End stage renal disease: Secondary | ICD-10-CM | POA: Diagnosis not present

## 2019-07-22 DIAGNOSIS — D631 Anemia in chronic kidney disease: Secondary | ICD-10-CM | POA: Diagnosis not present

## 2019-07-23 DIAGNOSIS — N186 End stage renal disease: Secondary | ICD-10-CM | POA: Diagnosis not present

## 2019-07-23 DIAGNOSIS — D631 Anemia in chronic kidney disease: Secondary | ICD-10-CM | POA: Diagnosis not present

## 2019-07-23 DIAGNOSIS — N2581 Secondary hyperparathyroidism of renal origin: Secondary | ICD-10-CM | POA: Diagnosis not present

## 2019-07-24 DIAGNOSIS — D631 Anemia in chronic kidney disease: Secondary | ICD-10-CM | POA: Diagnosis not present

## 2019-07-24 DIAGNOSIS — N186 End stage renal disease: Secondary | ICD-10-CM | POA: Diagnosis not present

## 2019-07-24 DIAGNOSIS — N2581 Secondary hyperparathyroidism of renal origin: Secondary | ICD-10-CM | POA: Diagnosis not present

## 2019-07-25 DIAGNOSIS — Z992 Dependence on renal dialysis: Secondary | ICD-10-CM | POA: Diagnosis not present

## 2019-07-25 DIAGNOSIS — N2581 Secondary hyperparathyroidism of renal origin: Secondary | ICD-10-CM | POA: Diagnosis not present

## 2019-07-25 DIAGNOSIS — D631 Anemia in chronic kidney disease: Secondary | ICD-10-CM | POA: Diagnosis not present

## 2019-07-25 DIAGNOSIS — N186 End stage renal disease: Secondary | ICD-10-CM | POA: Diagnosis not present

## 2019-07-27 ENCOUNTER — Other Ambulatory Visit: Payer: Self-pay | Admitting: Sports Medicine

## 2019-07-27 DIAGNOSIS — E119 Type 2 diabetes mellitus without complications: Secondary | ICD-10-CM

## 2019-08-16 ENCOUNTER — Telehealth: Payer: Self-pay

## 2019-08-16 NOTE — Telephone Encounter (Signed)
Pt called stating he would like a referral or testing for spontaneous cervical myelopathy.   Per Dr. Darene Lamer, patient needs appointment for evaluation.   Patient has contacted scheduling.

## 2019-08-24 ENCOUNTER — Other Ambulatory Visit: Payer: Self-pay

## 2019-08-24 ENCOUNTER — Ambulatory Visit (INDEPENDENT_AMBULATORY_CARE_PROVIDER_SITE_OTHER): Payer: Medicare Other

## 2019-08-24 ENCOUNTER — Ambulatory Visit (INDEPENDENT_AMBULATORY_CARE_PROVIDER_SITE_OTHER): Payer: Medicare Other | Admitting: Sports Medicine

## 2019-08-24 DIAGNOSIS — R531 Weakness: Secondary | ICD-10-CM

## 2019-08-24 DIAGNOSIS — G959 Disease of spinal cord, unspecified: Secondary | ICD-10-CM | POA: Diagnosis not present

## 2019-08-24 DIAGNOSIS — Z992 Dependence on renal dialysis: Secondary | ICD-10-CM | POA: Diagnosis not present

## 2019-08-24 DIAGNOSIS — N186 End stage renal disease: Secondary | ICD-10-CM | POA: Diagnosis not present

## 2019-08-24 IMAGING — DX DG CERVICAL SPINE COMPLETE 4+V
6 series · 6 of 6 positions shown · non-contrast
Comparison: No pertinent prior studies available for comparison.

CLINICAL DATA: Progressive focal motor weakness. Additional
provided: Lower neck pain.

EXAM:
CERVICAL SPINE - COMPLETE 4+ VIEW

[c-spine lat]
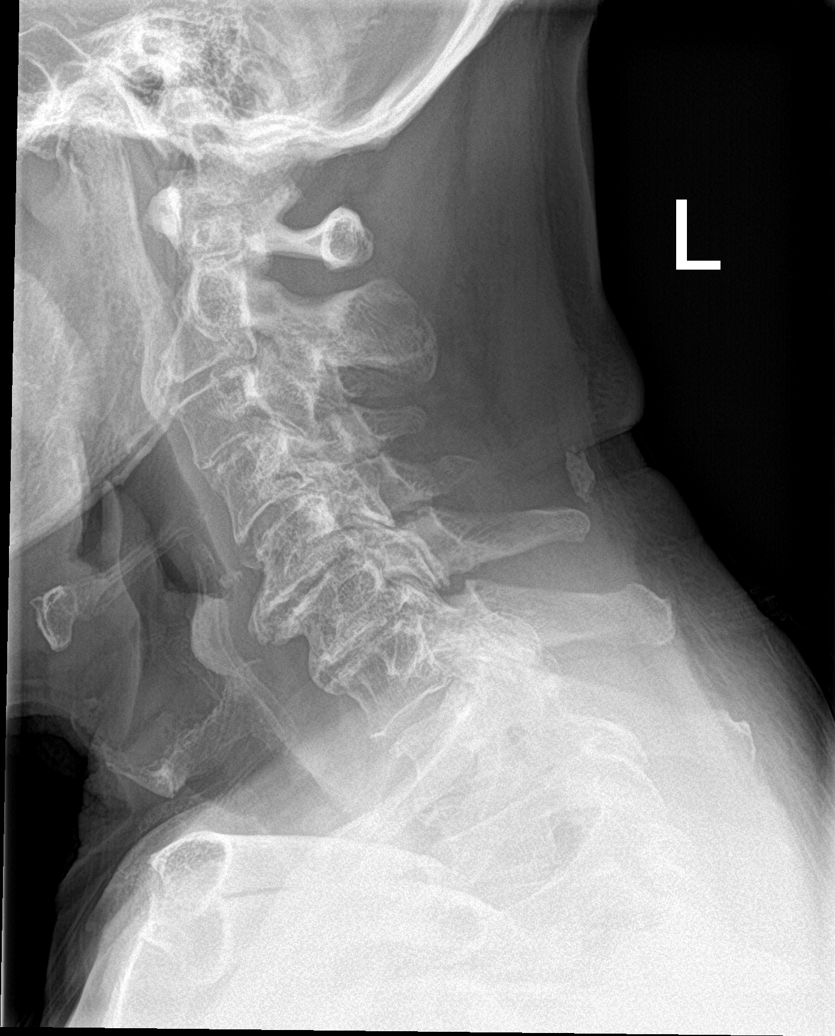

[c-spine obl (1 of 2)]
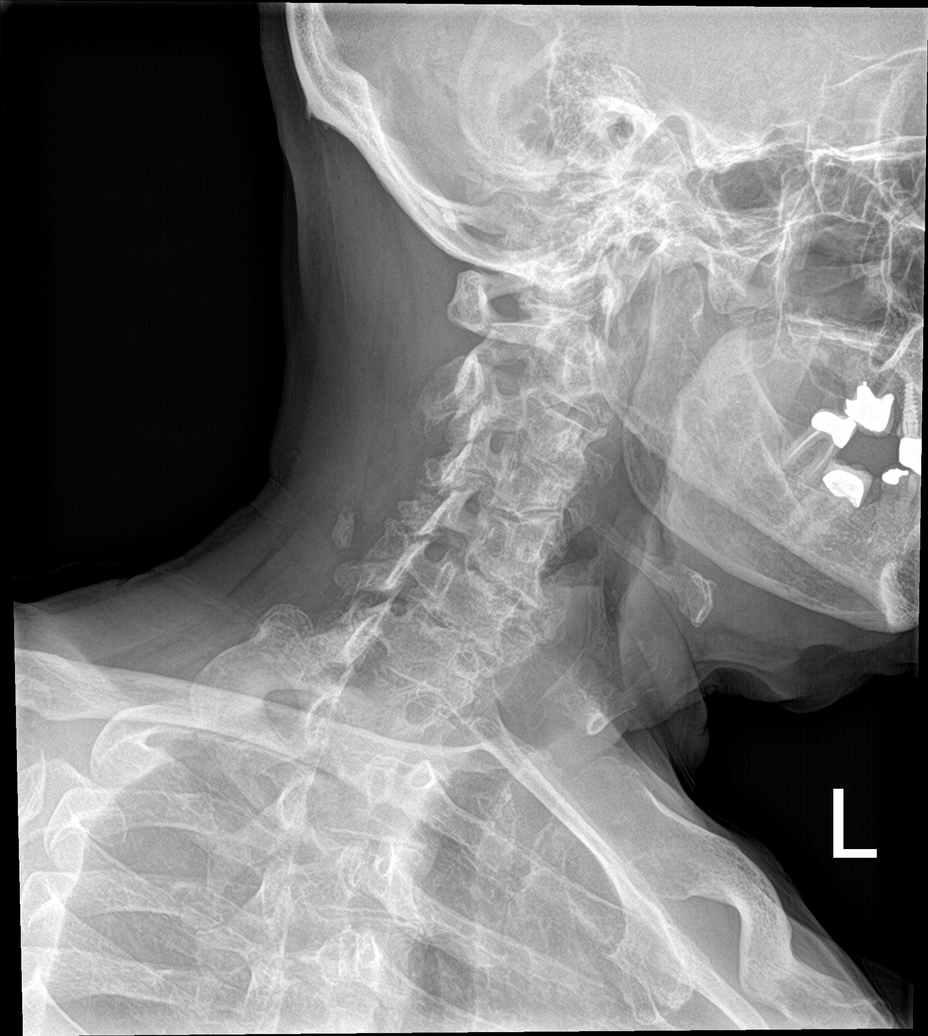

[c-spine obl (2 of 2)]
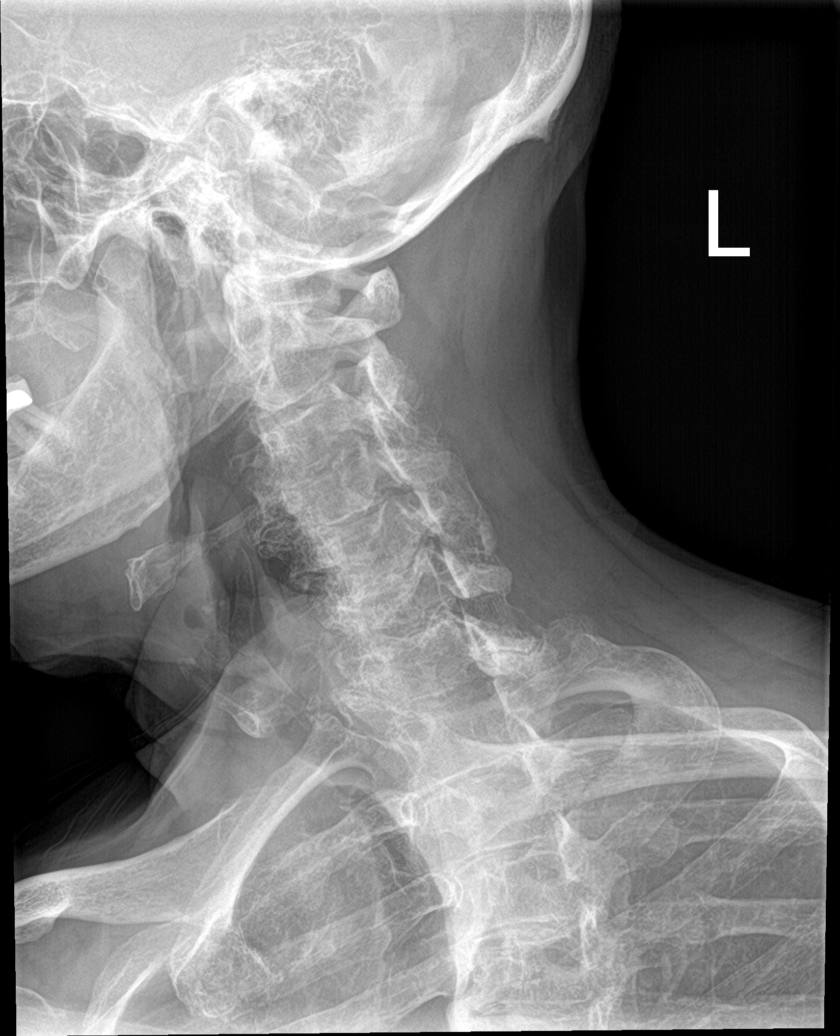

[c-spine ap]
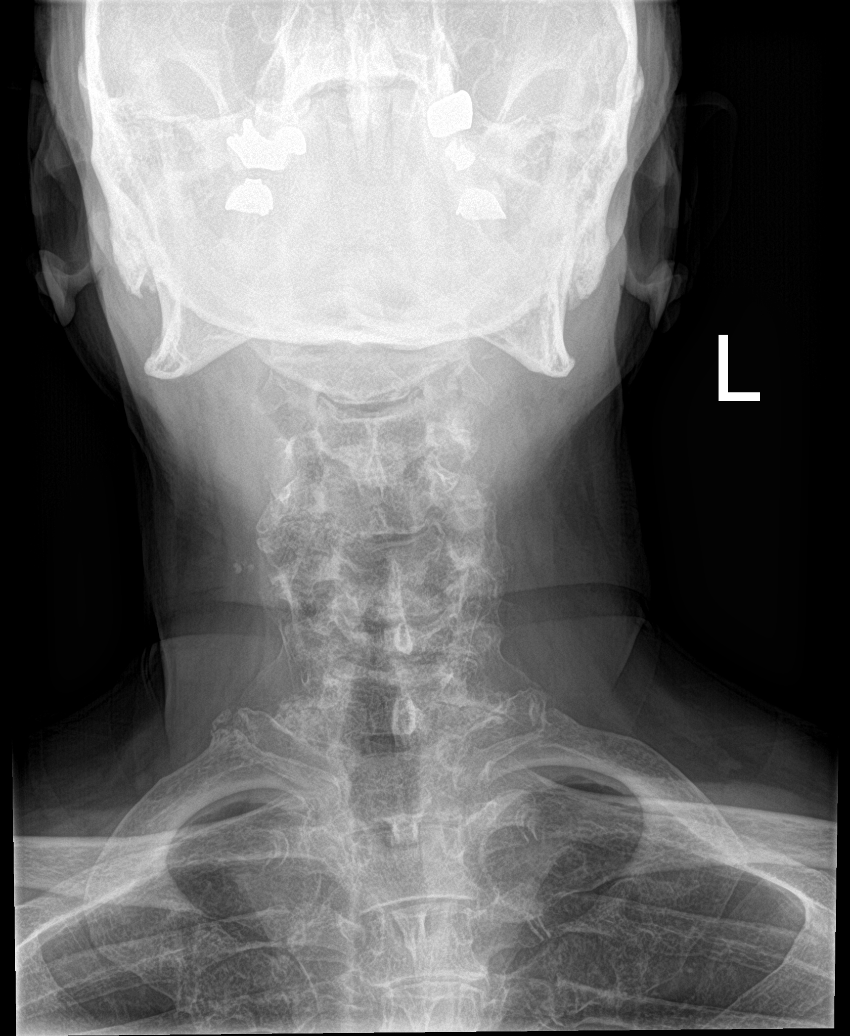

[c-spine open mouth]
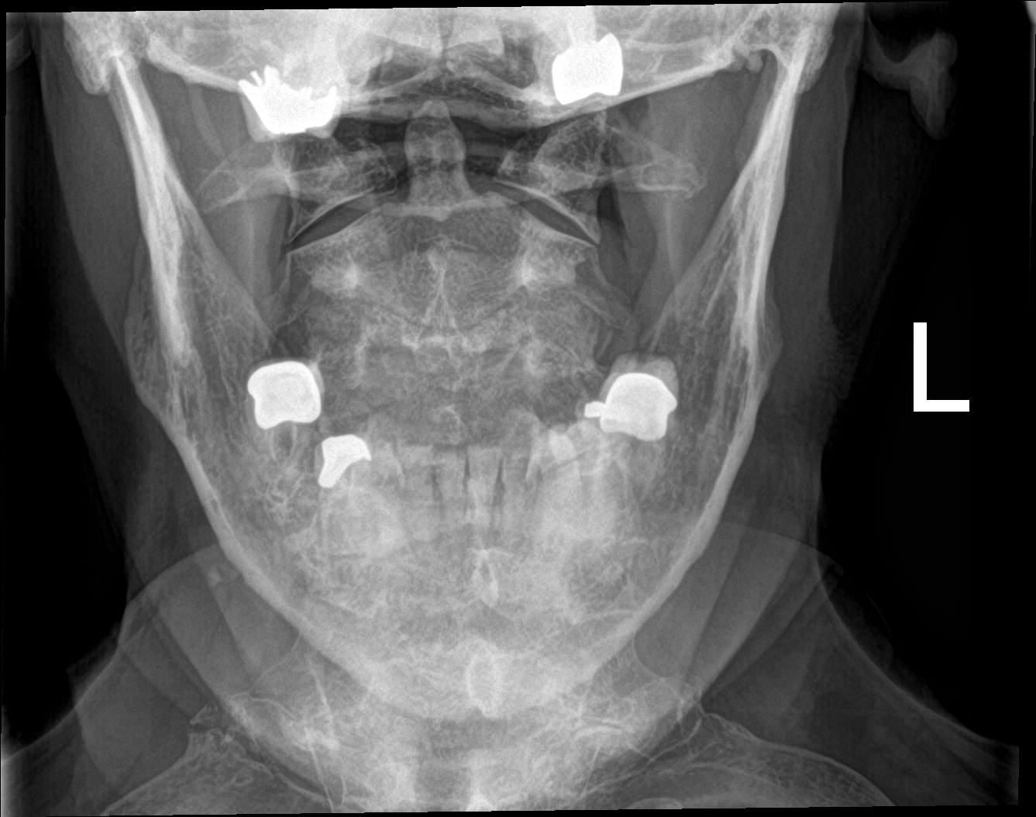

[c-spine swimmers]
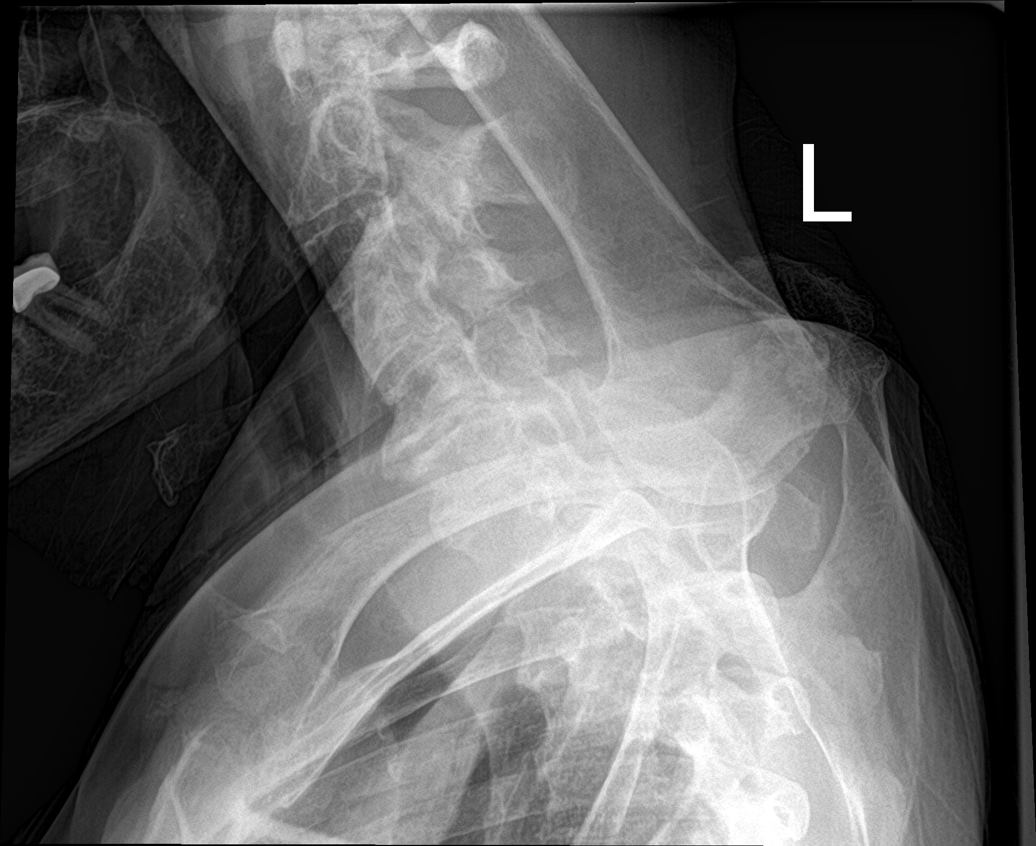

[6 of 6 positions shown; findings below may reference images not displayed]

FINDINGS: Trace C2-C3 grade 1 retrolisthesis.

Cervical vertebral body height is maintained. No radiographic
evidence of acute fracture.

Multilevel disc space narrowing. Most notably there is advanced disc
space narrowing at C3-C4, C5-C6 and C6-C7. Multilevel uncovertebral
and facet hypertrophy. Multilevel posterior disc osteophytes, most
notably at C4-C5, C5-C6 and C6-C7. Somewhat prominent ventral
osteophytes within the lower cervical spine. Multilevel bony neural
foraminal narrowing, greatest on the left at C4-C5 and C6-C7, and on
the right at C3-C4, C4-C5, C5-C6 and C6-C7.

Unremarkable appearance of the C1-C2 articulation on the dedicated
odontoid view.
IMPRESSION: No radiographic evidence of acute fracture to the cervical spine.

Cervical spondylosis as outlined.

Minimal C2-C3 grade 1 retrolisthesis.

## 2019-08-24 MED ORDER — TRIAZOLAM 0.25 MG PO TABS: ORAL_TABLET | ORAL | 0 refills | Status: DC

## 2019-08-24 NOTE — Assessment & Plan Note (Signed)
Brian Rose has been having neck pain, he is also having some progressive weakness in dorsiflexion of his wrists, he has a strong family history of cervical myelopathy. Because of the progressive weakness/focal neurologic symptoms we are going to proceed with x-rays and an MRI. Triazolam for preprocedural anxiolysis, no dosage adjustment needed for dialysis or renal impairment.

## 2019-08-24 NOTE — Progress Notes (Signed)
    Procedures performed today:    None.  Independent interpretation of notes and tests performed by another provider:   None.  Brief History, Exam, Impression, and Recommendations:    Progressive focal motor weakness Mitul has been having neck pain, he is also having some progressive weakness in dorsiflexion of his wrists, he has a strong family history of cervical myelopathy. Because of the progressive weakness/focal neurologic symptoms we are going to proceed with x-rays and an MRI. Triazolam for preprocedural anxiolysis, no dosage adjustment needed for dialysis or renal impairment.    ___________________________________________ Gwen Her. Dianah Field, M.D., ABFM., CAQSM. Primary Care and [place] Instructor of [place] of [facility] of Medicine

## 2019-08-25 DIAGNOSIS — N2581 Secondary hyperparathyroidism of renal origin: Secondary | ICD-10-CM | POA: Diagnosis not present

## 2019-08-25 DIAGNOSIS — D631 Anemia in chronic kidney disease: Secondary | ICD-10-CM | POA: Diagnosis not present

## 2019-08-25 DIAGNOSIS — N186 End stage renal disease: Secondary | ICD-10-CM | POA: Diagnosis not present

## 2019-08-26 DIAGNOSIS — N2581 Secondary hyperparathyroidism of renal origin: Secondary | ICD-10-CM | POA: Diagnosis not present

## 2019-08-26 DIAGNOSIS — D631 Anemia in chronic kidney disease: Secondary | ICD-10-CM | POA: Diagnosis not present

## 2019-08-26 DIAGNOSIS — N186 End stage renal disease: Secondary | ICD-10-CM | POA: Diagnosis not present

## 2019-08-27 DIAGNOSIS — D631 Anemia in chronic kidney disease: Secondary | ICD-10-CM | POA: Diagnosis not present

## 2019-08-27 DIAGNOSIS — N186 End stage renal disease: Secondary | ICD-10-CM | POA: Diagnosis not present

## 2019-08-27 DIAGNOSIS — N2581 Secondary hyperparathyroidism of renal origin: Secondary | ICD-10-CM | POA: Diagnosis not present

## 2019-08-28 DIAGNOSIS — N186 End stage renal disease: Secondary | ICD-10-CM | POA: Diagnosis not present

## 2019-08-28 DIAGNOSIS — N2581 Secondary hyperparathyroidism of renal origin: Secondary | ICD-10-CM | POA: Diagnosis not present

## 2019-08-28 DIAGNOSIS — D631 Anemia in chronic kidney disease: Secondary | ICD-10-CM | POA: Diagnosis not present

## 2019-08-29 DIAGNOSIS — N2581 Secondary hyperparathyroidism of renal origin: Secondary | ICD-10-CM | POA: Diagnosis not present

## 2019-08-29 DIAGNOSIS — N186 End stage renal disease: Secondary | ICD-10-CM | POA: Diagnosis not present

## 2019-08-29 DIAGNOSIS — D631 Anemia in chronic kidney disease: Secondary | ICD-10-CM | POA: Diagnosis not present

## 2019-08-30 DIAGNOSIS — D631 Anemia in chronic kidney disease: Secondary | ICD-10-CM | POA: Diagnosis not present

## 2019-08-30 DIAGNOSIS — N2581 Secondary hyperparathyroidism of renal origin: Secondary | ICD-10-CM | POA: Diagnosis not present

## 2019-08-30 DIAGNOSIS — N186 End stage renal disease: Secondary | ICD-10-CM | POA: Diagnosis not present

## 2019-08-31 DIAGNOSIS — N186 End stage renal disease: Secondary | ICD-10-CM | POA: Diagnosis not present

## 2019-08-31 DIAGNOSIS — N2581 Secondary hyperparathyroidism of renal origin: Secondary | ICD-10-CM | POA: Diagnosis not present

## 2019-08-31 DIAGNOSIS — D631 Anemia in chronic kidney disease: Secondary | ICD-10-CM | POA: Diagnosis not present

## 2019-09-01 DIAGNOSIS — N186 End stage renal disease: Secondary | ICD-10-CM | POA: Diagnosis not present

## 2019-09-01 DIAGNOSIS — N2581 Secondary hyperparathyroidism of renal origin: Secondary | ICD-10-CM | POA: Diagnosis not present

## 2019-09-01 DIAGNOSIS — D631 Anemia in chronic kidney disease: Secondary | ICD-10-CM | POA: Diagnosis not present

## 2019-09-01 DIAGNOSIS — H2511 Age-related nuclear cataract, right eye: Secondary | ICD-10-CM | POA: Diagnosis not present

## 2019-09-01 DIAGNOSIS — Z961 Presence of intraocular lens: Secondary | ICD-10-CM | POA: Diagnosis not present

## 2019-09-01 DIAGNOSIS — H25012 Cortical age-related cataract, left eye: Secondary | ICD-10-CM | POA: Diagnosis not present

## 2019-09-01 DIAGNOSIS — H2512 Age-related nuclear cataract, left eye: Secondary | ICD-10-CM | POA: Diagnosis not present

## 2019-09-02 DIAGNOSIS — N2581 Secondary hyperparathyroidism of renal origin: Secondary | ICD-10-CM | POA: Diagnosis not present

## 2019-09-02 DIAGNOSIS — N186 End stage renal disease: Secondary | ICD-10-CM | POA: Diagnosis not present

## 2019-09-02 DIAGNOSIS — D631 Anemia in chronic kidney disease: Secondary | ICD-10-CM | POA: Diagnosis not present

## 2019-09-03 DIAGNOSIS — D631 Anemia in chronic kidney disease: Secondary | ICD-10-CM | POA: Diagnosis not present

## 2019-09-03 DIAGNOSIS — N2581 Secondary hyperparathyroidism of renal origin: Secondary | ICD-10-CM | POA: Diagnosis not present

## 2019-09-03 DIAGNOSIS — N186 End stage renal disease: Secondary | ICD-10-CM | POA: Diagnosis not present

## 2019-09-04 DIAGNOSIS — D631 Anemia in chronic kidney disease: Secondary | ICD-10-CM | POA: Diagnosis not present

## 2019-09-04 DIAGNOSIS — N2581 Secondary hyperparathyroidism of renal origin: Secondary | ICD-10-CM | POA: Diagnosis not present

## 2019-09-04 DIAGNOSIS — N186 End stage renal disease: Secondary | ICD-10-CM | POA: Diagnosis not present

## 2019-09-05 DIAGNOSIS — N2581 Secondary hyperparathyroidism of renal origin: Secondary | ICD-10-CM | POA: Diagnosis not present

## 2019-09-05 DIAGNOSIS — N186 End stage renal disease: Secondary | ICD-10-CM | POA: Diagnosis not present

## 2019-09-05 DIAGNOSIS — D631 Anemia in chronic kidney disease: Secondary | ICD-10-CM | POA: Diagnosis not present

## 2019-09-06 DIAGNOSIS — N2581 Secondary hyperparathyroidism of renal origin: Secondary | ICD-10-CM | POA: Diagnosis not present

## 2019-09-06 DIAGNOSIS — D631 Anemia in chronic kidney disease: Secondary | ICD-10-CM | POA: Diagnosis not present

## 2019-09-06 DIAGNOSIS — M25561 Pain in right knee: Secondary | ICD-10-CM | POA: Diagnosis not present

## 2019-09-06 DIAGNOSIS — N186 End stage renal disease: Secondary | ICD-10-CM | POA: Diagnosis not present

## 2019-09-07 DIAGNOSIS — N2581 Secondary hyperparathyroidism of renal origin: Secondary | ICD-10-CM | POA: Diagnosis not present

## 2019-09-07 DIAGNOSIS — N186 End stage renal disease: Secondary | ICD-10-CM | POA: Diagnosis not present

## 2019-09-07 DIAGNOSIS — D631 Anemia in chronic kidney disease: Secondary | ICD-10-CM | POA: Diagnosis not present

## 2019-09-07 DIAGNOSIS — E118 Type 2 diabetes mellitus with unspecified complications: Secondary | ICD-10-CM | POA: Diagnosis not present

## 2019-09-08 DIAGNOSIS — N2581 Secondary hyperparathyroidism of renal origin: Secondary | ICD-10-CM | POA: Diagnosis not present

## 2019-09-08 DIAGNOSIS — N186 End stage renal disease: Secondary | ICD-10-CM | POA: Diagnosis not present

## 2019-09-08 DIAGNOSIS — D631 Anemia in chronic kidney disease: Secondary | ICD-10-CM | POA: Diagnosis not present

## 2019-09-09 DIAGNOSIS — D631 Anemia in chronic kidney disease: Secondary | ICD-10-CM | POA: Diagnosis not present

## 2019-09-09 DIAGNOSIS — N186 End stage renal disease: Secondary | ICD-10-CM | POA: Diagnosis not present

## 2019-09-09 DIAGNOSIS — N2581 Secondary hyperparathyroidism of renal origin: Secondary | ICD-10-CM | POA: Diagnosis not present

## 2019-09-10 DIAGNOSIS — N2581 Secondary hyperparathyroidism of renal origin: Secondary | ICD-10-CM | POA: Diagnosis not present

## 2019-09-10 DIAGNOSIS — N186 End stage renal disease: Secondary | ICD-10-CM | POA: Diagnosis not present

## 2019-09-10 DIAGNOSIS — D631 Anemia in chronic kidney disease: Secondary | ICD-10-CM | POA: Diagnosis not present

## 2019-09-11 DIAGNOSIS — N2581 Secondary hyperparathyroidism of renal origin: Secondary | ICD-10-CM | POA: Diagnosis not present

## 2019-09-11 DIAGNOSIS — D631 Anemia in chronic kidney disease: Secondary | ICD-10-CM | POA: Diagnosis not present

## 2019-09-11 DIAGNOSIS — N186 End stage renal disease: Secondary | ICD-10-CM | POA: Diagnosis not present

## 2019-09-12 DIAGNOSIS — N186 End stage renal disease: Secondary | ICD-10-CM | POA: Diagnosis not present

## 2019-09-12 DIAGNOSIS — N2581 Secondary hyperparathyroidism of renal origin: Secondary | ICD-10-CM | POA: Diagnosis not present

## 2019-09-12 DIAGNOSIS — D631 Anemia in chronic kidney disease: Secondary | ICD-10-CM | POA: Diagnosis not present

## 2019-09-13 DIAGNOSIS — N186 End stage renal disease: Secondary | ICD-10-CM | POA: Diagnosis not present

## 2019-09-13 DIAGNOSIS — D631 Anemia in chronic kidney disease: Secondary | ICD-10-CM | POA: Diagnosis not present

## 2019-09-13 DIAGNOSIS — N2581 Secondary hyperparathyroidism of renal origin: Secondary | ICD-10-CM | POA: Diagnosis not present

## 2019-09-14 DIAGNOSIS — N186 End stage renal disease: Secondary | ICD-10-CM | POA: Diagnosis not present

## 2019-09-14 DIAGNOSIS — N2581 Secondary hyperparathyroidism of renal origin: Secondary | ICD-10-CM | POA: Diagnosis not present

## 2019-09-14 DIAGNOSIS — D631 Anemia in chronic kidney disease: Secondary | ICD-10-CM | POA: Diagnosis not present

## 2019-09-15 DIAGNOSIS — D631 Anemia in chronic kidney disease: Secondary | ICD-10-CM | POA: Diagnosis not present

## 2019-09-15 DIAGNOSIS — N186 End stage renal disease: Secondary | ICD-10-CM | POA: Diagnosis not present

## 2019-09-15 DIAGNOSIS — M25561 Pain in right knee: Secondary | ICD-10-CM | POA: Diagnosis not present

## 2019-09-15 DIAGNOSIS — N2581 Secondary hyperparathyroidism of renal origin: Secondary | ICD-10-CM | POA: Diagnosis not present

## 2019-09-16 DIAGNOSIS — D631 Anemia in chronic kidney disease: Secondary | ICD-10-CM | POA: Diagnosis not present

## 2019-09-16 DIAGNOSIS — N2581 Secondary hyperparathyroidism of renal origin: Secondary | ICD-10-CM | POA: Diagnosis not present

## 2019-09-16 DIAGNOSIS — N186 End stage renal disease: Secondary | ICD-10-CM | POA: Diagnosis not present

## 2019-09-17 DIAGNOSIS — N2581 Secondary hyperparathyroidism of renal origin: Secondary | ICD-10-CM | POA: Diagnosis not present

## 2019-09-17 DIAGNOSIS — N186 End stage renal disease: Secondary | ICD-10-CM | POA: Diagnosis not present

## 2019-09-17 DIAGNOSIS — D631 Anemia in chronic kidney disease: Secondary | ICD-10-CM | POA: Diagnosis not present

## 2019-09-18 DIAGNOSIS — N2581 Secondary hyperparathyroidism of renal origin: Secondary | ICD-10-CM | POA: Diagnosis not present

## 2019-09-18 DIAGNOSIS — D631 Anemia in chronic kidney disease: Secondary | ICD-10-CM | POA: Diagnosis not present

## 2019-09-18 DIAGNOSIS — N186 End stage renal disease: Secondary | ICD-10-CM | POA: Diagnosis not present

## 2019-09-19 DIAGNOSIS — N2581 Secondary hyperparathyroidism of renal origin: Secondary | ICD-10-CM | POA: Diagnosis not present

## 2019-09-19 DIAGNOSIS — N186 End stage renal disease: Secondary | ICD-10-CM | POA: Diagnosis not present

## 2019-09-19 DIAGNOSIS — D631 Anemia in chronic kidney disease: Secondary | ICD-10-CM | POA: Diagnosis not present

## 2019-09-20 DIAGNOSIS — D631 Anemia in chronic kidney disease: Secondary | ICD-10-CM | POA: Diagnosis not present

## 2019-09-20 DIAGNOSIS — N2581 Secondary hyperparathyroidism of renal origin: Secondary | ICD-10-CM | POA: Diagnosis not present

## 2019-09-20 DIAGNOSIS — N186 End stage renal disease: Secondary | ICD-10-CM | POA: Diagnosis not present

## 2019-09-21 DIAGNOSIS — N2581 Secondary hyperparathyroidism of renal origin: Secondary | ICD-10-CM | POA: Diagnosis not present

## 2019-09-21 DIAGNOSIS — D631 Anemia in chronic kidney disease: Secondary | ICD-10-CM | POA: Diagnosis not present

## 2019-09-21 DIAGNOSIS — N186 End stage renal disease: Secondary | ICD-10-CM | POA: Diagnosis not present

## 2019-09-22 DIAGNOSIS — N2581 Secondary hyperparathyroidism of renal origin: Secondary | ICD-10-CM | POA: Diagnosis not present

## 2019-09-22 DIAGNOSIS — M25561 Pain in right knee: Secondary | ICD-10-CM | POA: Diagnosis not present

## 2019-09-22 DIAGNOSIS — N186 End stage renal disease: Secondary | ICD-10-CM | POA: Diagnosis not present

## 2019-09-22 DIAGNOSIS — D631 Anemia in chronic kidney disease: Secondary | ICD-10-CM | POA: Diagnosis not present

## 2019-09-23 DIAGNOSIS — D631 Anemia in chronic kidney disease: Secondary | ICD-10-CM | POA: Diagnosis not present

## 2019-09-23 DIAGNOSIS — N186 End stage renal disease: Secondary | ICD-10-CM | POA: Diagnosis not present

## 2019-09-23 DIAGNOSIS — N2581 Secondary hyperparathyroidism of renal origin: Secondary | ICD-10-CM | POA: Diagnosis not present

## 2019-09-24 DIAGNOSIS — D631 Anemia in chronic kidney disease: Secondary | ICD-10-CM | POA: Diagnosis not present

## 2019-09-24 DIAGNOSIS — N2581 Secondary hyperparathyroidism of renal origin: Secondary | ICD-10-CM | POA: Diagnosis not present

## 2019-09-24 DIAGNOSIS — Z992 Dependence on renal dialysis: Secondary | ICD-10-CM | POA: Diagnosis not present

## 2019-09-24 DIAGNOSIS — N186 End stage renal disease: Secondary | ICD-10-CM | POA: Diagnosis not present

## 2019-09-25 DIAGNOSIS — N2581 Secondary hyperparathyroidism of renal origin: Secondary | ICD-10-CM | POA: Diagnosis not present

## 2019-09-25 DIAGNOSIS — D631 Anemia in chronic kidney disease: Secondary | ICD-10-CM | POA: Diagnosis not present

## 2019-09-25 DIAGNOSIS — N186 End stage renal disease: Secondary | ICD-10-CM | POA: Diagnosis not present

## 2019-09-26 ENCOUNTER — Other Ambulatory Visit: Payer: Self-pay

## 2019-09-26 ENCOUNTER — Ambulatory Visit (INDEPENDENT_AMBULATORY_CARE_PROVIDER_SITE_OTHER): Payer: Medicare Other

## 2019-09-26 DIAGNOSIS — D631 Anemia in chronic kidney disease: Secondary | ICD-10-CM | POA: Diagnosis not present

## 2019-09-26 DIAGNOSIS — G9589 Other specified diseases of spinal cord: Secondary | ICD-10-CM | POA: Diagnosis not present

## 2019-09-26 DIAGNOSIS — R531 Weakness: Secondary | ICD-10-CM

## 2019-09-26 DIAGNOSIS — M4319 Spondylolisthesis, multiple sites in spine: Secondary | ICD-10-CM | POA: Diagnosis not present

## 2019-09-26 DIAGNOSIS — N186 End stage renal disease: Secondary | ICD-10-CM | POA: Diagnosis not present

## 2019-09-26 DIAGNOSIS — G959 Disease of spinal cord, unspecified: Secondary | ICD-10-CM | POA: Diagnosis not present

## 2019-09-26 DIAGNOSIS — N2581 Secondary hyperparathyroidism of renal origin: Secondary | ICD-10-CM | POA: Diagnosis not present

## 2019-09-26 DIAGNOSIS — M5013 Cervical disc disorder with radiculopathy, cervicothoracic region: Secondary | ICD-10-CM | POA: Diagnosis not present

## 2019-09-26 DIAGNOSIS — M4722 Other spondylosis with radiculopathy, cervical region: Secondary | ICD-10-CM | POA: Diagnosis not present

## 2019-09-26 IMAGING — MR MR CERVICAL SPINE W/O CM
5 series · 40 of 48 positions shown · non-contrast
Comparison: Plain film [DATE]

CLINICAL DATA: Myelopathy, acute of progressive.

EXAM:
MRI CERVICAL SPINE WITHOUT CONTRAST
TECHNIQUE: Multiplanar, multisequence MR imaging of the cervical spine was
performed. No intravenous contrast was administered.

[Series 3: T2 · sagittal · 3.0mm · 0.69mm/px · 6 of 13 slices shown (1 of 2)]
[im 1/13]
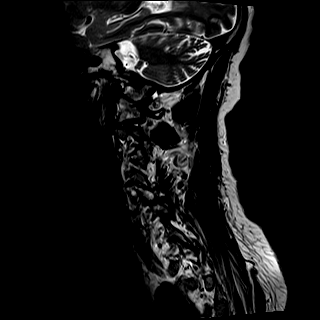
[im 3/13]
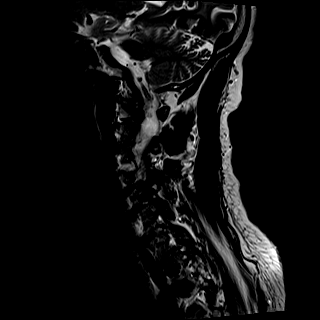
[im 5/13]
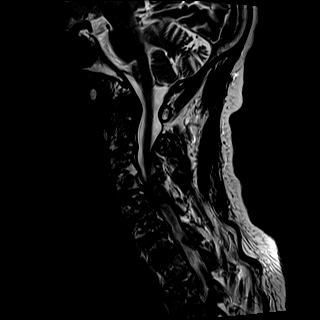
[im 8/13]
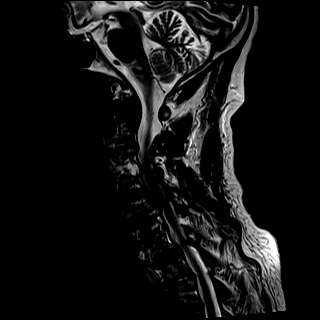
[im 10/13]
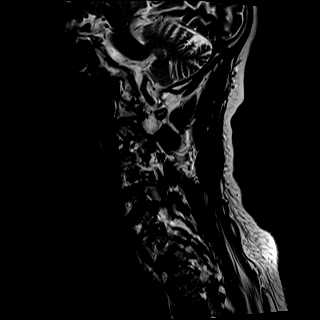
[im 13/13]
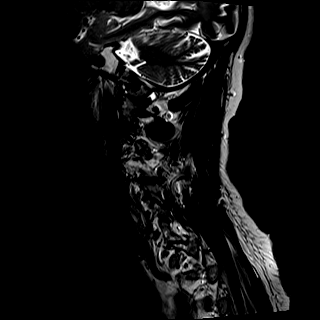

[Series 4: T1 · sagittal · 3.0mm · 0.86mm/px · 7 of 13 slices shown]
[im 1/13]
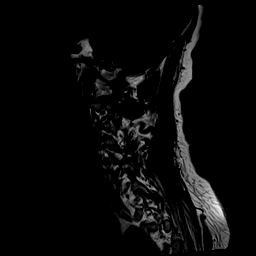
[im 3/13]
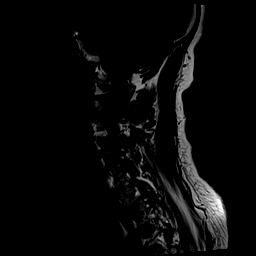
[im 5/13]
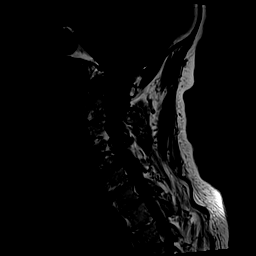
[im 7/13]
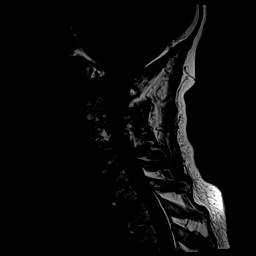
[im 9/13]
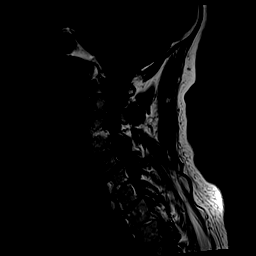
[im 11/13]
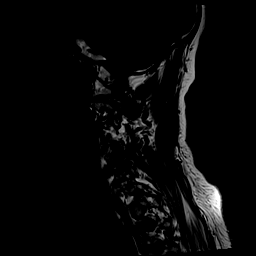
[im 13/13]
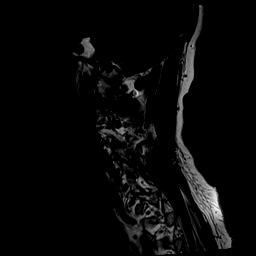

[Series 5: STIR · sagittal · 3.0mm · 0.69mm/px · 7 of 13 slices shown]
[im 1/13]
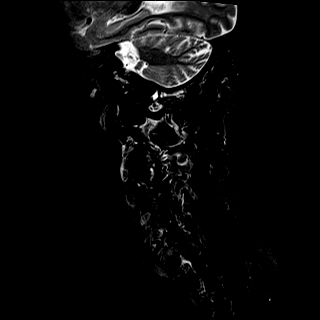
[im 3/13]
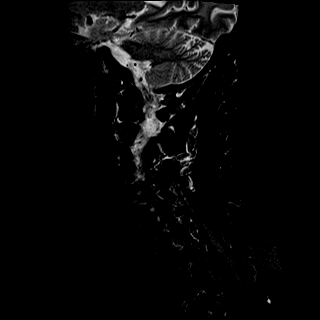
[im 5/13]
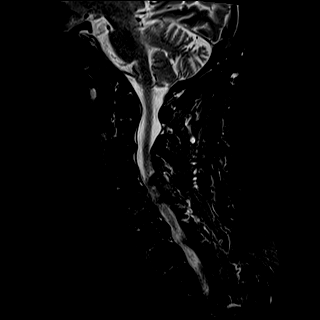
[im 7/13]
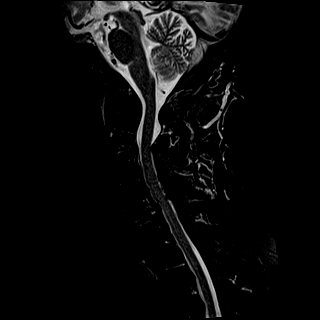
[im 9/13]
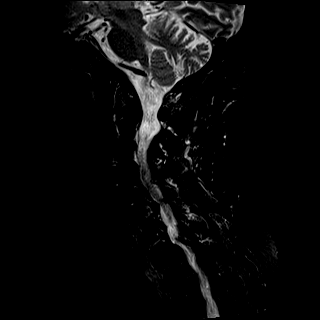
[im 11/13]
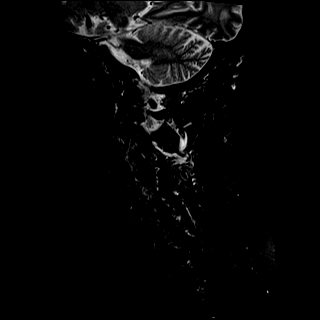
[im 13/13]
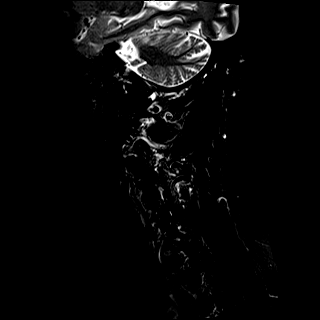

[Series 6: T2 · axial · 3.0mm · 0.62mm/px · z∈[-93,+5]mm · 12 of 27 slices shown (2 of 2)]
[im 1/27]
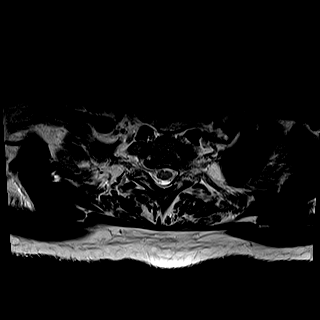
[im 3/27]
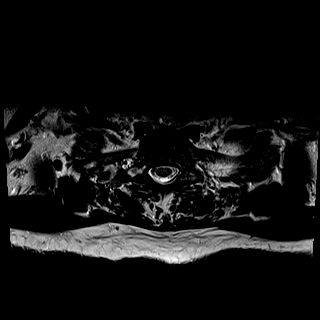
[im 5/27]
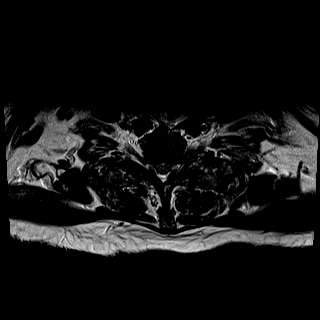
[im 7/27]
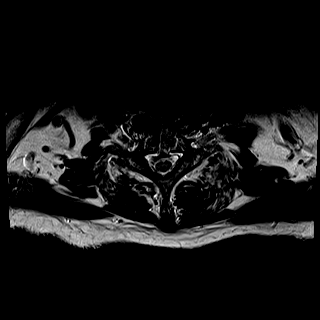
[im 9/27]
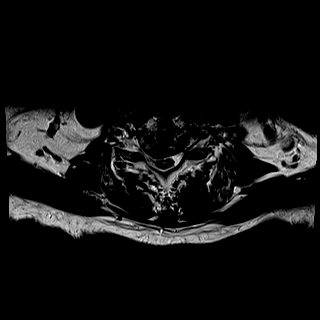
[im 11/27]
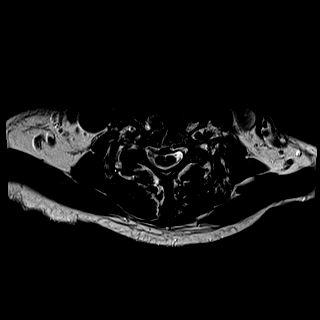
[im 13/27]
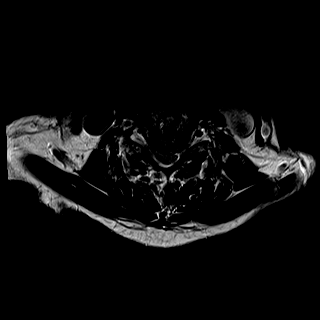
[im 15/27]
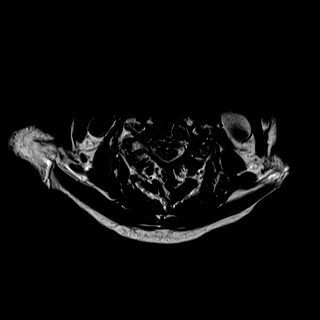
[im 17/27]
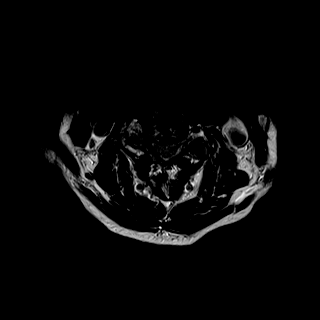
[im 19/27]
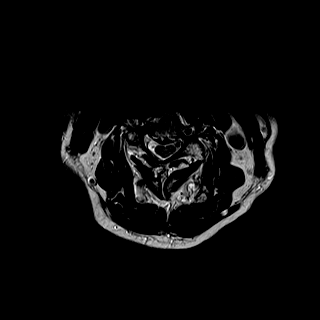
[im 23/27]
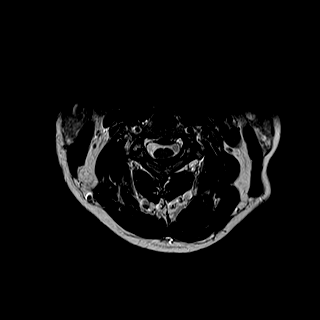
[im 27/27]
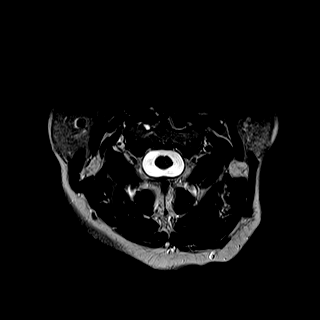

[Series 7: mpgr ax · axial · 3.0mm · 0.35mm/px · z∈[-84,+13]mm · 8 of 27 slices shown]
[im 1/27]
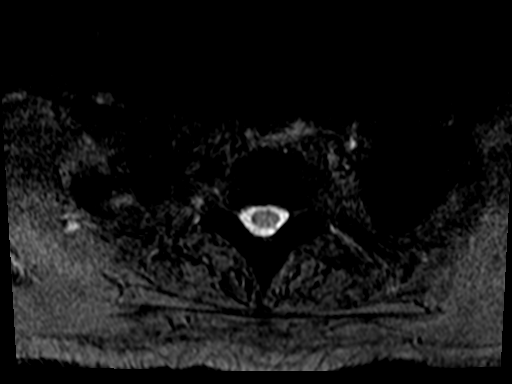
[im 5/27]
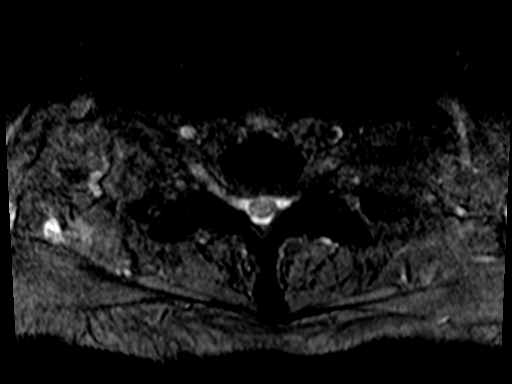
[im 9/27]
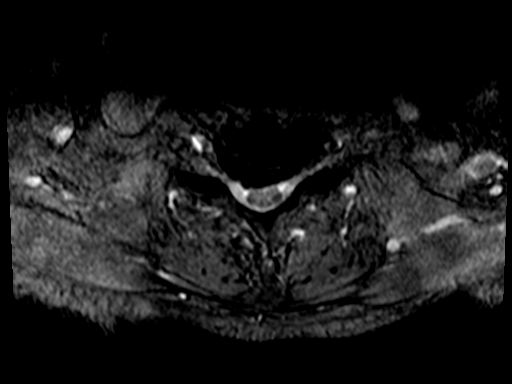
[im 13/27]
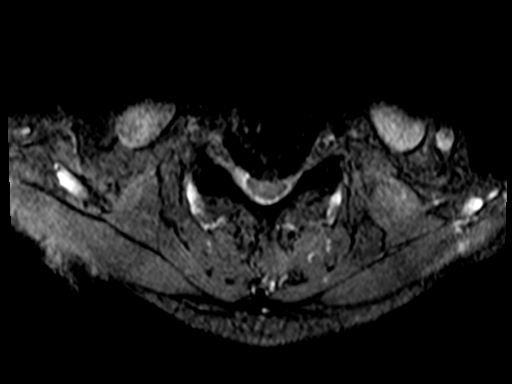
[im 15/27]
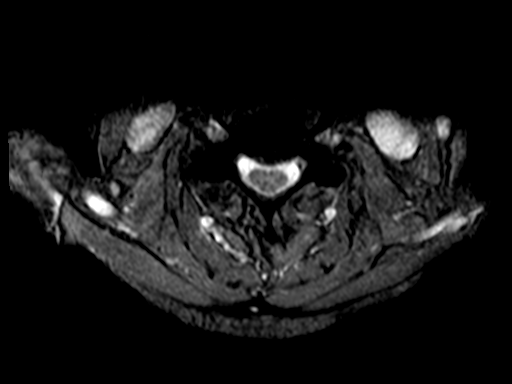
[im 19/27]
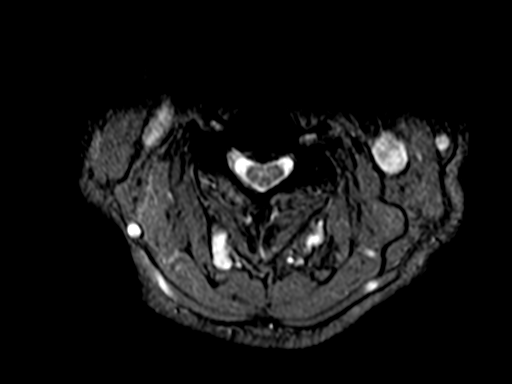
[im 23/27]
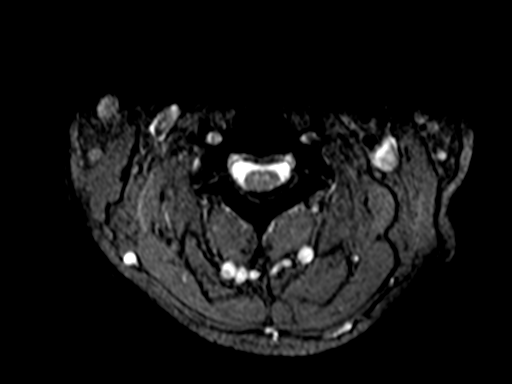
[im 27/27]
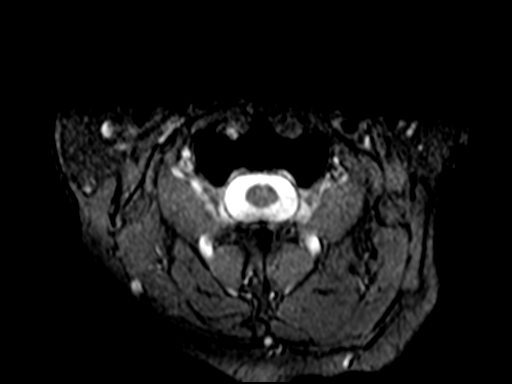

[40 of 48 positions shown; findings below may reference images not displayed]

FINDINGS: Alignment: Minimal anterolisthesis of C5 over C6 and C7 over T1.

Vertebrae: No fracture, evidence of discitis, or bone lesion.

Cord: Flattening of the cord at C4-5, C5-6 and C6-7. No cord signal
abnormality.

Posterior Fossa, vertebral arteries, paraspinal tissues: Negative.

Disc levels:

C2-3: Posterior disc osteophyte complex and ligamentum flavum
redundancy resulting in mild spinal canal stenosis and mild left
neural foraminal narrowing.

C3-4: Left asymmetric posterior disc osteophyte complex resulting in
mild spinal canal stenosis. Uncovertebral and facet degenerative
changes resulting in moderate right and severe left neural foraminal
narrowing.

C4-5: Posterior disc protrusion and ligamentum flavum redundancy
resulting in moderate spinal canal stenosis. Uncovertebral and facet
degenerative changes resulting in severe right and moderate left
neural foraminal narrowing.

C5-6: Left asymmetric posterior disc osteophyte complex and
ligamentum flavum redundancy resulting in moderate spinal canal
stenosis. Uncovertebral and facet degenerative changes resulting in
mild right and moderate to severe left neural foraminal narrowing.

C6-7: Right asymmetric posterior disc protrusion resulting in mild
spinal canal stenosis. Uncovertebral and facet degenerative changes
resulting in mild-to-moderate bilateral neural foraminal narrowing.

C7-T1: Posterior disc protrusion resulting in mild spinal canal
stenosis. Uncovertebral and facet degenerative changes resulting in
mild right and moderate left neural foraminal narrowing.
IMPRESSION: 1. Multilevel cervical spondylosis as described above, with moderate
spinal canal stenosis at C4-5 and C5-6.
2. Multilevel neural foraminal narrowing, severe on the left at C3-4
and on the right at C4-5. Moderate to severe left neural foraminal
narrowing at C5-6.
3. Flattening of the cord at C4-5, C5-6 and C6-7 without cord signal
abnormality.

## 2019-09-27 DIAGNOSIS — N2581 Secondary hyperparathyroidism of renal origin: Secondary | ICD-10-CM | POA: Diagnosis not present

## 2019-09-27 DIAGNOSIS — N186 End stage renal disease: Secondary | ICD-10-CM | POA: Diagnosis not present

## 2019-09-27 DIAGNOSIS — D631 Anemia in chronic kidney disease: Secondary | ICD-10-CM | POA: Diagnosis not present

## 2019-09-28 DIAGNOSIS — N186 End stage renal disease: Secondary | ICD-10-CM | POA: Diagnosis not present

## 2019-09-28 DIAGNOSIS — N2581 Secondary hyperparathyroidism of renal origin: Secondary | ICD-10-CM | POA: Diagnosis not present

## 2019-09-28 DIAGNOSIS — D631 Anemia in chronic kidney disease: Secondary | ICD-10-CM | POA: Diagnosis not present

## 2019-09-29 DIAGNOSIS — M25561 Pain in right knee: Secondary | ICD-10-CM | POA: Diagnosis not present

## 2019-09-29 DIAGNOSIS — N2581 Secondary hyperparathyroidism of renal origin: Secondary | ICD-10-CM | POA: Diagnosis not present

## 2019-09-29 DIAGNOSIS — D631 Anemia in chronic kidney disease: Secondary | ICD-10-CM | POA: Diagnosis not present

## 2019-09-29 DIAGNOSIS — N186 End stage renal disease: Secondary | ICD-10-CM | POA: Diagnosis not present

## 2019-09-30 DIAGNOSIS — N2581 Secondary hyperparathyroidism of renal origin: Secondary | ICD-10-CM | POA: Diagnosis not present

## 2019-09-30 DIAGNOSIS — N186 End stage renal disease: Secondary | ICD-10-CM | POA: Diagnosis not present

## 2019-09-30 DIAGNOSIS — D631 Anemia in chronic kidney disease: Secondary | ICD-10-CM | POA: Diagnosis not present

## 2019-10-01 DIAGNOSIS — D631 Anemia in chronic kidney disease: Secondary | ICD-10-CM | POA: Diagnosis not present

## 2019-10-01 DIAGNOSIS — N186 End stage renal disease: Secondary | ICD-10-CM | POA: Diagnosis not present

## 2019-10-01 DIAGNOSIS — N2581 Secondary hyperparathyroidism of renal origin: Secondary | ICD-10-CM | POA: Diagnosis not present

## 2019-10-02 DIAGNOSIS — N2581 Secondary hyperparathyroidism of renal origin: Secondary | ICD-10-CM | POA: Diagnosis not present

## 2019-10-02 DIAGNOSIS — D631 Anemia in chronic kidney disease: Secondary | ICD-10-CM | POA: Diagnosis not present

## 2019-10-02 DIAGNOSIS — N186 End stage renal disease: Secondary | ICD-10-CM | POA: Diagnosis not present

## 2019-10-03 DIAGNOSIS — N186 End stage renal disease: Secondary | ICD-10-CM | POA: Diagnosis not present

## 2019-10-03 DIAGNOSIS — N2581 Secondary hyperparathyroidism of renal origin: Secondary | ICD-10-CM | POA: Diagnosis not present

## 2019-10-03 DIAGNOSIS — D631 Anemia in chronic kidney disease: Secondary | ICD-10-CM | POA: Diagnosis not present

## 2019-10-04 DIAGNOSIS — N186 End stage renal disease: Secondary | ICD-10-CM | POA: Diagnosis not present

## 2019-10-04 DIAGNOSIS — D631 Anemia in chronic kidney disease: Secondary | ICD-10-CM | POA: Diagnosis not present

## 2019-10-04 DIAGNOSIS — N2581 Secondary hyperparathyroidism of renal origin: Secondary | ICD-10-CM | POA: Diagnosis not present

## 2019-10-05 DIAGNOSIS — N186 End stage renal disease: Secondary | ICD-10-CM | POA: Diagnosis not present

## 2019-10-05 DIAGNOSIS — E118 Type 2 diabetes mellitus with unspecified complications: Secondary | ICD-10-CM | POA: Diagnosis not present

## 2019-10-05 DIAGNOSIS — N2581 Secondary hyperparathyroidism of renal origin: Secondary | ICD-10-CM | POA: Diagnosis not present

## 2019-10-05 DIAGNOSIS — D631 Anemia in chronic kidney disease: Secondary | ICD-10-CM | POA: Diagnosis not present

## 2019-10-06 DIAGNOSIS — M25561 Pain in right knee: Secondary | ICD-10-CM | POA: Diagnosis not present

## 2019-10-06 DIAGNOSIS — N2581 Secondary hyperparathyroidism of renal origin: Secondary | ICD-10-CM | POA: Diagnosis not present

## 2019-10-06 DIAGNOSIS — D631 Anemia in chronic kidney disease: Secondary | ICD-10-CM | POA: Diagnosis not present

## 2019-10-06 DIAGNOSIS — N186 End stage renal disease: Secondary | ICD-10-CM | POA: Diagnosis not present

## 2019-10-07 DIAGNOSIS — N2581 Secondary hyperparathyroidism of renal origin: Secondary | ICD-10-CM | POA: Diagnosis not present

## 2019-10-07 DIAGNOSIS — N186 End stage renal disease: Secondary | ICD-10-CM | POA: Diagnosis not present

## 2019-10-07 DIAGNOSIS — D631 Anemia in chronic kidney disease: Secondary | ICD-10-CM | POA: Diagnosis not present

## 2019-10-08 DIAGNOSIS — D631 Anemia in chronic kidney disease: Secondary | ICD-10-CM | POA: Diagnosis not present

## 2019-10-08 DIAGNOSIS — N186 End stage renal disease: Secondary | ICD-10-CM | POA: Diagnosis not present

## 2019-10-08 DIAGNOSIS — N2581 Secondary hyperparathyroidism of renal origin: Secondary | ICD-10-CM | POA: Diagnosis not present

## 2019-10-09 DIAGNOSIS — D631 Anemia in chronic kidney disease: Secondary | ICD-10-CM | POA: Diagnosis not present

## 2019-10-09 DIAGNOSIS — N2581 Secondary hyperparathyroidism of renal origin: Secondary | ICD-10-CM | POA: Diagnosis not present

## 2019-10-09 DIAGNOSIS — N186 End stage renal disease: Secondary | ICD-10-CM | POA: Diagnosis not present

## 2019-10-10 DIAGNOSIS — N2581 Secondary hyperparathyroidism of renal origin: Secondary | ICD-10-CM | POA: Diagnosis not present

## 2019-10-10 DIAGNOSIS — N186 End stage renal disease: Secondary | ICD-10-CM | POA: Diagnosis not present

## 2019-10-10 DIAGNOSIS — D631 Anemia in chronic kidney disease: Secondary | ICD-10-CM | POA: Diagnosis not present

## 2019-10-11 DIAGNOSIS — D631 Anemia in chronic kidney disease: Secondary | ICD-10-CM | POA: Diagnosis not present

## 2019-10-11 DIAGNOSIS — N186 End stage renal disease: Secondary | ICD-10-CM | POA: Diagnosis not present

## 2019-10-11 DIAGNOSIS — N2581 Secondary hyperparathyroidism of renal origin: Secondary | ICD-10-CM | POA: Diagnosis not present

## 2019-10-12 DIAGNOSIS — N186 End stage renal disease: Secondary | ICD-10-CM | POA: Diagnosis not present

## 2019-10-12 DIAGNOSIS — N2581 Secondary hyperparathyroidism of renal origin: Secondary | ICD-10-CM | POA: Diagnosis not present

## 2019-10-12 DIAGNOSIS — G8929 Other chronic pain: Secondary | ICD-10-CM | POA: Diagnosis not present

## 2019-10-12 DIAGNOSIS — D631 Anemia in chronic kidney disease: Secondary | ICD-10-CM | POA: Diagnosis not present

## 2019-10-12 DIAGNOSIS — M25562 Pain in left knee: Secondary | ICD-10-CM | POA: Diagnosis not present

## 2019-10-12 DIAGNOSIS — M170 Bilateral primary osteoarthritis of knee: Secondary | ICD-10-CM | POA: Diagnosis not present

## 2019-10-12 DIAGNOSIS — N185 Chronic kidney disease, stage 5: Secondary | ICD-10-CM | POA: Diagnosis not present

## 2019-10-12 DIAGNOSIS — M25561 Pain in right knee: Secondary | ICD-10-CM | POA: Diagnosis not present

## 2019-10-13 DIAGNOSIS — D631 Anemia in chronic kidney disease: Secondary | ICD-10-CM | POA: Diagnosis not present

## 2019-10-13 DIAGNOSIS — N186 End stage renal disease: Secondary | ICD-10-CM | POA: Diagnosis not present

## 2019-10-13 DIAGNOSIS — N2581 Secondary hyperparathyroidism of renal origin: Secondary | ICD-10-CM | POA: Diagnosis not present

## 2019-10-14 DIAGNOSIS — D631 Anemia in chronic kidney disease: Secondary | ICD-10-CM | POA: Diagnosis not present

## 2019-10-14 DIAGNOSIS — N2581 Secondary hyperparathyroidism of renal origin: Secondary | ICD-10-CM | POA: Diagnosis not present

## 2019-10-14 DIAGNOSIS — N186 End stage renal disease: Secondary | ICD-10-CM | POA: Diagnosis not present

## 2019-10-15 DIAGNOSIS — N2581 Secondary hyperparathyroidism of renal origin: Secondary | ICD-10-CM | POA: Diagnosis not present

## 2019-10-15 DIAGNOSIS — D631 Anemia in chronic kidney disease: Secondary | ICD-10-CM | POA: Diagnosis not present

## 2019-10-15 DIAGNOSIS — N186 End stage renal disease: Secondary | ICD-10-CM | POA: Diagnosis not present

## 2019-10-16 DIAGNOSIS — N2581 Secondary hyperparathyroidism of renal origin: Secondary | ICD-10-CM | POA: Diagnosis not present

## 2019-10-16 DIAGNOSIS — N186 End stage renal disease: Secondary | ICD-10-CM | POA: Diagnosis not present

## 2019-10-16 DIAGNOSIS — D631 Anemia in chronic kidney disease: Secondary | ICD-10-CM | POA: Diagnosis not present

## 2019-10-17 DIAGNOSIS — N186 End stage renal disease: Secondary | ICD-10-CM | POA: Diagnosis not present

## 2019-10-17 DIAGNOSIS — D631 Anemia in chronic kidney disease: Secondary | ICD-10-CM | POA: Diagnosis not present

## 2019-10-17 DIAGNOSIS — N2581 Secondary hyperparathyroidism of renal origin: Secondary | ICD-10-CM | POA: Diagnosis not present

## 2019-10-18 DIAGNOSIS — N2581 Secondary hyperparathyroidism of renal origin: Secondary | ICD-10-CM | POA: Diagnosis not present

## 2019-10-18 DIAGNOSIS — D631 Anemia in chronic kidney disease: Secondary | ICD-10-CM | POA: Diagnosis not present

## 2019-10-18 DIAGNOSIS — N186 End stage renal disease: Secondary | ICD-10-CM | POA: Diagnosis not present

## 2019-10-19 DIAGNOSIS — N186 End stage renal disease: Secondary | ICD-10-CM | POA: Diagnosis not present

## 2019-10-19 DIAGNOSIS — D631 Anemia in chronic kidney disease: Secondary | ICD-10-CM | POA: Diagnosis not present

## 2019-10-19 DIAGNOSIS — N2581 Secondary hyperparathyroidism of renal origin: Secondary | ICD-10-CM | POA: Diagnosis not present

## 2019-10-20 DIAGNOSIS — N2581 Secondary hyperparathyroidism of renal origin: Secondary | ICD-10-CM | POA: Diagnosis not present

## 2019-10-20 DIAGNOSIS — N186 End stage renal disease: Secondary | ICD-10-CM | POA: Diagnosis not present

## 2019-10-20 DIAGNOSIS — D631 Anemia in chronic kidney disease: Secondary | ICD-10-CM | POA: Diagnosis not present

## 2019-10-21 DIAGNOSIS — N2581 Secondary hyperparathyroidism of renal origin: Secondary | ICD-10-CM | POA: Diagnosis not present

## 2019-10-21 DIAGNOSIS — N186 End stage renal disease: Secondary | ICD-10-CM | POA: Diagnosis not present

## 2019-10-21 DIAGNOSIS — D631 Anemia in chronic kidney disease: Secondary | ICD-10-CM | POA: Diagnosis not present

## 2019-10-22 DIAGNOSIS — N2581 Secondary hyperparathyroidism of renal origin: Secondary | ICD-10-CM | POA: Diagnosis not present

## 2019-10-22 DIAGNOSIS — D631 Anemia in chronic kidney disease: Secondary | ICD-10-CM | POA: Diagnosis not present

## 2019-10-22 DIAGNOSIS — N186 End stage renal disease: Secondary | ICD-10-CM | POA: Diagnosis not present

## 2019-10-23 DIAGNOSIS — D631 Anemia in chronic kidney disease: Secondary | ICD-10-CM | POA: Diagnosis not present

## 2019-10-23 DIAGNOSIS — N2581 Secondary hyperparathyroidism of renal origin: Secondary | ICD-10-CM | POA: Diagnosis not present

## 2019-10-23 DIAGNOSIS — N186 End stage renal disease: Secondary | ICD-10-CM | POA: Diagnosis not present

## 2019-10-24 DIAGNOSIS — N2581 Secondary hyperparathyroidism of renal origin: Secondary | ICD-10-CM | POA: Diagnosis not present

## 2019-10-24 DIAGNOSIS — D631 Anemia in chronic kidney disease: Secondary | ICD-10-CM | POA: Diagnosis not present

## 2019-10-24 DIAGNOSIS — N186 End stage renal disease: Secondary | ICD-10-CM | POA: Diagnosis not present

## 2019-10-25 DIAGNOSIS — Z992 Dependence on renal dialysis: Secondary | ICD-10-CM | POA: Diagnosis not present

## 2019-10-25 DIAGNOSIS — D631 Anemia in chronic kidney disease: Secondary | ICD-10-CM | POA: Diagnosis not present

## 2019-10-25 DIAGNOSIS — N2581 Secondary hyperparathyroidism of renal origin: Secondary | ICD-10-CM | POA: Diagnosis not present

## 2019-10-25 DIAGNOSIS — N186 End stage renal disease: Secondary | ICD-10-CM | POA: Diagnosis not present

## 2019-10-26 DIAGNOSIS — D509 Iron deficiency anemia, unspecified: Secondary | ICD-10-CM | POA: Diagnosis not present

## 2019-10-26 DIAGNOSIS — N2581 Secondary hyperparathyroidism of renal origin: Secondary | ICD-10-CM | POA: Diagnosis not present

## 2019-10-26 DIAGNOSIS — N186 End stage renal disease: Secondary | ICD-10-CM | POA: Diagnosis not present

## 2019-10-26 DIAGNOSIS — D631 Anemia in chronic kidney disease: Secondary | ICD-10-CM | POA: Diagnosis not present

## 2019-10-27 DIAGNOSIS — D631 Anemia in chronic kidney disease: Secondary | ICD-10-CM | POA: Diagnosis not present

## 2019-10-27 DIAGNOSIS — N186 End stage renal disease: Secondary | ICD-10-CM | POA: Diagnosis not present

## 2019-10-27 DIAGNOSIS — N2581 Secondary hyperparathyroidism of renal origin: Secondary | ICD-10-CM | POA: Diagnosis not present

## 2019-10-27 DIAGNOSIS — D509 Iron deficiency anemia, unspecified: Secondary | ICD-10-CM | POA: Diagnosis not present

## 2019-10-28 DIAGNOSIS — D631 Anemia in chronic kidney disease: Secondary | ICD-10-CM | POA: Diagnosis not present

## 2019-10-28 DIAGNOSIS — N186 End stage renal disease: Secondary | ICD-10-CM | POA: Diagnosis not present

## 2019-10-28 DIAGNOSIS — D509 Iron deficiency anemia, unspecified: Secondary | ICD-10-CM | POA: Diagnosis not present

## 2019-10-28 DIAGNOSIS — N2581 Secondary hyperparathyroidism of renal origin: Secondary | ICD-10-CM | POA: Diagnosis not present

## 2019-10-29 DIAGNOSIS — D509 Iron deficiency anemia, unspecified: Secondary | ICD-10-CM | POA: Diagnosis not present

## 2019-10-29 DIAGNOSIS — D631 Anemia in chronic kidney disease: Secondary | ICD-10-CM | POA: Diagnosis not present

## 2019-10-29 DIAGNOSIS — N186 End stage renal disease: Secondary | ICD-10-CM | POA: Diagnosis not present

## 2019-10-29 DIAGNOSIS — N2581 Secondary hyperparathyroidism of renal origin: Secondary | ICD-10-CM | POA: Diagnosis not present

## 2019-10-30 DIAGNOSIS — D631 Anemia in chronic kidney disease: Secondary | ICD-10-CM | POA: Diagnosis not present

## 2019-10-30 DIAGNOSIS — D509 Iron deficiency anemia, unspecified: Secondary | ICD-10-CM | POA: Diagnosis not present

## 2019-10-30 DIAGNOSIS — N186 End stage renal disease: Secondary | ICD-10-CM | POA: Diagnosis not present

## 2019-10-30 DIAGNOSIS — N2581 Secondary hyperparathyroidism of renal origin: Secondary | ICD-10-CM | POA: Diagnosis not present

## 2019-10-31 DIAGNOSIS — D509 Iron deficiency anemia, unspecified: Secondary | ICD-10-CM | POA: Diagnosis not present

## 2019-10-31 DIAGNOSIS — D631 Anemia in chronic kidney disease: Secondary | ICD-10-CM | POA: Diagnosis not present

## 2019-10-31 DIAGNOSIS — N2581 Secondary hyperparathyroidism of renal origin: Secondary | ICD-10-CM | POA: Diagnosis not present

## 2019-10-31 DIAGNOSIS — N186 End stage renal disease: Secondary | ICD-10-CM | POA: Diagnosis not present

## 2019-11-01 DIAGNOSIS — D509 Iron deficiency anemia, unspecified: Secondary | ICD-10-CM | POA: Diagnosis not present

## 2019-11-01 DIAGNOSIS — D631 Anemia in chronic kidney disease: Secondary | ICD-10-CM | POA: Diagnosis not present

## 2019-11-01 DIAGNOSIS — N186 End stage renal disease: Secondary | ICD-10-CM | POA: Diagnosis not present

## 2019-11-01 DIAGNOSIS — N2581 Secondary hyperparathyroidism of renal origin: Secondary | ICD-10-CM | POA: Diagnosis not present

## 2019-11-02 DIAGNOSIS — D509 Iron deficiency anemia, unspecified: Secondary | ICD-10-CM | POA: Diagnosis not present

## 2019-11-02 DIAGNOSIS — N2581 Secondary hyperparathyroidism of renal origin: Secondary | ICD-10-CM | POA: Diagnosis not present

## 2019-11-02 DIAGNOSIS — N186 End stage renal disease: Secondary | ICD-10-CM | POA: Diagnosis not present

## 2019-11-02 DIAGNOSIS — D631 Anemia in chronic kidney disease: Secondary | ICD-10-CM | POA: Diagnosis not present

## 2019-11-03 DIAGNOSIS — N2581 Secondary hyperparathyroidism of renal origin: Secondary | ICD-10-CM | POA: Diagnosis not present

## 2019-11-03 DIAGNOSIS — N186 End stage renal disease: Secondary | ICD-10-CM | POA: Diagnosis not present

## 2019-11-03 DIAGNOSIS — D631 Anemia in chronic kidney disease: Secondary | ICD-10-CM | POA: Diagnosis not present

## 2019-11-03 DIAGNOSIS — D509 Iron deficiency anemia, unspecified: Secondary | ICD-10-CM | POA: Diagnosis not present

## 2019-11-04 DIAGNOSIS — N2581 Secondary hyperparathyroidism of renal origin: Secondary | ICD-10-CM | POA: Diagnosis not present

## 2019-11-04 DIAGNOSIS — D509 Iron deficiency anemia, unspecified: Secondary | ICD-10-CM | POA: Diagnosis not present

## 2019-11-04 DIAGNOSIS — N186 End stage renal disease: Secondary | ICD-10-CM | POA: Diagnosis not present

## 2019-11-04 DIAGNOSIS — D631 Anemia in chronic kidney disease: Secondary | ICD-10-CM | POA: Diagnosis not present

## 2019-11-05 DIAGNOSIS — D509 Iron deficiency anemia, unspecified: Secondary | ICD-10-CM | POA: Diagnosis not present

## 2019-11-05 DIAGNOSIS — N2581 Secondary hyperparathyroidism of renal origin: Secondary | ICD-10-CM | POA: Diagnosis not present

## 2019-11-05 DIAGNOSIS — D631 Anemia in chronic kidney disease: Secondary | ICD-10-CM | POA: Diagnosis not present

## 2019-11-05 DIAGNOSIS — N186 End stage renal disease: Secondary | ICD-10-CM | POA: Diagnosis not present

## 2019-11-06 DIAGNOSIS — N186 End stage renal disease: Secondary | ICD-10-CM | POA: Diagnosis not present

## 2019-11-06 DIAGNOSIS — D631 Anemia in chronic kidney disease: Secondary | ICD-10-CM | POA: Diagnosis not present

## 2019-11-06 DIAGNOSIS — D509 Iron deficiency anemia, unspecified: Secondary | ICD-10-CM | POA: Diagnosis not present

## 2019-11-06 DIAGNOSIS — N2581 Secondary hyperparathyroidism of renal origin: Secondary | ICD-10-CM | POA: Diagnosis not present

## 2019-11-07 DIAGNOSIS — N2581 Secondary hyperparathyroidism of renal origin: Secondary | ICD-10-CM | POA: Diagnosis not present

## 2019-11-07 DIAGNOSIS — N186 End stage renal disease: Secondary | ICD-10-CM | POA: Diagnosis not present

## 2019-11-07 DIAGNOSIS — D509 Iron deficiency anemia, unspecified: Secondary | ICD-10-CM | POA: Diagnosis not present

## 2019-11-07 DIAGNOSIS — D631 Anemia in chronic kidney disease: Secondary | ICD-10-CM | POA: Diagnosis not present

## 2019-11-08 DIAGNOSIS — D509 Iron deficiency anemia, unspecified: Secondary | ICD-10-CM | POA: Diagnosis not present

## 2019-11-08 DIAGNOSIS — N186 End stage renal disease: Secondary | ICD-10-CM | POA: Diagnosis not present

## 2019-11-08 DIAGNOSIS — D631 Anemia in chronic kidney disease: Secondary | ICD-10-CM | POA: Diagnosis not present

## 2019-11-08 DIAGNOSIS — N2581 Secondary hyperparathyroidism of renal origin: Secondary | ICD-10-CM | POA: Diagnosis not present

## 2019-11-09 DIAGNOSIS — D631 Anemia in chronic kidney disease: Secondary | ICD-10-CM | POA: Diagnosis not present

## 2019-11-09 DIAGNOSIS — N2581 Secondary hyperparathyroidism of renal origin: Secondary | ICD-10-CM | POA: Diagnosis not present

## 2019-11-09 DIAGNOSIS — D509 Iron deficiency anemia, unspecified: Secondary | ICD-10-CM | POA: Diagnosis not present

## 2019-11-09 DIAGNOSIS — N186 End stage renal disease: Secondary | ICD-10-CM | POA: Diagnosis not present

## 2019-11-10 DIAGNOSIS — D509 Iron deficiency anemia, unspecified: Secondary | ICD-10-CM | POA: Diagnosis not present

## 2019-11-10 DIAGNOSIS — N186 End stage renal disease: Secondary | ICD-10-CM | POA: Diagnosis not present

## 2019-11-10 DIAGNOSIS — D631 Anemia in chronic kidney disease: Secondary | ICD-10-CM | POA: Diagnosis not present

## 2019-11-10 DIAGNOSIS — N2581 Secondary hyperparathyroidism of renal origin: Secondary | ICD-10-CM | POA: Diagnosis not present

## 2019-11-11 DIAGNOSIS — N2581 Secondary hyperparathyroidism of renal origin: Secondary | ICD-10-CM | POA: Diagnosis not present

## 2019-11-11 DIAGNOSIS — D509 Iron deficiency anemia, unspecified: Secondary | ICD-10-CM | POA: Diagnosis not present

## 2019-11-11 DIAGNOSIS — N186 End stage renal disease: Secondary | ICD-10-CM | POA: Diagnosis not present

## 2019-11-11 DIAGNOSIS — D631 Anemia in chronic kidney disease: Secondary | ICD-10-CM | POA: Diagnosis not present

## 2019-11-12 DIAGNOSIS — D631 Anemia in chronic kidney disease: Secondary | ICD-10-CM | POA: Diagnosis not present

## 2019-11-12 DIAGNOSIS — D509 Iron deficiency anemia, unspecified: Secondary | ICD-10-CM | POA: Diagnosis not present

## 2019-11-12 DIAGNOSIS — N186 End stage renal disease: Secondary | ICD-10-CM | POA: Diagnosis not present

## 2019-11-12 DIAGNOSIS — N2581 Secondary hyperparathyroidism of renal origin: Secondary | ICD-10-CM | POA: Diagnosis not present

## 2019-11-13 DIAGNOSIS — N186 End stage renal disease: Secondary | ICD-10-CM | POA: Diagnosis not present

## 2019-11-13 DIAGNOSIS — D509 Iron deficiency anemia, unspecified: Secondary | ICD-10-CM | POA: Diagnosis not present

## 2019-11-13 DIAGNOSIS — D631 Anemia in chronic kidney disease: Secondary | ICD-10-CM | POA: Diagnosis not present

## 2019-11-13 DIAGNOSIS — N2581 Secondary hyperparathyroidism of renal origin: Secondary | ICD-10-CM | POA: Diagnosis not present

## 2019-11-14 DIAGNOSIS — N2581 Secondary hyperparathyroidism of renal origin: Secondary | ICD-10-CM | POA: Diagnosis not present

## 2019-11-14 DIAGNOSIS — Z1159 Encounter for screening for other viral diseases: Secondary | ICD-10-CM | POA: Diagnosis not present

## 2019-11-14 DIAGNOSIS — N186 End stage renal disease: Secondary | ICD-10-CM | POA: Diagnosis not present

## 2019-11-14 DIAGNOSIS — Z114 Encounter for screening for human immunodeficiency virus [HIV]: Secondary | ICD-10-CM | POA: Diagnosis not present

## 2019-11-14 DIAGNOSIS — D509 Iron deficiency anemia, unspecified: Secondary | ICD-10-CM | POA: Diagnosis not present

## 2019-11-14 DIAGNOSIS — D631 Anemia in chronic kidney disease: Secondary | ICD-10-CM | POA: Diagnosis not present

## 2019-11-14 DIAGNOSIS — Z4932 Encounter for adequacy testing for peritoneal dialysis: Secondary | ICD-10-CM | POA: Diagnosis not present

## 2019-11-15 DIAGNOSIS — N186 End stage renal disease: Secondary | ICD-10-CM | POA: Diagnosis not present

## 2019-11-15 DIAGNOSIS — D509 Iron deficiency anemia, unspecified: Secondary | ICD-10-CM | POA: Diagnosis not present

## 2019-11-15 DIAGNOSIS — D631 Anemia in chronic kidney disease: Secondary | ICD-10-CM | POA: Diagnosis not present

## 2019-11-15 DIAGNOSIS — N2581 Secondary hyperparathyroidism of renal origin: Secondary | ICD-10-CM | POA: Diagnosis not present

## 2019-11-16 ENCOUNTER — Encounter: Payer: Self-pay | Admitting: Family Medicine

## 2019-11-16 ENCOUNTER — Other Ambulatory Visit: Payer: Self-pay

## 2019-11-16 ENCOUNTER — Ambulatory Visit (INDEPENDENT_AMBULATORY_CARE_PROVIDER_SITE_OTHER): Payer: Medicare Other | Admitting: Family Medicine

## 2019-11-16 DIAGNOSIS — N186 End stage renal disease: Secondary | ICD-10-CM | POA: Diagnosis not present

## 2019-11-16 DIAGNOSIS — N2581 Secondary hyperparathyroidism of renal origin: Secondary | ICD-10-CM | POA: Diagnosis not present

## 2019-11-16 DIAGNOSIS — D509 Iron deficiency anemia, unspecified: Secondary | ICD-10-CM | POA: Diagnosis not present

## 2019-11-16 DIAGNOSIS — K219 Gastro-esophageal reflux disease without esophagitis: Secondary | ICD-10-CM | POA: Diagnosis not present

## 2019-11-16 DIAGNOSIS — D631 Anemia in chronic kidney disease: Secondary | ICD-10-CM | POA: Diagnosis not present

## 2019-11-16 MED ORDER — PANTOPRAZOLE SODIUM 40 MG PO TBEC: 40.0000 mg | DELAYED_RELEASE_TABLET | Freq: Every day | ORAL | 3 refills | Status: DC

## 2019-11-16 NOTE — Progress Notes (Signed)
Brian Rose. - 79 y.o. male MRN [id number]  Date of birth: [birth year]  Subjective Chief Complaint  Patient presents with  . Heartburn    HPI Brian Rose. is a 79 y.o. male here today with complaint of reflux symptoms.  He has history of ESRD on PD, HTN,  And T2DM.  Reports increased reflux symptoms for the past 2 weeks.  Denies changes to medications or diet.  He has had relief from tums and pepcid but these do not last very long.  He denies abdominal pain, nausea, chest pain/tightness, shortness of breath, or blood in his stool.   ROS:  A comprehensive ROS was completed and negative except as noted per HPI  No Known Allergies  Past Medical History:  Diagnosis Date  . Chronic kidney disease   . Diabetes ([place])   . Hypertension     History reviewed. No pertinent surgical history.  Social History   Socioeconomic History  . Marital status: Married    Spouse name: Not on file  . Number of children: Not on file  . Years of education: Not on file  . Highest education level: Not on file  Occupational History  . Not on file  Tobacco Use  . Smoking status: Former Smoker    Packs/day: 1.00    Years: 20.00    Pack years: 20.00    Types: Cigars    Quit date: 09/06/1981    Years since quitting: 38.2  . Smokeless tobacco: Never Used  Substance and Sexual Activity  . Alcohol use: No  . Drug use: No  . Sexual activity: Yes    Partners: Female  Other Topics Concern  . Not on file  Social History Narrative  . Not on file   Social Determinants of Health   Financial Resource Strain:   . Difficulty of Paying Living Expenses: Not on file  Food Insecurity:   . Worried About Charity fundraiser in the Last Year: Not on file  . Ran Out of Food in the Last Year: Not on file  Transportation Needs:   . Lack of Transportation (Medical): Not on file  . Lack of Transportation (Non-Medical): Not on file  Physical Activity:   . Days of Exercise per Week: Not on file  . Minutes of  Exercise per Session: Not on file  Stress:   . Feeling of Stress : Not on file  Social Connections:   . Frequency of Communication with Friends and Family: Not on file  . Frequency of Social Gatherings with Friends and Family: Not on file  . Attends Religious Services: Not on file  . Active Member of Clubs or Organizations: Not on file  . Attends Archivist Meetings: Not on file  . Marital Status: Not on file    Family History  Problem Relation Age of Onset  . Diabetes Mother   . Hypertension Mother   . Hypertension Father   . Heart attack Father 32       This is what he died from.  . Diabetes Sister   . Diabetes Brother   . Diabetes Brother   . Diabetes Sister     Health Maintenance  Topic Date Due  . Hepatitis C Screening  Never done  . INFLUENZA VACCINE  09/25/2019  . HEMOGLOBIN A1C  01/13/2020  . OPHTHALMOLOGY EXAM  05/25/2020  . FOOT EXAM  07/11/2020  . TETANUS/TDAP  01/01/2025  . COVID-19 Vaccine  Completed  . PNA vac Low Risk  Adult  Completed     ----------------------------------------------------------------------------------------------------------------------------------------------------------------------------------------------------------------- Physical Exam BP (!) 146/69 (BP Location: Left Arm, Patient Position: Sitting, Cuff Size: Normal)   Pulse 91   Temp 98.1 F (36.7 C)   Wt 174 lb 6.4 oz (79.1 kg)   SpO2 100%   BMI 25.75 kg/m   Physical Exam Constitutional:      Appearance: Normal appearance.  Cardiovascular:     Rate and Rhythm: Normal rate and regular rhythm.  Pulmonary:     Effort: Pulmonary effort is normal.     Breath sounds: Normal breath sounds.  Abdominal:     General: There is no distension.     Palpations: Abdomen is soft.     Tenderness: There is no abdominal tenderness.  Musculoskeletal:     Cervical back: Neck supple.  Neurological:     General: No focal deficit present.     Mental Status: He is alert.   Psychiatric:        Mood and Affect: Mood normal.        Behavior: Behavior normal.     ------------------------------------------------------------------------------------------------------------------------------------------------------------------------------------------------------------------- Assessment and Plan  GERD (gastroesophageal reflux disease) Start protonix 40mg  daily for the next couple of weeks. He may continue as needed after this.   He is instructed to follow up if not improving with this.    Meds ordered this encounter  Medications  . pantoprazole (PROTONIX) 40 MG tablet    Sig: Take 1 tablet (40 mg total) by mouth daily.    Dispense:  30 tablet    Refill:  3    No follow-ups on file.    This visit occurred during the SARS-CoV-2 public health emergency.  Safety protocols were in place, including screening questions prior to the visit, additional usage of staff PPE, and extensive cleaning of exam room while observing appropriate contact time as indicated for disinfecting solutions.

## 2019-11-16 NOTE — Assessment & Plan Note (Signed)
Start protonix 40mg  daily for the next couple of weeks. He may continue as needed after this.   He is instructed to follow up if not improving with this.

## 2019-11-16 NOTE — Patient Instructions (Signed)
Let's try protonix (pantoprazole) daily to help with reflux symptoms.  Let us know if this is not improving your symptoms.

## 2019-11-17 DIAGNOSIS — N186 End stage renal disease: Secondary | ICD-10-CM | POA: Diagnosis not present

## 2019-11-17 DIAGNOSIS — D631 Anemia in chronic kidney disease: Secondary | ICD-10-CM | POA: Diagnosis not present

## 2019-11-17 DIAGNOSIS — D509 Iron deficiency anemia, unspecified: Secondary | ICD-10-CM | POA: Diagnosis not present

## 2019-11-17 DIAGNOSIS — N2581 Secondary hyperparathyroidism of renal origin: Secondary | ICD-10-CM | POA: Diagnosis not present

## 2019-11-18 DIAGNOSIS — D631 Anemia in chronic kidney disease: Secondary | ICD-10-CM | POA: Diagnosis not present

## 2019-11-18 DIAGNOSIS — D509 Iron deficiency anemia, unspecified: Secondary | ICD-10-CM | POA: Diagnosis not present

## 2019-11-18 DIAGNOSIS — N186 End stage renal disease: Secondary | ICD-10-CM | POA: Diagnosis not present

## 2019-11-18 DIAGNOSIS — N2581 Secondary hyperparathyroidism of renal origin: Secondary | ICD-10-CM | POA: Diagnosis not present

## 2019-11-19 DIAGNOSIS — D509 Iron deficiency anemia, unspecified: Secondary | ICD-10-CM | POA: Diagnosis not present

## 2019-11-19 DIAGNOSIS — N2581 Secondary hyperparathyroidism of renal origin: Secondary | ICD-10-CM | POA: Diagnosis not present

## 2019-11-19 DIAGNOSIS — D631 Anemia in chronic kidney disease: Secondary | ICD-10-CM | POA: Diagnosis not present

## 2019-11-19 DIAGNOSIS — N186 End stage renal disease: Secondary | ICD-10-CM | POA: Diagnosis not present

## 2019-11-20 DIAGNOSIS — N2581 Secondary hyperparathyroidism of renal origin: Secondary | ICD-10-CM | POA: Diagnosis not present

## 2019-11-20 DIAGNOSIS — D631 Anemia in chronic kidney disease: Secondary | ICD-10-CM | POA: Diagnosis not present

## 2019-11-20 DIAGNOSIS — N186 End stage renal disease: Secondary | ICD-10-CM | POA: Diagnosis not present

## 2019-11-20 DIAGNOSIS — D509 Iron deficiency anemia, unspecified: Secondary | ICD-10-CM | POA: Diagnosis not present

## 2019-11-21 DIAGNOSIS — N186 End stage renal disease: Secondary | ICD-10-CM | POA: Diagnosis not present

## 2019-11-21 DIAGNOSIS — D631 Anemia in chronic kidney disease: Secondary | ICD-10-CM | POA: Diagnosis not present

## 2019-11-21 DIAGNOSIS — N2581 Secondary hyperparathyroidism of renal origin: Secondary | ICD-10-CM | POA: Diagnosis not present

## 2019-11-21 DIAGNOSIS — D509 Iron deficiency anemia, unspecified: Secondary | ICD-10-CM | POA: Diagnosis not present

## 2019-11-22 DIAGNOSIS — D631 Anemia in chronic kidney disease: Secondary | ICD-10-CM | POA: Diagnosis not present

## 2019-11-22 DIAGNOSIS — N186 End stage renal disease: Secondary | ICD-10-CM | POA: Diagnosis not present

## 2019-11-22 DIAGNOSIS — N2581 Secondary hyperparathyroidism of renal origin: Secondary | ICD-10-CM | POA: Diagnosis not present

## 2019-11-22 DIAGNOSIS — D509 Iron deficiency anemia, unspecified: Secondary | ICD-10-CM | POA: Diagnosis not present

## 2019-11-23 DIAGNOSIS — N2581 Secondary hyperparathyroidism of renal origin: Secondary | ICD-10-CM | POA: Diagnosis not present

## 2019-11-23 DIAGNOSIS — D509 Iron deficiency anemia, unspecified: Secondary | ICD-10-CM | POA: Diagnosis not present

## 2019-11-23 DIAGNOSIS — N186 End stage renal disease: Secondary | ICD-10-CM | POA: Diagnosis not present

## 2019-11-23 DIAGNOSIS — D631 Anemia in chronic kidney disease: Secondary | ICD-10-CM | POA: Diagnosis not present

## 2019-11-24 DIAGNOSIS — N186 End stage renal disease: Secondary | ICD-10-CM | POA: Diagnosis not present

## 2019-11-24 DIAGNOSIS — Z992 Dependence on renal dialysis: Secondary | ICD-10-CM | POA: Diagnosis not present

## 2019-11-24 DIAGNOSIS — D509 Iron deficiency anemia, unspecified: Secondary | ICD-10-CM | POA: Diagnosis not present

## 2019-11-24 DIAGNOSIS — N2581 Secondary hyperparathyroidism of renal origin: Secondary | ICD-10-CM | POA: Diagnosis not present

## 2019-11-24 DIAGNOSIS — D631 Anemia in chronic kidney disease: Secondary | ICD-10-CM | POA: Diagnosis not present

## 2019-11-25 DIAGNOSIS — N2581 Secondary hyperparathyroidism of renal origin: Secondary | ICD-10-CM | POA: Diagnosis not present

## 2019-11-25 DIAGNOSIS — Z23 Encounter for immunization: Secondary | ICD-10-CM | POA: Diagnosis not present

## 2019-11-25 DIAGNOSIS — N186 End stage renal disease: Secondary | ICD-10-CM | POA: Diagnosis not present

## 2019-11-25 DIAGNOSIS — D631 Anemia in chronic kidney disease: Secondary | ICD-10-CM | POA: Diagnosis not present

## 2019-11-26 DIAGNOSIS — Z23 Encounter for immunization: Secondary | ICD-10-CM | POA: Diagnosis not present

## 2019-11-26 DIAGNOSIS — D631 Anemia in chronic kidney disease: Secondary | ICD-10-CM | POA: Diagnosis not present

## 2019-11-26 DIAGNOSIS — N2581 Secondary hyperparathyroidism of renal origin: Secondary | ICD-10-CM | POA: Diagnosis not present

## 2019-11-26 DIAGNOSIS — N186 End stage renal disease: Secondary | ICD-10-CM | POA: Diagnosis not present

## 2019-11-27 DIAGNOSIS — N2581 Secondary hyperparathyroidism of renal origin: Secondary | ICD-10-CM | POA: Diagnosis not present

## 2019-11-27 DIAGNOSIS — N186 End stage renal disease: Secondary | ICD-10-CM | POA: Diagnosis not present

## 2019-11-27 DIAGNOSIS — Z23 Encounter for immunization: Secondary | ICD-10-CM | POA: Diagnosis not present

## 2019-11-27 DIAGNOSIS — D631 Anemia in chronic kidney disease: Secondary | ICD-10-CM | POA: Diagnosis not present

## 2019-11-28 DIAGNOSIS — Z23 Encounter for immunization: Secondary | ICD-10-CM | POA: Diagnosis not present

## 2019-11-28 DIAGNOSIS — D631 Anemia in chronic kidney disease: Secondary | ICD-10-CM | POA: Diagnosis not present

## 2019-11-28 DIAGNOSIS — N186 End stage renal disease: Secondary | ICD-10-CM | POA: Diagnosis not present

## 2019-11-28 DIAGNOSIS — N2581 Secondary hyperparathyroidism of renal origin: Secondary | ICD-10-CM | POA: Diagnosis not present

## 2019-11-29 DIAGNOSIS — D631 Anemia in chronic kidney disease: Secondary | ICD-10-CM | POA: Diagnosis not present

## 2019-11-29 DIAGNOSIS — N2581 Secondary hyperparathyroidism of renal origin: Secondary | ICD-10-CM | POA: Diagnosis not present

## 2019-11-29 DIAGNOSIS — Z23 Encounter for immunization: Secondary | ICD-10-CM | POA: Diagnosis not present

## 2019-11-29 DIAGNOSIS — N186 End stage renal disease: Secondary | ICD-10-CM | POA: Diagnosis not present

## 2019-11-30 DIAGNOSIS — N186 End stage renal disease: Secondary | ICD-10-CM | POA: Diagnosis not present

## 2019-11-30 DIAGNOSIS — D631 Anemia in chronic kidney disease: Secondary | ICD-10-CM | POA: Diagnosis not present

## 2019-11-30 DIAGNOSIS — N2581 Secondary hyperparathyroidism of renal origin: Secondary | ICD-10-CM | POA: Diagnosis not present

## 2019-11-30 DIAGNOSIS — Z23 Encounter for immunization: Secondary | ICD-10-CM | POA: Diagnosis not present

## 2019-12-01 DIAGNOSIS — N186 End stage renal disease: Secondary | ICD-10-CM | POA: Diagnosis not present

## 2019-12-01 DIAGNOSIS — D631 Anemia in chronic kidney disease: Secondary | ICD-10-CM | POA: Diagnosis not present

## 2019-12-01 DIAGNOSIS — Z23 Encounter for immunization: Secondary | ICD-10-CM | POA: Diagnosis not present

## 2019-12-01 DIAGNOSIS — N2581 Secondary hyperparathyroidism of renal origin: Secondary | ICD-10-CM | POA: Diagnosis not present

## 2019-12-02 DIAGNOSIS — D631 Anemia in chronic kidney disease: Secondary | ICD-10-CM | POA: Diagnosis not present

## 2019-12-02 DIAGNOSIS — N2581 Secondary hyperparathyroidism of renal origin: Secondary | ICD-10-CM | POA: Diagnosis not present

## 2019-12-02 DIAGNOSIS — Z23 Encounter for immunization: Secondary | ICD-10-CM | POA: Diagnosis not present

## 2019-12-02 DIAGNOSIS — N186 End stage renal disease: Secondary | ICD-10-CM | POA: Diagnosis not present

## 2019-12-03 DIAGNOSIS — N2581 Secondary hyperparathyroidism of renal origin: Secondary | ICD-10-CM | POA: Diagnosis not present

## 2019-12-03 DIAGNOSIS — Z23 Encounter for immunization: Secondary | ICD-10-CM | POA: Diagnosis not present

## 2019-12-03 DIAGNOSIS — N186 End stage renal disease: Secondary | ICD-10-CM | POA: Diagnosis not present

## 2019-12-03 DIAGNOSIS — D631 Anemia in chronic kidney disease: Secondary | ICD-10-CM | POA: Diagnosis not present

## 2019-12-04 DIAGNOSIS — D631 Anemia in chronic kidney disease: Secondary | ICD-10-CM | POA: Diagnosis not present

## 2019-12-04 DIAGNOSIS — Z23 Encounter for immunization: Secondary | ICD-10-CM | POA: Diagnosis not present

## 2019-12-04 DIAGNOSIS — N186 End stage renal disease: Secondary | ICD-10-CM | POA: Diagnosis not present

## 2019-12-04 DIAGNOSIS — N2581 Secondary hyperparathyroidism of renal origin: Secondary | ICD-10-CM | POA: Diagnosis not present

## 2019-12-05 DIAGNOSIS — N2581 Secondary hyperparathyroidism of renal origin: Secondary | ICD-10-CM | POA: Diagnosis not present

## 2019-12-05 DIAGNOSIS — Z23 Encounter for immunization: Secondary | ICD-10-CM | POA: Diagnosis not present

## 2019-12-05 DIAGNOSIS — D631 Anemia in chronic kidney disease: Secondary | ICD-10-CM | POA: Diagnosis not present

## 2019-12-05 DIAGNOSIS — N186 End stage renal disease: Secondary | ICD-10-CM | POA: Diagnosis not present

## 2019-12-06 DIAGNOSIS — N2581 Secondary hyperparathyroidism of renal origin: Secondary | ICD-10-CM | POA: Diagnosis not present

## 2019-12-06 DIAGNOSIS — N186 End stage renal disease: Secondary | ICD-10-CM | POA: Diagnosis not present

## 2019-12-06 DIAGNOSIS — D631 Anemia in chronic kidney disease: Secondary | ICD-10-CM | POA: Diagnosis not present

## 2019-12-06 DIAGNOSIS — Z23 Encounter for immunization: Secondary | ICD-10-CM | POA: Diagnosis not present

## 2019-12-07 DIAGNOSIS — Z23 Encounter for immunization: Secondary | ICD-10-CM | POA: Diagnosis not present

## 2019-12-07 DIAGNOSIS — N2581 Secondary hyperparathyroidism of renal origin: Secondary | ICD-10-CM | POA: Diagnosis not present

## 2019-12-07 DIAGNOSIS — N186 End stage renal disease: Secondary | ICD-10-CM | POA: Diagnosis not present

## 2019-12-07 DIAGNOSIS — D631 Anemia in chronic kidney disease: Secondary | ICD-10-CM | POA: Diagnosis not present

## 2019-12-08 DIAGNOSIS — N186 End stage renal disease: Secondary | ICD-10-CM | POA: Diagnosis not present

## 2019-12-08 DIAGNOSIS — N2581 Secondary hyperparathyroidism of renal origin: Secondary | ICD-10-CM | POA: Diagnosis not present

## 2019-12-08 DIAGNOSIS — Z23 Encounter for immunization: Secondary | ICD-10-CM | POA: Diagnosis not present

## 2019-12-08 DIAGNOSIS — D631 Anemia in chronic kidney disease: Secondary | ICD-10-CM | POA: Diagnosis not present

## 2019-12-08 DIAGNOSIS — E118 Type 2 diabetes mellitus with unspecified complications: Secondary | ICD-10-CM | POA: Diagnosis not present

## 2019-12-09 DIAGNOSIS — Z23 Encounter for immunization: Secondary | ICD-10-CM | POA: Diagnosis not present

## 2019-12-09 DIAGNOSIS — D631 Anemia in chronic kidney disease: Secondary | ICD-10-CM | POA: Diagnosis not present

## 2019-12-09 DIAGNOSIS — N186 End stage renal disease: Secondary | ICD-10-CM | POA: Diagnosis not present

## 2019-12-09 DIAGNOSIS — N2581 Secondary hyperparathyroidism of renal origin: Secondary | ICD-10-CM | POA: Diagnosis not present

## 2019-12-10 DIAGNOSIS — Z23 Encounter for immunization: Secondary | ICD-10-CM | POA: Diagnosis not present

## 2019-12-10 DIAGNOSIS — N186 End stage renal disease: Secondary | ICD-10-CM | POA: Diagnosis not present

## 2019-12-10 DIAGNOSIS — D631 Anemia in chronic kidney disease: Secondary | ICD-10-CM | POA: Diagnosis not present

## 2019-12-10 DIAGNOSIS — N2581 Secondary hyperparathyroidism of renal origin: Secondary | ICD-10-CM | POA: Diagnosis not present

## 2019-12-11 DIAGNOSIS — Z23 Encounter for immunization: Secondary | ICD-10-CM | POA: Diagnosis not present

## 2019-12-11 DIAGNOSIS — D631 Anemia in chronic kidney disease: Secondary | ICD-10-CM | POA: Diagnosis not present

## 2019-12-11 DIAGNOSIS — N186 End stage renal disease: Secondary | ICD-10-CM | POA: Diagnosis not present

## 2019-12-11 DIAGNOSIS — N2581 Secondary hyperparathyroidism of renal origin: Secondary | ICD-10-CM | POA: Diagnosis not present

## 2019-12-12 DIAGNOSIS — D631 Anemia in chronic kidney disease: Secondary | ICD-10-CM | POA: Diagnosis not present

## 2019-12-12 DIAGNOSIS — Z23 Encounter for immunization: Secondary | ICD-10-CM | POA: Diagnosis not present

## 2019-12-12 DIAGNOSIS — N2581 Secondary hyperparathyroidism of renal origin: Secondary | ICD-10-CM | POA: Diagnosis not present

## 2019-12-12 DIAGNOSIS — N186 End stage renal disease: Secondary | ICD-10-CM | POA: Diagnosis not present

## 2019-12-13 DIAGNOSIS — D631 Anemia in chronic kidney disease: Secondary | ICD-10-CM | POA: Diagnosis not present

## 2019-12-13 DIAGNOSIS — N2581 Secondary hyperparathyroidism of renal origin: Secondary | ICD-10-CM | POA: Diagnosis not present

## 2019-12-13 DIAGNOSIS — Z23 Encounter for immunization: Secondary | ICD-10-CM | POA: Diagnosis not present

## 2019-12-13 DIAGNOSIS — N186 End stage renal disease: Secondary | ICD-10-CM | POA: Diagnosis not present

## 2019-12-14 DIAGNOSIS — N2581 Secondary hyperparathyroidism of renal origin: Secondary | ICD-10-CM | POA: Diagnosis not present

## 2019-12-14 DIAGNOSIS — N186 End stage renal disease: Secondary | ICD-10-CM | POA: Diagnosis not present

## 2019-12-14 DIAGNOSIS — Z23 Encounter for immunization: Secondary | ICD-10-CM | POA: Diagnosis not present

## 2019-12-14 DIAGNOSIS — D631 Anemia in chronic kidney disease: Secondary | ICD-10-CM | POA: Diagnosis not present

## 2019-12-15 DIAGNOSIS — D631 Anemia in chronic kidney disease: Secondary | ICD-10-CM | POA: Diagnosis not present

## 2019-12-15 DIAGNOSIS — N186 End stage renal disease: Secondary | ICD-10-CM | POA: Diagnosis not present

## 2019-12-15 DIAGNOSIS — Z23 Encounter for immunization: Secondary | ICD-10-CM | POA: Diagnosis not present

## 2019-12-15 DIAGNOSIS — N2581 Secondary hyperparathyroidism of renal origin: Secondary | ICD-10-CM | POA: Diagnosis not present

## 2019-12-15 DIAGNOSIS — Z20822 Contact with and (suspected) exposure to covid-19: Secondary | ICD-10-CM | POA: Diagnosis not present

## 2019-12-16 DIAGNOSIS — D631 Anemia in chronic kidney disease: Secondary | ICD-10-CM | POA: Diagnosis not present

## 2019-12-16 DIAGNOSIS — Z23 Encounter for immunization: Secondary | ICD-10-CM | POA: Diagnosis not present

## 2019-12-16 DIAGNOSIS — N186 End stage renal disease: Secondary | ICD-10-CM | POA: Diagnosis not present

## 2019-12-16 DIAGNOSIS — N2581 Secondary hyperparathyroidism of renal origin: Secondary | ICD-10-CM | POA: Diagnosis not present

## 2019-12-17 DIAGNOSIS — N186 End stage renal disease: Secondary | ICD-10-CM | POA: Diagnosis not present

## 2019-12-17 DIAGNOSIS — Z23 Encounter for immunization: Secondary | ICD-10-CM | POA: Diagnosis not present

## 2019-12-17 DIAGNOSIS — N2581 Secondary hyperparathyroidism of renal origin: Secondary | ICD-10-CM | POA: Diagnosis not present

## 2019-12-17 DIAGNOSIS — D631 Anemia in chronic kidney disease: Secondary | ICD-10-CM | POA: Diagnosis not present

## 2019-12-18 DIAGNOSIS — N186 End stage renal disease: Secondary | ICD-10-CM | POA: Diagnosis not present

## 2019-12-18 DIAGNOSIS — Z23 Encounter for immunization: Secondary | ICD-10-CM | POA: Diagnosis not present

## 2019-12-18 DIAGNOSIS — D631 Anemia in chronic kidney disease: Secondary | ICD-10-CM | POA: Diagnosis not present

## 2019-12-18 DIAGNOSIS — N2581 Secondary hyperparathyroidism of renal origin: Secondary | ICD-10-CM | POA: Diagnosis not present

## 2019-12-19 DIAGNOSIS — N2581 Secondary hyperparathyroidism of renal origin: Secondary | ICD-10-CM | POA: Diagnosis not present

## 2019-12-19 DIAGNOSIS — N186 End stage renal disease: Secondary | ICD-10-CM | POA: Diagnosis not present

## 2019-12-19 DIAGNOSIS — D631 Anemia in chronic kidney disease: Secondary | ICD-10-CM | POA: Diagnosis not present

## 2019-12-19 DIAGNOSIS — Z23 Encounter for immunization: Secondary | ICD-10-CM | POA: Diagnosis not present

## 2019-12-20 DIAGNOSIS — N186 End stage renal disease: Secondary | ICD-10-CM | POA: Diagnosis not present

## 2019-12-20 DIAGNOSIS — D631 Anemia in chronic kidney disease: Secondary | ICD-10-CM | POA: Diagnosis not present

## 2019-12-20 DIAGNOSIS — Z23 Encounter for immunization: Secondary | ICD-10-CM | POA: Diagnosis not present

## 2019-12-20 DIAGNOSIS — N2581 Secondary hyperparathyroidism of renal origin: Secondary | ICD-10-CM | POA: Diagnosis not present

## 2019-12-21 DIAGNOSIS — Z23 Encounter for immunization: Secondary | ICD-10-CM | POA: Diagnosis not present

## 2019-12-21 DIAGNOSIS — N2581 Secondary hyperparathyroidism of renal origin: Secondary | ICD-10-CM | POA: Diagnosis not present

## 2019-12-21 DIAGNOSIS — N186 End stage renal disease: Secondary | ICD-10-CM | POA: Diagnosis not present

## 2019-12-21 DIAGNOSIS — D631 Anemia in chronic kidney disease: Secondary | ICD-10-CM | POA: Diagnosis not present

## 2019-12-22 DIAGNOSIS — N186 End stage renal disease: Secondary | ICD-10-CM | POA: Diagnosis not present

## 2019-12-22 DIAGNOSIS — R438 Other disturbances of smell and taste: Secondary | ICD-10-CM | POA: Diagnosis not present

## 2019-12-22 DIAGNOSIS — Z23 Encounter for immunization: Secondary | ICD-10-CM | POA: Diagnosis not present

## 2019-12-22 DIAGNOSIS — Z20822 Contact with and (suspected) exposure to covid-19: Secondary | ICD-10-CM | POA: Diagnosis not present

## 2019-12-22 DIAGNOSIS — D631 Anemia in chronic kidney disease: Secondary | ICD-10-CM | POA: Diagnosis not present

## 2019-12-22 DIAGNOSIS — N2581 Secondary hyperparathyroidism of renal origin: Secondary | ICD-10-CM | POA: Diagnosis not present

## 2019-12-23 DIAGNOSIS — I129 Hypertensive chronic kidney disease with stage 1 through stage 4 chronic kidney disease, or unspecified chronic kidney disease: Secondary | ICD-10-CM | POA: Diagnosis not present

## 2019-12-23 DIAGNOSIS — D631 Anemia in chronic kidney disease: Secondary | ICD-10-CM | POA: Diagnosis not present

## 2019-12-23 DIAGNOSIS — N186 End stage renal disease: Secondary | ICD-10-CM | POA: Diagnosis not present

## 2019-12-23 DIAGNOSIS — Z992 Dependence on renal dialysis: Secondary | ICD-10-CM | POA: Diagnosis not present

## 2019-12-23 DIAGNOSIS — Z23 Encounter for immunization: Secondary | ICD-10-CM | POA: Diagnosis not present

## 2019-12-23 DIAGNOSIS — N2581 Secondary hyperparathyroidism of renal origin: Secondary | ICD-10-CM | POA: Diagnosis not present

## 2019-12-24 DIAGNOSIS — N186 End stage renal disease: Secondary | ICD-10-CM | POA: Diagnosis not present

## 2019-12-24 DIAGNOSIS — D631 Anemia in chronic kidney disease: Secondary | ICD-10-CM | POA: Diagnosis not present

## 2019-12-24 DIAGNOSIS — N2581 Secondary hyperparathyroidism of renal origin: Secondary | ICD-10-CM | POA: Diagnosis not present

## 2019-12-24 DIAGNOSIS — Z23 Encounter for immunization: Secondary | ICD-10-CM | POA: Diagnosis not present

## 2019-12-25 DIAGNOSIS — N2581 Secondary hyperparathyroidism of renal origin: Secondary | ICD-10-CM | POA: Diagnosis not present

## 2019-12-25 DIAGNOSIS — Z992 Dependence on renal dialysis: Secondary | ICD-10-CM | POA: Diagnosis not present

## 2019-12-25 DIAGNOSIS — Z23 Encounter for immunization: Secondary | ICD-10-CM | POA: Diagnosis not present

## 2019-12-25 DIAGNOSIS — D631 Anemia in chronic kidney disease: Secondary | ICD-10-CM | POA: Diagnosis not present

## 2019-12-25 DIAGNOSIS — N186 End stage renal disease: Secondary | ICD-10-CM | POA: Diagnosis not present

## 2019-12-26 DIAGNOSIS — N2581 Secondary hyperparathyroidism of renal origin: Secondary | ICD-10-CM | POA: Diagnosis not present

## 2019-12-26 DIAGNOSIS — N186 End stage renal disease: Secondary | ICD-10-CM | POA: Diagnosis not present

## 2019-12-26 DIAGNOSIS — D631 Anemia in chronic kidney disease: Secondary | ICD-10-CM | POA: Diagnosis not present

## 2019-12-27 DIAGNOSIS — N2581 Secondary hyperparathyroidism of renal origin: Secondary | ICD-10-CM | POA: Diagnosis not present

## 2019-12-27 DIAGNOSIS — D631 Anemia in chronic kidney disease: Secondary | ICD-10-CM | POA: Diagnosis not present

## 2019-12-27 DIAGNOSIS — N186 End stage renal disease: Secondary | ICD-10-CM | POA: Diagnosis not present

## 2019-12-28 DIAGNOSIS — N2581 Secondary hyperparathyroidism of renal origin: Secondary | ICD-10-CM | POA: Diagnosis not present

## 2019-12-28 DIAGNOSIS — N186 End stage renal disease: Secondary | ICD-10-CM | POA: Diagnosis not present

## 2019-12-28 DIAGNOSIS — D631 Anemia in chronic kidney disease: Secondary | ICD-10-CM | POA: Diagnosis not present

## 2019-12-29 DIAGNOSIS — N2581 Secondary hyperparathyroidism of renal origin: Secondary | ICD-10-CM | POA: Diagnosis not present

## 2019-12-29 DIAGNOSIS — D631 Anemia in chronic kidney disease: Secondary | ICD-10-CM | POA: Diagnosis not present

## 2019-12-29 DIAGNOSIS — R0789 Other chest pain: Secondary | ICD-10-CM | POA: Diagnosis not present

## 2019-12-29 DIAGNOSIS — R12 Heartburn: Secondary | ICD-10-CM | POA: Diagnosis not present

## 2019-12-29 DIAGNOSIS — N186 End stage renal disease: Secondary | ICD-10-CM | POA: Diagnosis not present

## 2019-12-30 DIAGNOSIS — D631 Anemia in chronic kidney disease: Secondary | ICD-10-CM | POA: Diagnosis not present

## 2019-12-30 DIAGNOSIS — N186 End stage renal disease: Secondary | ICD-10-CM | POA: Diagnosis not present

## 2019-12-30 DIAGNOSIS — N2581 Secondary hyperparathyroidism of renal origin: Secondary | ICD-10-CM | POA: Diagnosis not present

## 2019-12-31 DIAGNOSIS — N186 End stage renal disease: Secondary | ICD-10-CM | POA: Diagnosis not present

## 2019-12-31 DIAGNOSIS — N2581 Secondary hyperparathyroidism of renal origin: Secondary | ICD-10-CM | POA: Diagnosis not present

## 2019-12-31 DIAGNOSIS — D631 Anemia in chronic kidney disease: Secondary | ICD-10-CM | POA: Diagnosis not present

## 2020-01-01 DIAGNOSIS — D631 Anemia in chronic kidney disease: Secondary | ICD-10-CM | POA: Diagnosis not present

## 2020-01-01 DIAGNOSIS — N186 End stage renal disease: Secondary | ICD-10-CM | POA: Diagnosis not present

## 2020-01-01 DIAGNOSIS — N2581 Secondary hyperparathyroidism of renal origin: Secondary | ICD-10-CM | POA: Diagnosis not present

## 2020-01-02 DIAGNOSIS — N186 End stage renal disease: Secondary | ICD-10-CM | POA: Diagnosis not present

## 2020-01-02 DIAGNOSIS — N2581 Secondary hyperparathyroidism of renal origin: Secondary | ICD-10-CM | POA: Diagnosis not present

## 2020-01-02 DIAGNOSIS — D631 Anemia in chronic kidney disease: Secondary | ICD-10-CM | POA: Diagnosis not present

## 2020-01-03 DIAGNOSIS — N2581 Secondary hyperparathyroidism of renal origin: Secondary | ICD-10-CM | POA: Diagnosis not present

## 2020-01-03 DIAGNOSIS — D631 Anemia in chronic kidney disease: Secondary | ICD-10-CM | POA: Diagnosis not present

## 2020-01-03 DIAGNOSIS — N186 End stage renal disease: Secondary | ICD-10-CM | POA: Diagnosis not present

## 2020-01-04 DIAGNOSIS — N186 End stage renal disease: Secondary | ICD-10-CM | POA: Diagnosis not present

## 2020-01-04 DIAGNOSIS — D631 Anemia in chronic kidney disease: Secondary | ICD-10-CM | POA: Diagnosis not present

## 2020-01-04 DIAGNOSIS — N2581 Secondary hyperparathyroidism of renal origin: Secondary | ICD-10-CM | POA: Diagnosis not present

## 2020-01-05 ENCOUNTER — Telehealth: Payer: Self-pay | Admitting: Neurology

## 2020-01-05 DIAGNOSIS — D631 Anemia in chronic kidney disease: Secondary | ICD-10-CM | POA: Diagnosis not present

## 2020-01-05 DIAGNOSIS — N2581 Secondary hyperparathyroidism of renal origin: Secondary | ICD-10-CM | POA: Diagnosis not present

## 2020-01-05 DIAGNOSIS — E118 Type 2 diabetes mellitus with unspecified complications: Secondary | ICD-10-CM | POA: Diagnosis not present

## 2020-01-05 DIAGNOSIS — N186 End stage renal disease: Secondary | ICD-10-CM | POA: Diagnosis not present

## 2020-01-05 DIAGNOSIS — Z4932 Encounter for adequacy testing for peritoneal dialysis: Secondary | ICD-10-CM | POA: Diagnosis not present

## 2020-01-05 NOTE — Telephone Encounter (Signed)
Patient has appt next Wednesday, but having increased issues with reflux. He is taking tums OTC. He never started Protonix prescribed by Dr. Zigmund Daniel because he read it was bad for your kidneys and he is on dialysis. Please advise if okay to start or if he should take something else?

## 2020-01-06 DIAGNOSIS — D631 Anemia in chronic kidney disease: Secondary | ICD-10-CM | POA: Diagnosis not present

## 2020-01-06 DIAGNOSIS — N2581 Secondary hyperparathyroidism of renal origin: Secondary | ICD-10-CM | POA: Diagnosis not present

## 2020-01-06 DIAGNOSIS — N186 End stage renal disease: Secondary | ICD-10-CM | POA: Diagnosis not present

## 2020-01-06 NOTE — Telephone Encounter (Signed)
Patient made aware of recommendations.   

## 2020-01-06 NOTE — Telephone Encounter (Signed)
No dose adjustment needed for renal disease.  He should try it for 6 weeks before coming back to see me.

## 2020-01-07 DIAGNOSIS — N2581 Secondary hyperparathyroidism of renal origin: Secondary | ICD-10-CM | POA: Diagnosis not present

## 2020-01-07 DIAGNOSIS — D631 Anemia in chronic kidney disease: Secondary | ICD-10-CM | POA: Diagnosis not present

## 2020-01-07 DIAGNOSIS — N186 End stage renal disease: Secondary | ICD-10-CM | POA: Diagnosis not present

## 2020-01-08 DIAGNOSIS — D631 Anemia in chronic kidney disease: Secondary | ICD-10-CM | POA: Diagnosis not present

## 2020-01-08 DIAGNOSIS — N186 End stage renal disease: Secondary | ICD-10-CM | POA: Diagnosis not present

## 2020-01-08 DIAGNOSIS — N2581 Secondary hyperparathyroidism of renal origin: Secondary | ICD-10-CM | POA: Diagnosis not present

## 2020-01-09 DIAGNOSIS — D631 Anemia in chronic kidney disease: Secondary | ICD-10-CM | POA: Diagnosis not present

## 2020-01-09 DIAGNOSIS — N2581 Secondary hyperparathyroidism of renal origin: Secondary | ICD-10-CM | POA: Diagnosis not present

## 2020-01-09 DIAGNOSIS — N186 End stage renal disease: Secondary | ICD-10-CM | POA: Diagnosis not present

## 2020-01-10 DIAGNOSIS — N2581 Secondary hyperparathyroidism of renal origin: Secondary | ICD-10-CM | POA: Diagnosis not present

## 2020-01-10 DIAGNOSIS — N186 End stage renal disease: Secondary | ICD-10-CM | POA: Diagnosis not present

## 2020-01-10 DIAGNOSIS — D631 Anemia in chronic kidney disease: Secondary | ICD-10-CM | POA: Diagnosis not present

## 2020-01-11 ENCOUNTER — Other Ambulatory Visit: Payer: Self-pay

## 2020-01-11 ENCOUNTER — Encounter: Payer: Self-pay | Admitting: Sports Medicine

## 2020-01-11 ENCOUNTER — Ambulatory Visit (INDEPENDENT_AMBULATORY_CARE_PROVIDER_SITE_OTHER): Payer: Medicare Other | Admitting: Sports Medicine

## 2020-01-11 VITALS — BP 124/61 | HR 72 | Ht 69.0 in | Wt 169.0 lb

## 2020-01-11 DIAGNOSIS — D631 Anemia in chronic kidney disease: Secondary | ICD-10-CM | POA: Diagnosis not present

## 2020-01-11 DIAGNOSIS — Z992 Dependence on renal dialysis: Secondary | ICD-10-CM | POA: Diagnosis not present

## 2020-01-11 DIAGNOSIS — R5383 Other fatigue: Secondary | ICD-10-CM

## 2020-01-11 DIAGNOSIS — N2581 Secondary hyperparathyroidism of renal origin: Secondary | ICD-10-CM

## 2020-01-11 DIAGNOSIS — K219 Gastro-esophageal reflux disease without esophagitis: Secondary | ICD-10-CM

## 2020-01-11 DIAGNOSIS — R531 Weakness: Secondary | ICD-10-CM

## 2020-01-11 DIAGNOSIS — N189 Chronic kidney disease, unspecified: Secondary | ICD-10-CM

## 2020-01-11 DIAGNOSIS — E119 Type 2 diabetes mellitus without complications: Secondary | ICD-10-CM

## 2020-01-11 DIAGNOSIS — E875 Hyperkalemia: Secondary | ICD-10-CM | POA: Diagnosis not present

## 2020-01-11 DIAGNOSIS — N186 End stage renal disease: Secondary | ICD-10-CM

## 2020-01-11 LAB — POCT GLYCOSYLATED HEMOGLOBIN (HGB A1C): Hemoglobin A1C: 6.4 % — AB (ref 4.0–5.6)

## 2020-01-11 MED ORDER — PANTOPRAZOLE SODIUM 40 MG PO TBEC: 40.0000 mg | DELAYED_RELEASE_TABLET | Freq: Two times a day (BID) | ORAL | 3 refills | Status: DC

## 2020-01-11 NOTE — Assessment & Plan Note (Signed)
Great he has been feeling somewhat tired lately, and may recheck his labs, he did have a hemoglobin around 11. It has been as low as nines. Rechecking his CBC.

## 2020-01-11 NOTE — Assessment & Plan Note (Signed)
Increasing GERD symptoms, he is on Protonix 40 mg daily. Increasing to twice daily (no dosage adjustment needed for patients on peritoneal dialysis), he does eat within an hour of bedtime so I have advised him to avoid any consumption of solids or liquids within 2-1/2 hours of bedtime. Follow-up with me in a month and if insufficient improvement we will refer him to gastroenterology for further evaluation.

## 2020-01-11 NOTE — Assessment & Plan Note (Signed)
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  We will address all of the above.

## 2020-01-11 NOTE — Progress Notes (Addendum)
    Procedures performed today:    None.  Independent interpretation of notes and tests performed by another provider:   None.  Brief History, Exam, Impression, and Recommendations:    Anemia of renal disease Great he has been feeling somewhat tired lately, and may recheck his labs, he did have a hemoglobin around 11. It has been as low as nines. Rechecking his CBC.  Diabetes mellitus type 2 in nonobese Hemoglobin A1c today is 6.4%, no change in plan.  Fatigue As above Dreyson's fatigue is likely multifactorial related to his end-stage chronic renal disease on dialysis, anemia of renal disease, he is having significant GERD symptoms interfering with his sleep and this is also made him feel somewhat more tired.  We will address all of the above.  GERD (gastroesophageal reflux disease) Increasing GERD symptoms, he is on Protonix 40 mg daily. Increasing to twice daily (no dosage adjustment needed for patients on peritoneal dialysis), he does eat within an hour of bedtime so I have advised him to avoid any consumption of solids or liquids within 2-1/2 hours of bedtime. Follow-up with me in a month and if insufficient improvement we will refer him to gastroenterology for further evaluation.  Progressive focal motor weakness Motor weakness seems to have resolved, we did an MRI of the cervical spine that did show cervical spinal stenosis without obvious or acute myelomalacia of the cervical cord.  Hyperkalemia Minimally elevated and not really that bad in the setting of chronic renal insufficiency on peritoneal dialysis, historically takes furosemide 40 mg 3 times daily, switching to Demadex 100 milligrams daily, discontinue all furosemide for now.  Recheck BMP/potassium in 1 to 2 weeks, he needs to also follow this up with his nephrologist.    ___________________________________________ Gwen Her. Dianah Field, M.D., ABFM., CAQSM. Primary Care and [place] Instructor of [place] of [facility] of Medicine

## 2020-01-11 NOTE — Assessment & Plan Note (Signed)
Motor weakness seems to have resolved, we did an MRI of the cervical spine that did show cervical spinal stenosis without obvious or acute myelomalacia of the cervical cord.

## 2020-01-11 NOTE — Assessment & Plan Note (Signed)
Hemoglobin A1c today is 6.4%, no change in plan.

## 2020-01-12 DIAGNOSIS — D631 Anemia in chronic kidney disease: Secondary | ICD-10-CM | POA: Diagnosis not present

## 2020-01-12 DIAGNOSIS — N2581 Secondary hyperparathyroidism of renal origin: Secondary | ICD-10-CM | POA: Diagnosis not present

## 2020-01-12 DIAGNOSIS — N186 End stage renal disease: Secondary | ICD-10-CM | POA: Diagnosis not present

## 2020-01-12 LAB — COMPREHENSIVE METABOLIC PANEL
AG Ratio: 1.4 (calc) (ref 1.0–2.5)
ALT: 18 U/L (ref 9–46)
AST: 21 U/L (ref 10–35)
Albumin: 3.6 g/dL (ref 3.6–5.1)
Alkaline phosphatase (APISO): 58 U/L (ref 35–144)
BUN/Creatinine Ratio: 4 (calc) — ABNORMAL LOW (ref 6–22)
BUN: 61 mg/dL — ABNORMAL HIGH (ref 7–25)
CO2: 29 mmol/L (ref 20–32)
Calcium: 9.8 mg/dL (ref 8.6–10.3)
Chloride: 99 mmol/L (ref 98–110)
Creat: 13.64 mg/dL — ABNORMAL HIGH (ref 0.70–1.11)
Globulin: 2.6 g/dL (calc) (ref 1.9–3.7)
Glucose, Bld: 65 mg/dL (ref 65–99)
Potassium: 5.7 mmol/L — ABNORMAL HIGH (ref 3.5–5.3)
Sodium: 138 mmol/L (ref 135–146)
Total Bilirubin: 0.3 mg/dL (ref 0.2–1.2)
Total Protein: 6.2 g/dL (ref 6.1–8.1)

## 2020-01-12 LAB — CBC
HCT: 35.2 % — ABNORMAL LOW (ref 38.5–50.0)
Hemoglobin: 10.9 g/dL — ABNORMAL LOW (ref 13.2–17.1)
MCH: 26 pg — ABNORMAL LOW (ref 27.0–33.0)
MCHC: 31 g/dL — ABNORMAL LOW (ref 32.0–36.0)
MCV: 83.8 fL (ref 80.0–100.0)
Platelets: 223 10*3/uL (ref 140–400)
RBC: 4.2 10*6/uL (ref 4.20–5.80)
RDW: 19.6 % — ABNORMAL HIGH (ref 11.0–15.0)
WBC: 7.3 10*3/uL (ref 3.8–10.8)

## 2020-01-12 LAB — TSH: TSH: 1.66 mIU/L (ref 0.40–4.50)

## 2020-01-13 DIAGNOSIS — E875 Hyperkalemia: Secondary | ICD-10-CM | POA: Insufficient documentation

## 2020-01-13 DIAGNOSIS — N2581 Secondary hyperparathyroidism of renal origin: Secondary | ICD-10-CM | POA: Diagnosis not present

## 2020-01-13 DIAGNOSIS — N186 End stage renal disease: Secondary | ICD-10-CM | POA: Diagnosis not present

## 2020-01-13 DIAGNOSIS — D631 Anemia in chronic kidney disease: Secondary | ICD-10-CM | POA: Diagnosis not present

## 2020-01-13 MED ORDER — TORSEMIDE 100 MG PO TABS: 100.0000 mg | ORAL_TABLET | Freq: Every day | ORAL | 3 refills | Status: DC

## 2020-01-13 NOTE — Addendum Note (Signed)
Addended by: Silverio Decamp on: 01/13/2020 03:17 PM   Modules accepted: Orders

## 2020-01-13 NOTE — Assessment & Plan Note (Signed)
Minimally elevated and not really that bad in the setting of chronic renal insufficiency on peritoneal dialysis, historically takes furosemide 40 mg 3 times daily, switching to Demadex 100 milligrams daily, discontinue all furosemide for now.  Recheck BMP/potassium in 1 to 2 weeks, he needs to also follow this up with his nephrologist.

## 2020-01-14 DIAGNOSIS — N186 End stage renal disease: Secondary | ICD-10-CM | POA: Diagnosis not present

## 2020-01-14 DIAGNOSIS — D631 Anemia in chronic kidney disease: Secondary | ICD-10-CM | POA: Diagnosis not present

## 2020-01-14 DIAGNOSIS — N2581 Secondary hyperparathyroidism of renal origin: Secondary | ICD-10-CM | POA: Diagnosis not present

## 2020-01-15 DIAGNOSIS — D631 Anemia in chronic kidney disease: Secondary | ICD-10-CM | POA: Diagnosis not present

## 2020-01-15 DIAGNOSIS — N186 End stage renal disease: Secondary | ICD-10-CM | POA: Diagnosis not present

## 2020-01-15 DIAGNOSIS — N2581 Secondary hyperparathyroidism of renal origin: Secondary | ICD-10-CM | POA: Diagnosis not present

## 2020-01-16 DIAGNOSIS — D631 Anemia in chronic kidney disease: Secondary | ICD-10-CM | POA: Diagnosis not present

## 2020-01-16 DIAGNOSIS — N2581 Secondary hyperparathyroidism of renal origin: Secondary | ICD-10-CM | POA: Diagnosis not present

## 2020-01-16 DIAGNOSIS — N186 End stage renal disease: Secondary | ICD-10-CM | POA: Diagnosis not present

## 2020-01-17 DIAGNOSIS — N2581 Secondary hyperparathyroidism of renal origin: Secondary | ICD-10-CM | POA: Diagnosis not present

## 2020-01-17 DIAGNOSIS — D631 Anemia in chronic kidney disease: Secondary | ICD-10-CM | POA: Diagnosis not present

## 2020-01-17 DIAGNOSIS — N186 End stage renal disease: Secondary | ICD-10-CM | POA: Diagnosis not present

## 2020-01-18 DIAGNOSIS — D631 Anemia in chronic kidney disease: Secondary | ICD-10-CM | POA: Diagnosis not present

## 2020-01-18 DIAGNOSIS — N186 End stage renal disease: Secondary | ICD-10-CM | POA: Diagnosis not present

## 2020-01-18 DIAGNOSIS — N2581 Secondary hyperparathyroidism of renal origin: Secondary | ICD-10-CM | POA: Diagnosis not present

## 2020-01-19 DIAGNOSIS — N186 End stage renal disease: Secondary | ICD-10-CM | POA: Diagnosis not present

## 2020-01-19 DIAGNOSIS — N2581 Secondary hyperparathyroidism of renal origin: Secondary | ICD-10-CM | POA: Diagnosis not present

## 2020-01-19 DIAGNOSIS — D631 Anemia in chronic kidney disease: Secondary | ICD-10-CM | POA: Diagnosis not present

## 2020-01-20 DIAGNOSIS — N2581 Secondary hyperparathyroidism of renal origin: Secondary | ICD-10-CM | POA: Diagnosis not present

## 2020-01-20 DIAGNOSIS — N186 End stage renal disease: Secondary | ICD-10-CM | POA: Diagnosis not present

## 2020-01-20 DIAGNOSIS — D631 Anemia in chronic kidney disease: Secondary | ICD-10-CM | POA: Diagnosis not present

## 2020-01-21 DIAGNOSIS — N186 End stage renal disease: Secondary | ICD-10-CM | POA: Diagnosis not present

## 2020-01-21 DIAGNOSIS — N2581 Secondary hyperparathyroidism of renal origin: Secondary | ICD-10-CM | POA: Diagnosis not present

## 2020-01-21 DIAGNOSIS — D631 Anemia in chronic kidney disease: Secondary | ICD-10-CM | POA: Diagnosis not present

## 2020-01-22 DIAGNOSIS — N186 End stage renal disease: Secondary | ICD-10-CM | POA: Diagnosis not present

## 2020-01-22 DIAGNOSIS — D631 Anemia in chronic kidney disease: Secondary | ICD-10-CM | POA: Diagnosis not present

## 2020-01-22 DIAGNOSIS — N2581 Secondary hyperparathyroidism of renal origin: Secondary | ICD-10-CM | POA: Diagnosis not present

## 2020-01-23 DIAGNOSIS — N186 End stage renal disease: Secondary | ICD-10-CM | POA: Diagnosis not present

## 2020-01-23 DIAGNOSIS — N2581 Secondary hyperparathyroidism of renal origin: Secondary | ICD-10-CM | POA: Diagnosis not present

## 2020-01-23 DIAGNOSIS — D631 Anemia in chronic kidney disease: Secondary | ICD-10-CM | POA: Diagnosis not present

## 2020-01-24 DIAGNOSIS — M1000 Idiopathic gout, unspecified site: Secondary | ICD-10-CM | POA: Diagnosis not present

## 2020-01-24 DIAGNOSIS — G4700 Insomnia, unspecified: Secondary | ICD-10-CM | POA: Diagnosis not present

## 2020-01-24 DIAGNOSIS — N2581 Secondary hyperparathyroidism of renal origin: Secondary | ICD-10-CM | POA: Diagnosis not present

## 2020-01-24 DIAGNOSIS — R12 Heartburn: Secondary | ICD-10-CM | POA: Diagnosis not present

## 2020-01-24 DIAGNOSIS — I10 Essential (primary) hypertension: Secondary | ICD-10-CM | POA: Diagnosis not present

## 2020-01-24 DIAGNOSIS — N186 End stage renal disease: Secondary | ICD-10-CM | POA: Diagnosis not present

## 2020-01-24 DIAGNOSIS — Z992 Dependence on renal dialysis: Secondary | ICD-10-CM | POA: Diagnosis not present

## 2020-01-24 DIAGNOSIS — D631 Anemia in chronic kidney disease: Secondary | ICD-10-CM | POA: Diagnosis not present

## 2020-01-25 DIAGNOSIS — N186 End stage renal disease: Secondary | ICD-10-CM | POA: Diagnosis not present

## 2020-01-25 DIAGNOSIS — N2581 Secondary hyperparathyroidism of renal origin: Secondary | ICD-10-CM | POA: Diagnosis not present

## 2020-01-25 DIAGNOSIS — D631 Anemia in chronic kidney disease: Secondary | ICD-10-CM | POA: Diagnosis not present

## 2020-01-26 DIAGNOSIS — D631 Anemia in chronic kidney disease: Secondary | ICD-10-CM | POA: Diagnosis not present

## 2020-01-26 DIAGNOSIS — N2581 Secondary hyperparathyroidism of renal origin: Secondary | ICD-10-CM | POA: Diagnosis not present

## 2020-01-26 DIAGNOSIS — N186 End stage renal disease: Secondary | ICD-10-CM | POA: Diagnosis not present

## 2020-01-27 DIAGNOSIS — N2581 Secondary hyperparathyroidism of renal origin: Secondary | ICD-10-CM | POA: Diagnosis not present

## 2020-01-27 DIAGNOSIS — N186 End stage renal disease: Secondary | ICD-10-CM | POA: Diagnosis not present

## 2020-01-27 DIAGNOSIS — D631 Anemia in chronic kidney disease: Secondary | ICD-10-CM | POA: Diagnosis not present

## 2020-01-28 DIAGNOSIS — D631 Anemia in chronic kidney disease: Secondary | ICD-10-CM | POA: Diagnosis not present

## 2020-01-28 DIAGNOSIS — N2581 Secondary hyperparathyroidism of renal origin: Secondary | ICD-10-CM | POA: Diagnosis not present

## 2020-01-28 DIAGNOSIS — N186 End stage renal disease: Secondary | ICD-10-CM | POA: Diagnosis not present

## 2020-01-29 DIAGNOSIS — N2581 Secondary hyperparathyroidism of renal origin: Secondary | ICD-10-CM | POA: Diagnosis not present

## 2020-01-29 DIAGNOSIS — D631 Anemia in chronic kidney disease: Secondary | ICD-10-CM | POA: Diagnosis not present

## 2020-01-29 DIAGNOSIS — N186 End stage renal disease: Secondary | ICD-10-CM | POA: Diagnosis not present

## 2020-01-30 DIAGNOSIS — N2581 Secondary hyperparathyroidism of renal origin: Secondary | ICD-10-CM | POA: Diagnosis not present

## 2020-01-30 DIAGNOSIS — D631 Anemia in chronic kidney disease: Secondary | ICD-10-CM | POA: Diagnosis not present

## 2020-01-30 DIAGNOSIS — N186 End stage renal disease: Secondary | ICD-10-CM | POA: Diagnosis not present

## 2020-01-31 DIAGNOSIS — N2581 Secondary hyperparathyroidism of renal origin: Secondary | ICD-10-CM | POA: Diagnosis not present

## 2020-01-31 DIAGNOSIS — D631 Anemia in chronic kidney disease: Secondary | ICD-10-CM | POA: Diagnosis not present

## 2020-01-31 DIAGNOSIS — N186 End stage renal disease: Secondary | ICD-10-CM | POA: Diagnosis not present

## 2020-02-01 DIAGNOSIS — D631 Anemia in chronic kidney disease: Secondary | ICD-10-CM | POA: Diagnosis not present

## 2020-02-01 DIAGNOSIS — M25562 Pain in left knee: Secondary | ICD-10-CM | POA: Diagnosis not present

## 2020-02-01 DIAGNOSIS — G8929 Other chronic pain: Secondary | ICD-10-CM | POA: Diagnosis not present

## 2020-02-01 DIAGNOSIS — M170 Bilateral primary osteoarthritis of knee: Secondary | ICD-10-CM | POA: Diagnosis not present

## 2020-02-01 DIAGNOSIS — N2581 Secondary hyperparathyroidism of renal origin: Secondary | ICD-10-CM | POA: Diagnosis not present

## 2020-02-01 DIAGNOSIS — N186 End stage renal disease: Secondary | ICD-10-CM | POA: Diagnosis not present

## 2020-02-01 DIAGNOSIS — M25561 Pain in right knee: Secondary | ICD-10-CM | POA: Diagnosis not present

## 2020-02-02 DIAGNOSIS — D631 Anemia in chronic kidney disease: Secondary | ICD-10-CM | POA: Diagnosis not present

## 2020-02-02 DIAGNOSIS — N2581 Secondary hyperparathyroidism of renal origin: Secondary | ICD-10-CM | POA: Diagnosis not present

## 2020-02-02 DIAGNOSIS — N186 End stage renal disease: Secondary | ICD-10-CM | POA: Diagnosis not present

## 2020-02-03 DIAGNOSIS — N2581 Secondary hyperparathyroidism of renal origin: Secondary | ICD-10-CM | POA: Diagnosis not present

## 2020-02-03 DIAGNOSIS — D631 Anemia in chronic kidney disease: Secondary | ICD-10-CM | POA: Diagnosis not present

## 2020-02-03 DIAGNOSIS — N186 End stage renal disease: Secondary | ICD-10-CM | POA: Diagnosis not present

## 2020-02-04 DIAGNOSIS — D631 Anemia in chronic kidney disease: Secondary | ICD-10-CM | POA: Diagnosis not present

## 2020-02-04 DIAGNOSIS — N2581 Secondary hyperparathyroidism of renal origin: Secondary | ICD-10-CM | POA: Diagnosis not present

## 2020-02-04 DIAGNOSIS — N186 End stage renal disease: Secondary | ICD-10-CM | POA: Diagnosis not present

## 2020-02-05 DIAGNOSIS — N2581 Secondary hyperparathyroidism of renal origin: Secondary | ICD-10-CM | POA: Diagnosis not present

## 2020-02-05 DIAGNOSIS — N186 End stage renal disease: Secondary | ICD-10-CM | POA: Diagnosis not present

## 2020-02-05 DIAGNOSIS — D631 Anemia in chronic kidney disease: Secondary | ICD-10-CM | POA: Diagnosis not present

## 2020-02-06 DIAGNOSIS — D631 Anemia in chronic kidney disease: Secondary | ICD-10-CM | POA: Diagnosis not present

## 2020-02-06 DIAGNOSIS — E118 Type 2 diabetes mellitus with unspecified complications: Secondary | ICD-10-CM | POA: Diagnosis not present

## 2020-02-06 DIAGNOSIS — N2581 Secondary hyperparathyroidism of renal origin: Secondary | ICD-10-CM | POA: Diagnosis not present

## 2020-02-06 DIAGNOSIS — N186 End stage renal disease: Secondary | ICD-10-CM | POA: Diagnosis not present

## 2020-02-07 DIAGNOSIS — N2581 Secondary hyperparathyroidism of renal origin: Secondary | ICD-10-CM | POA: Diagnosis not present

## 2020-02-07 DIAGNOSIS — D631 Anemia in chronic kidney disease: Secondary | ICD-10-CM | POA: Diagnosis not present

## 2020-02-07 DIAGNOSIS — N186 End stage renal disease: Secondary | ICD-10-CM | POA: Diagnosis not present

## 2020-02-08 ENCOUNTER — Encounter: Payer: Self-pay | Admitting: Sports Medicine

## 2020-02-08 ENCOUNTER — Other Ambulatory Visit: Payer: Self-pay

## 2020-02-08 ENCOUNTER — Ambulatory Visit (INDEPENDENT_AMBULATORY_CARE_PROVIDER_SITE_OTHER): Payer: Medicare Other | Admitting: Sports Medicine

## 2020-02-08 DIAGNOSIS — M170 Bilateral primary osteoarthritis of knee: Secondary | ICD-10-CM | POA: Diagnosis not present

## 2020-02-08 DIAGNOSIS — Z4932 Encounter for adequacy testing for peritoneal dialysis: Secondary | ICD-10-CM | POA: Diagnosis not present

## 2020-02-08 DIAGNOSIS — N186 End stage renal disease: Secondary | ICD-10-CM | POA: Diagnosis not present

## 2020-02-08 DIAGNOSIS — N2581 Secondary hyperparathyroidism of renal origin: Secondary | ICD-10-CM | POA: Diagnosis not present

## 2020-02-08 DIAGNOSIS — I10 Essential (primary) hypertension: Secondary | ICD-10-CM | POA: Diagnosis not present

## 2020-02-08 DIAGNOSIS — E875 Hyperkalemia: Secondary | ICD-10-CM

## 2020-02-08 DIAGNOSIS — D631 Anemia in chronic kidney disease: Secondary | ICD-10-CM | POA: Diagnosis not present

## 2020-02-08 DIAGNOSIS — K219 Gastro-esophageal reflux disease without esophagitis: Secondary | ICD-10-CM

## 2020-02-08 MED ORDER — CARVEDILOL 6.25 MG PO TABS: 6.2500 mg | ORAL_TABLET | Freq: Two times a day (BID) | ORAL | 3 refills | Status: DC

## 2020-02-08 NOTE — Assessment & Plan Note (Signed)
Resolved with the switch from furosemide to torsemide, return as needed for this, no change in plan.

## 2020-02-08 NOTE — Patient Instructions (Signed)
Let me know blood pressure readings in 2 weeks and about acid reflux in 4 weeks.

## 2020-02-08 NOTE — Progress Notes (Signed)
    Procedures performed today:    None.  Independent interpretation of notes and tests performed by another provider:   None.  Brief History, Exam, Impression, and Recommendations:    Essential hypertension, benign Carvedilol was dropped to 12.5 mg, blood pressure is looking good today but he does have some lows, at 79 years old we do not need aggressive control of his blood pressure so I am going to drop him down to 6.25 mg, he will let us know what his blood pressures look like over the next several weeks.  GERD (gastroesophageal reflux disease) Improved Susy Frizzle with Protonix 40 mg twice daily. He avoids food within 2 and half hours of bedtime, he would like to give this another month before considering referral to GI, he is still having a few trace of residual symptoms, no symptoms or signs of GI bleed.  Hyperkalemia Resolved with the switch from furosemide to torsemide, return as needed for this, no change in plan.    ___________________________________________ Gwen Her. Dianah Field, M.D., ABFM., CAQSM. Primary Care and [place] Instructor of [place] of [facility] of Medicine

## 2020-02-08 NOTE — Assessment & Plan Note (Signed)
Carvedilol was dropped to 12.5 mg, blood pressure is looking good today but he does have some lows, at 79 years old we do not need aggressive control of his blood pressure so I am going to drop him down to 6.25 mg, he will let us know what his blood pressures look like over the next several weeks.

## 2020-02-08 NOTE — Assessment & Plan Note (Signed)
Improved Susy Frizzle with Protonix 40 mg twice daily. He avoids food within 2 and half hours of bedtime, he would like to give this another month before considering referral to GI, he is still having a few trace of residual symptoms, no symptoms or signs of GI bleed.

## 2020-02-09 DIAGNOSIS — N2581 Secondary hyperparathyroidism of renal origin: Secondary | ICD-10-CM | POA: Diagnosis not present

## 2020-02-09 DIAGNOSIS — D631 Anemia in chronic kidney disease: Secondary | ICD-10-CM | POA: Diagnosis not present

## 2020-02-09 DIAGNOSIS — N186 End stage renal disease: Secondary | ICD-10-CM | POA: Diagnosis not present

## 2020-02-10 DIAGNOSIS — N2581 Secondary hyperparathyroidism of renal origin: Secondary | ICD-10-CM | POA: Diagnosis not present

## 2020-02-10 DIAGNOSIS — N186 End stage renal disease: Secondary | ICD-10-CM | POA: Diagnosis not present

## 2020-02-10 DIAGNOSIS — D631 Anemia in chronic kidney disease: Secondary | ICD-10-CM | POA: Diagnosis not present

## 2020-02-11 DIAGNOSIS — N186 End stage renal disease: Secondary | ICD-10-CM | POA: Diagnosis not present

## 2020-02-11 DIAGNOSIS — D631 Anemia in chronic kidney disease: Secondary | ICD-10-CM | POA: Diagnosis not present

## 2020-02-11 DIAGNOSIS — N2581 Secondary hyperparathyroidism of renal origin: Secondary | ICD-10-CM | POA: Diagnosis not present

## 2020-02-12 DIAGNOSIS — N186 End stage renal disease: Secondary | ICD-10-CM | POA: Diagnosis not present

## 2020-02-12 DIAGNOSIS — D631 Anemia in chronic kidney disease: Secondary | ICD-10-CM | POA: Diagnosis not present

## 2020-02-12 DIAGNOSIS — N2581 Secondary hyperparathyroidism of renal origin: Secondary | ICD-10-CM | POA: Diagnosis not present

## 2020-02-13 DIAGNOSIS — N186 End stage renal disease: Secondary | ICD-10-CM | POA: Diagnosis not present

## 2020-02-13 DIAGNOSIS — N2581 Secondary hyperparathyroidism of renal origin: Secondary | ICD-10-CM | POA: Diagnosis not present

## 2020-02-13 DIAGNOSIS — D631 Anemia in chronic kidney disease: Secondary | ICD-10-CM | POA: Diagnosis not present

## 2020-02-14 DIAGNOSIS — N186 End stage renal disease: Secondary | ICD-10-CM | POA: Diagnosis not present

## 2020-02-14 DIAGNOSIS — N2581 Secondary hyperparathyroidism of renal origin: Secondary | ICD-10-CM | POA: Diagnosis not present

## 2020-02-14 DIAGNOSIS — D631 Anemia in chronic kidney disease: Secondary | ICD-10-CM | POA: Diagnosis not present

## 2020-02-15 DIAGNOSIS — N186 End stage renal disease: Secondary | ICD-10-CM | POA: Diagnosis not present

## 2020-02-15 DIAGNOSIS — M170 Bilateral primary osteoarthritis of knee: Secondary | ICD-10-CM | POA: Diagnosis not present

## 2020-02-15 DIAGNOSIS — D631 Anemia in chronic kidney disease: Secondary | ICD-10-CM | POA: Diagnosis not present

## 2020-02-15 DIAGNOSIS — N2581 Secondary hyperparathyroidism of renal origin: Secondary | ICD-10-CM | POA: Diagnosis not present

## 2020-02-16 DIAGNOSIS — D631 Anemia in chronic kidney disease: Secondary | ICD-10-CM | POA: Diagnosis not present

## 2020-02-16 DIAGNOSIS — N2581 Secondary hyperparathyroidism of renal origin: Secondary | ICD-10-CM | POA: Diagnosis not present

## 2020-02-16 DIAGNOSIS — N186 End stage renal disease: Secondary | ICD-10-CM | POA: Diagnosis not present

## 2020-02-17 DIAGNOSIS — N186 End stage renal disease: Secondary | ICD-10-CM | POA: Diagnosis not present

## 2020-02-17 DIAGNOSIS — N2581 Secondary hyperparathyroidism of renal origin: Secondary | ICD-10-CM | POA: Diagnosis not present

## 2020-02-17 DIAGNOSIS — D631 Anemia in chronic kidney disease: Secondary | ICD-10-CM | POA: Diagnosis not present

## 2020-02-18 DIAGNOSIS — D631 Anemia in chronic kidney disease: Secondary | ICD-10-CM | POA: Diagnosis not present

## 2020-02-18 DIAGNOSIS — N186 End stage renal disease: Secondary | ICD-10-CM | POA: Diagnosis not present

## 2020-02-18 DIAGNOSIS — N2581 Secondary hyperparathyroidism of renal origin: Secondary | ICD-10-CM | POA: Diagnosis not present

## 2020-02-19 DIAGNOSIS — N186 End stage renal disease: Secondary | ICD-10-CM | POA: Diagnosis not present

## 2020-02-19 DIAGNOSIS — N2581 Secondary hyperparathyroidism of renal origin: Secondary | ICD-10-CM | POA: Diagnosis not present

## 2020-02-19 DIAGNOSIS — D631 Anemia in chronic kidney disease: Secondary | ICD-10-CM | POA: Diagnosis not present

## 2020-02-20 DIAGNOSIS — N186 End stage renal disease: Secondary | ICD-10-CM | POA: Diagnosis not present

## 2020-02-20 DIAGNOSIS — N2581 Secondary hyperparathyroidism of renal origin: Secondary | ICD-10-CM | POA: Diagnosis not present

## 2020-02-20 DIAGNOSIS — D631 Anemia in chronic kidney disease: Secondary | ICD-10-CM | POA: Diagnosis not present

## 2020-02-21 DIAGNOSIS — N186 End stage renal disease: Secondary | ICD-10-CM | POA: Diagnosis not present

## 2020-02-21 DIAGNOSIS — D631 Anemia in chronic kidney disease: Secondary | ICD-10-CM | POA: Diagnosis not present

## 2020-02-21 DIAGNOSIS — N2581 Secondary hyperparathyroidism of renal origin: Secondary | ICD-10-CM | POA: Diagnosis not present

## 2020-02-22 DIAGNOSIS — N2581 Secondary hyperparathyroidism of renal origin: Secondary | ICD-10-CM | POA: Diagnosis not present

## 2020-02-22 DIAGNOSIS — N186 End stage renal disease: Secondary | ICD-10-CM | POA: Diagnosis not present

## 2020-02-22 DIAGNOSIS — D631 Anemia in chronic kidney disease: Secondary | ICD-10-CM | POA: Diagnosis not present

## 2020-02-23 DIAGNOSIS — N186 End stage renal disease: Secondary | ICD-10-CM | POA: Diagnosis not present

## 2020-02-23 DIAGNOSIS — N2581 Secondary hyperparathyroidism of renal origin: Secondary | ICD-10-CM | POA: Diagnosis not present

## 2020-02-23 DIAGNOSIS — D631 Anemia in chronic kidney disease: Secondary | ICD-10-CM | POA: Diagnosis not present

## 2020-02-24 DIAGNOSIS — D631 Anemia in chronic kidney disease: Secondary | ICD-10-CM | POA: Diagnosis not present

## 2020-02-24 DIAGNOSIS — N186 End stage renal disease: Secondary | ICD-10-CM | POA: Diagnosis not present

## 2020-02-24 DIAGNOSIS — Z992 Dependence on renal dialysis: Secondary | ICD-10-CM | POA: Diagnosis not present

## 2020-02-24 DIAGNOSIS — N2581 Secondary hyperparathyroidism of renal origin: Secondary | ICD-10-CM | POA: Diagnosis not present

## 2020-02-27 ENCOUNTER — Emergency Department (INDEPENDENT_AMBULATORY_CARE_PROVIDER_SITE_OTHER)
Admission: EM | Admit: 2020-02-27 | Discharge: 2020-02-27 | Disposition: A | Discharge status: Home / Self Care | Payer: Medicare Other | Source: Home / Self Care

## 2020-02-27 DIAGNOSIS — H18891 Other specified disorders of cornea, right eye: Secondary | ICD-10-CM

## 2020-02-27 NOTE — ED Provider Notes (Signed)
[facility]    CSN: [id number] Arrival date & time: 02/27/20  1807      History   Chief Complaint Chief Complaint  Patient presents with  . Eye Pain    Right    HPI Martrell Gangl Jr. is a 80 y.o. male.   HPI Rosalio Laible Jr. is a 80 y.o. male presenting to UC with c/o Right eye irritation that started earlier this afternoon while helping remove a tree limb that had fallen on his daughter's car. Pt concerned he may have gotten glass or debris in his eye. He has tried to flush his eye but still has a foreign body senstaion. Denies change in vision or discharge. He wears reading glasses but no contacts.    Past Medical History:  Diagnosis Date  . Chronic kidney disease   . Diabetes (HCC)   . Hypertension     Patient Active Problem List   Diagnosis Date Noted  . Hyperkalemia 01/13/2020  . GERD (gastroesophageal reflux disease) 11/16/2019  . Progressive focal motor weakness 08/24/2019  . Trigger thumb, right thumb 03/26/2018  . Bilateral foot pain 12/03/2017  . Elevated LDL cholesterol level 09/25/2017  . Fatigue 09/02/2017  . Secondary hyperparathyroidism of renal origin (HCC) 08/11/2016  . Systolic murmur 04/03/2016  . Lumbar degenerative disc disease 12/05/2015  . Benign essential tremor 09/12/2015  . 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 Anemia of renal disease 09/08/2013  . Pseudogout 09/06/2013  . Left knee pain 09/06/2013  . Diabetes mellitus type 2 in nonobese (HCC) 09/06/2013  . Essential hypertension, benign 09/06/2013  . Annual physical exam 09/06/2013  . 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 Hypertensive retinopathy 03/30/2012  . Gout 03/30/2012    History reviewed. No pertinent surgical history.     Home Medications    Prior to Admission medications   Medication Sig Start Date End Date Taking? Authorizing Provider  aspirin 81 MG tablet Take 81 mg by mouth 2  (two) times daily.    [provider]  bisacodyl (BISACODYL LAXATIVE) 10 MG suppository Place 1 suppository (10 mg total) rectally as needed for moderate constipation. 06/14/16   Beese, Stephen A, MD  Blood Glucose Monitoring Suppl (BAYER CONTOUR MONITOR) W/DEVICE KIT Use as needed. 11/29/14   Thekkekandam, Thomas J, MD  carvedilol (COREG) 6.25 MG tablet Take 1 tablet (6.25 mg total) by mouth 2 (two) times daily with a meal. 02/08/20   Thekkekandam, Thomas J, MD  colchicine 0.6 MG tablet 0.3 mg (1/2 tab) no more than twice weekly during flares 01/08/18   Thekkekandam, Thomas J, MD  Cyanocobalamin (B-12) 1000 MCG CAPS Take by mouth.    [provider]  lactulose (CHRONULAC) 10 GM/15ML solution Take 15 mLs (10 g total) by mouth 2 (two) times daily. 06/14/16   Beese, Stephen A, MD  MULTIPLE VITAMINS-MINERALS PO Take 1 tablet by mouth every morning.    [provider]  Niacin (VITAMIN B-3 PO) Take by mouth.    [provider]  Omega-3 Fatty Acids (FISH OIL) 1000 MG CAPS Take by mouth.    [provider]  pantoprazole (PROTONIX) 40 MG tablet Take 1 tablet (40 mg total) by mouth 2 (two) times daily before a meal. 01/11/20   Thekkekandam, Thomas J, MD  pioglitazone (ACTOS) 30 MG tablet Take 1 tablet (30 mg total) by mouth daily. 07/12/19   Thekkekandam, Thomas J, MD  pyridoxine (B-6) 100 MG tablet Take 100   mg by mouth daily.    [provider]  torsemide (DEMADEX) 100 MG tablet Take 1 tablet (100 mg total) by mouth daily. 01/13/20   Silverio Decamp, MD    Family History Family History  Problem Relation Age of Onset  . Diabetes Mother   . Hypertension Mother   . Hypertension Father   . Heart attack Father 50       This is what he died from.  . Diabetes Sister   . Diabetes Brother   . Diabetes Brother   . Diabetes Sister     Social History Social History   Tobacco Use  . Smoking status: Former Smoker    Packs/day: 1.00    Years:  20.00    Pack years: 20.00    Types: Cigars    Quit date: 09/06/1981    Years since quitting: 38.5  . Smokeless tobacco: Never Used  Substance Use Topics  . Alcohol use: No  . Drug use: No     Allergies   Patient has no known allergies.   Review of Systems Review of Systems  Constitutional: Negative for chills and fever.  HENT: Negative for congestion.   Eyes: Positive for pain (irritation) and itching. Negative for photophobia, discharge, redness and visual disturbance.  Neurological: Negative for dizziness and headaches.     Physical Exam Triage Vital Signs ED Triage Vitals  Enc Vitals Group     BP 02/27/20 1851 (!) 164/77     Pulse Rate 02/27/20 1851 67     Resp 02/27/20 1851 16     Temp 02/27/20 1851 97.9 F (36.6 C)     Temp Source 02/27/20 1851 Oral     SpO2 02/27/20 1851 100 %     Weight --      Height --      Head Circumference --      Peak Flow --      Pain Score 02/27/20 1850 1     Pain Loc --      Pain Edu? --      Excl. in [place]? --    No data found.  Updated Vital Signs BP (!) 164/77 (BP Location: Right Arm) Comment: did not take bp meds today  Pulse 67   Temp 97.9 F (36.6 C) (Oral)   Resp 16   SpO2 100%   Visual Acuity Right Eye Distance: 20/25 Left Eye Distance: 20/25 Bilateral Distance: 20/20  Right Eye Near:   Left Eye Near:    Bilateral Near:     Physical Exam Vitals and nursing note reviewed.  Constitutional:      Appearance: He is well-developed and well-nourished.  HENT:     Head: Normocephalic and atraumatic.  Eyes:     General: Lids are normal. Lids are everted, no foreign bodies appreciated. Vision grossly intact. Gaze aligned appropriately.        Right eye: No foreign body, discharge or hordeolum.     Extraocular Movements: Extraocular movements intact and EOM normal.     Conjunctiva/sclera: Conjunctivae normal.     Right eye: Right conjunctiva is not injected. No chemosis, exudate or hemorrhage.     Comments: No  foreign bodies noted on exam. No fluorescein uptake   Cardiovascular:     Rate and Rhythm: Normal rate.  Pulmonary:     Effort: Pulmonary effort is normal.  Musculoskeletal:        General: Normal range of motion.     Cervical back: Normal range of  motion.  Skin:    General: Skin is warm and dry.  Neurological:     Mental Status: He is alert and oriented to person, place, and time.  Psychiatric:        Mood and Affect: Mood and affect normal.        Behavior: Behavior normal.      UC Treatments / Results  Labs (all labs ordered are listed, but only abnormal results are displayed) Labs Reviewed - No data to display  EKG   Radiology No results found.  Procedures Procedures (including critical care time)  Medications Ordered in UC Medications - No data to display  Initial Impression / Assessment and Plan / UC Course  I have reviewed the triage vital signs and the nursing notes.  Pertinent labs & imaging results that were available during my care of the patient were reviewed by me and considered in my medical decision making (see chart for details).    Pt c/o foreign body sensation in his Right eye No vision change or pain at this time No foreign bodies or evidence of corneal abrasion on exam Encouraged f/u with his eye specialist in the morning  Final Clinical Impressions(s) / UC Diagnoses   Final diagnoses:  Corneal irritation of right eye     Discharge Instructions      Please use the resource guide to schedule an appointment with an eye specialist tomorrow for further evaluation and treatment of your eye irritation.     ED Prescriptions    None     PDMP not reviewed this encounter.   Phelps, Erin O, PA-C 02/28/20 2240  

## 2020-02-27 NOTE — Discharge Instructions (Signed)
°  Please use the resource guide to schedule an appointment with an eye specialist tomorrow for further evaluation and treatment of your eye irritation.

## 2020-02-27 NOTE — ED Triage Notes (Signed)
Patient presents to Urgent Care with complaints of pain in his right eye since he was helping clear the limb off his daughter's car after a tree fell on it. Patient reports there was glass everywhere and he thinks some of it got in his eye. No vision changes, no redness noted.

## 2020-03-09 ENCOUNTER — Other Ambulatory Visit: Payer: Self-pay

## 2020-03-09 ENCOUNTER — Emergency Department (INDEPENDENT_AMBULATORY_CARE_PROVIDER_SITE_OTHER): Payer: Medicare Other

## 2020-03-09 ENCOUNTER — Emergency Department (INDEPENDENT_AMBULATORY_CARE_PROVIDER_SITE_OTHER)
Admission: EM | Admit: 2020-03-09 | Discharge: 2020-03-09 | Disposition: A | Discharge status: Home / Self Care | Payer: Medicare Other | Source: Home / Self Care | Attending: Emergency Medicine | Admitting: Emergency Medicine

## 2020-03-09 DIAGNOSIS — R1030 Lower abdominal pain, unspecified: Secondary | ICD-10-CM | POA: Diagnosis not present

## 2020-03-09 LAB — POCT CBC W AUTO DIFF (K'VILLE URGENT CARE)

## 2020-03-09 LAB — POCT URINALYSIS DIP (MANUAL ENTRY)
Bilirubin, UA: NEGATIVE
Glucose, UA: 250 mg/dL — AB
Ketones, POC UA: NEGATIVE mg/dL
Nitrite, UA: NEGATIVE
Protein Ur, POC: 100 mg/dL — AB
Spec Grav, UA: 1.02 (ref 1.010–1.025)
Urobilinogen, UA: 0.2 E.U./dL
pH, UA: 8.5 — AB (ref 5.0–8.0)

## 2020-03-09 LAB — POCT FASTING CBG KUC MANUAL ENTRY: POCT Glucose (KUC): 156 mg/dL — AB (ref 70–99)

## 2020-03-09 IMAGING — DX DG ABDOMEN 1V
2 series · 2 of 2 positions shown · non-contrast
Comparison: None.

CLINICAL DATA: Peritoneal dialysis patient.  Lower abdominal pain.

EXAM:
ABDOMEN - 1 VIEW

[abdomen kub (1 of 2)]
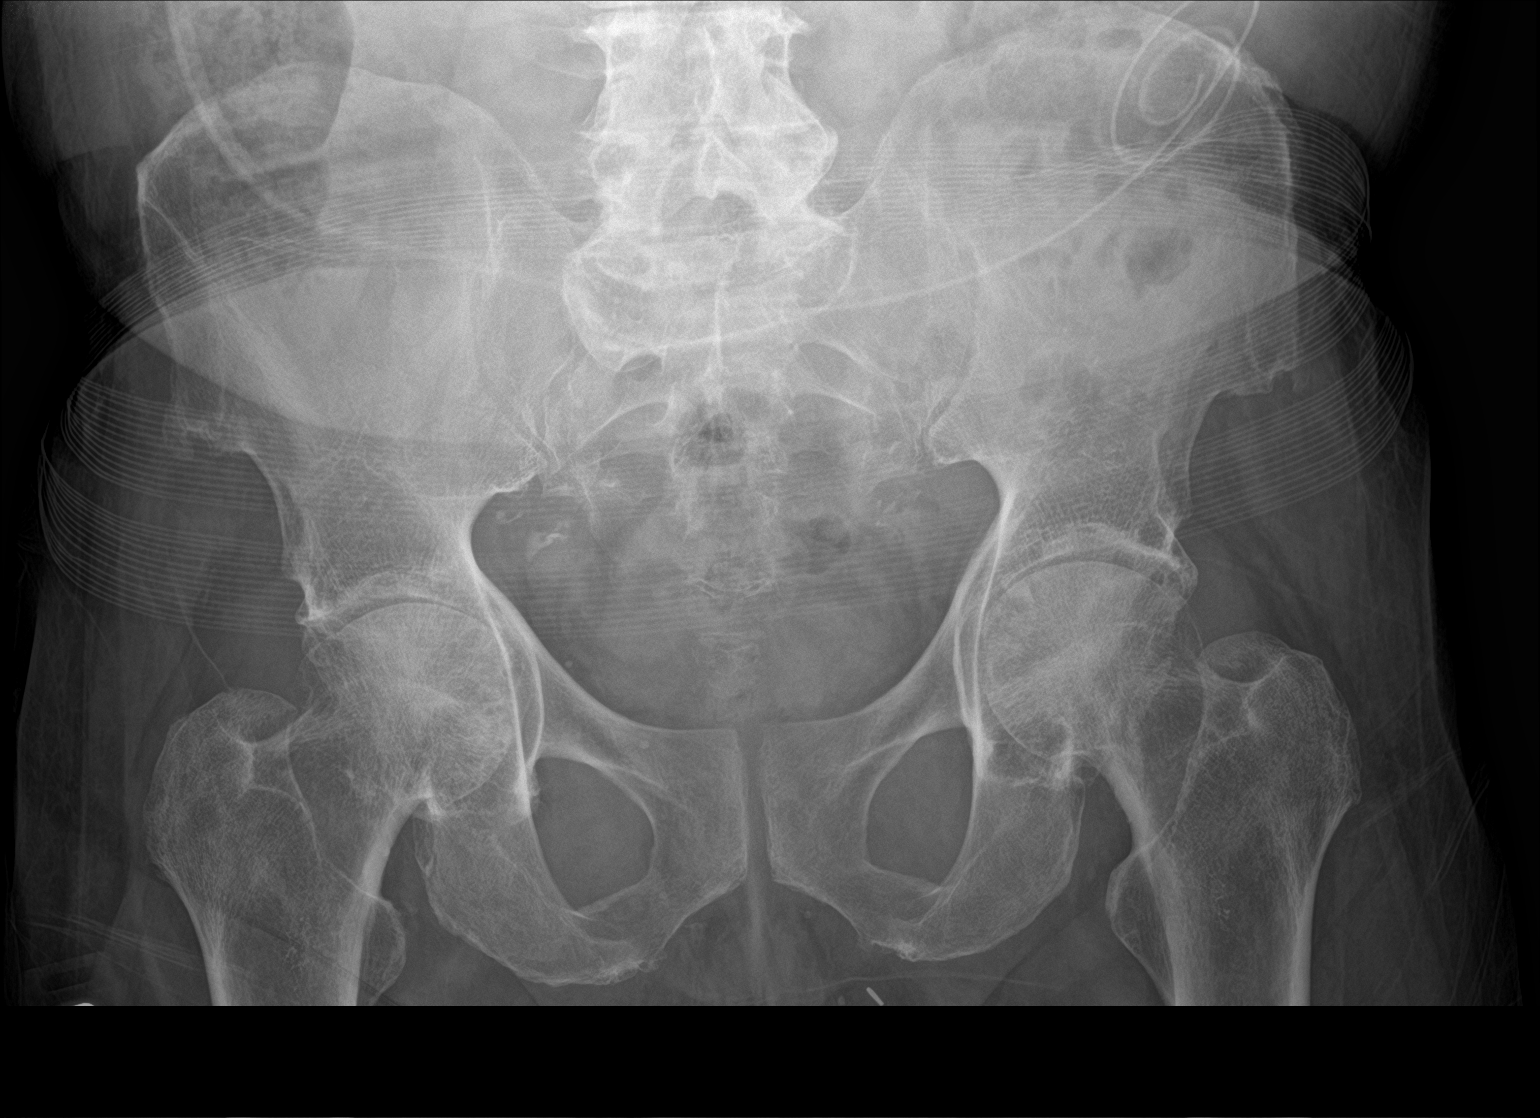

[abdomen kub (2 of 2)]
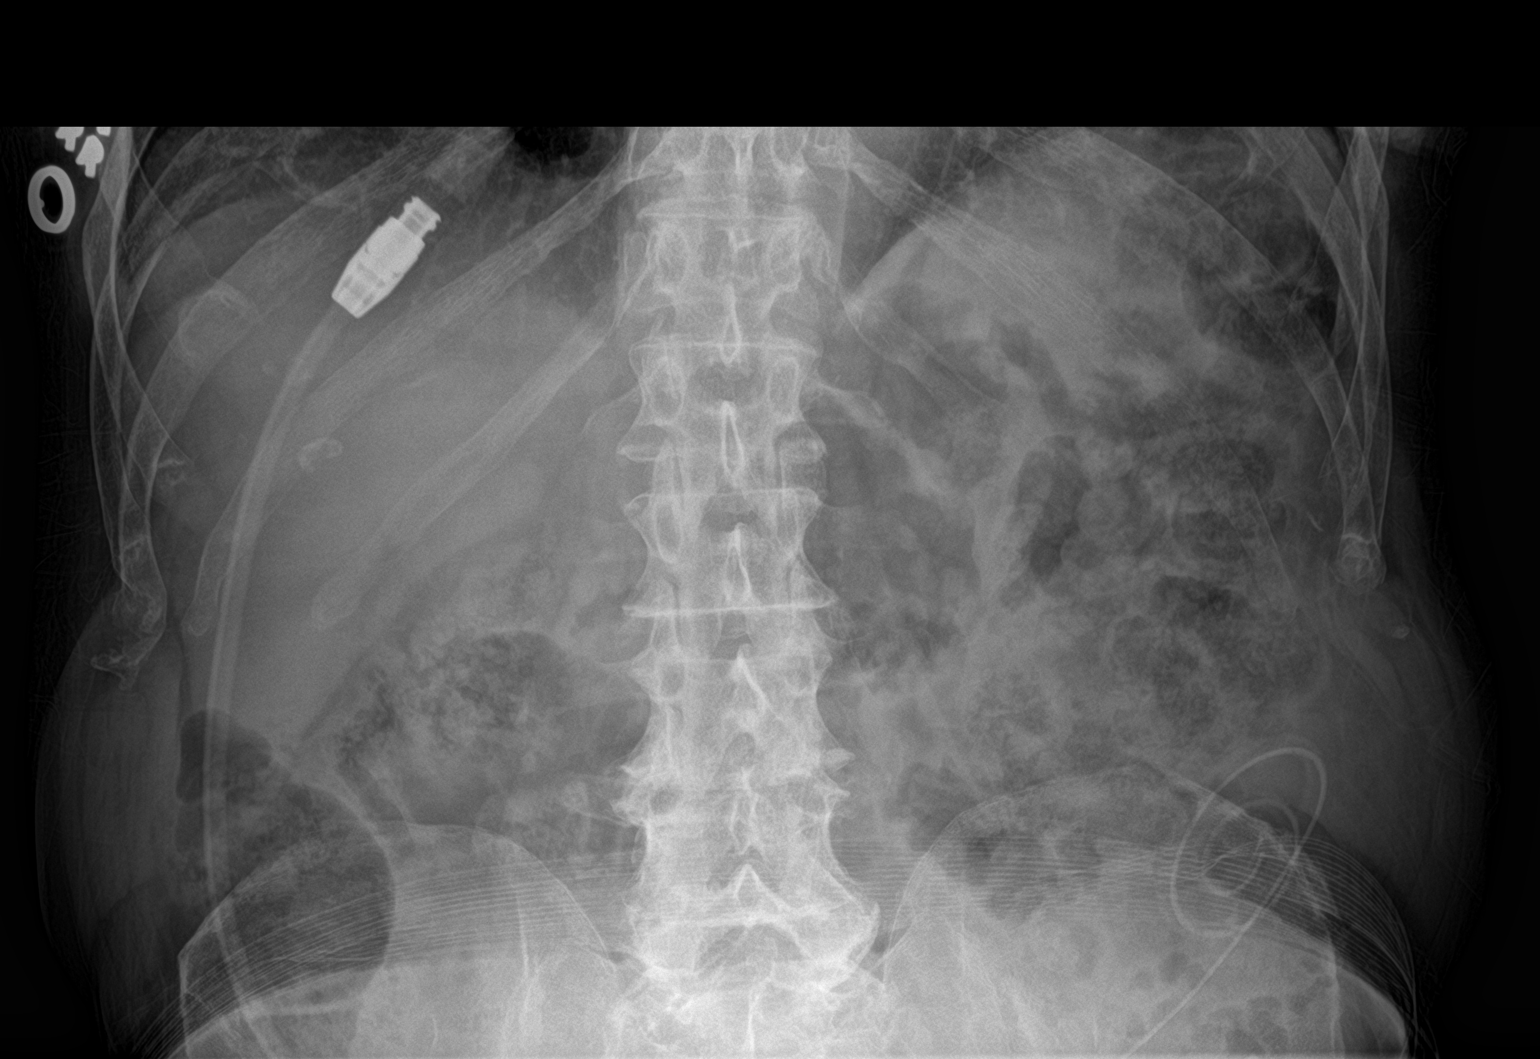

[2 of 2 positions shown; findings below may reference images not displayed]

FINDINGS: Peritoneal dialysis catheter coiled in the left lower quadrant. No
evidence of kink. No visible loculated fluid collections by
radiography. Ordinary degenerative changes affect the lumbar spine.
Ordinary osteoarthritis affects both hip joints. No sign of bowel
obstruction.
IMPRESSION: Peritoneal dialysis catheter coiled in the left lower quadrant. No
evidence of kink or other complicating feature.

## 2020-03-09 MED ORDER — CEPHALEXIN 500 MG PO CAPS: 500.0000 mg | ORAL_CAPSULE | Freq: Every day | ORAL | 0 refills | Status: AC

## 2020-03-09 NOTE — Discharge Instructions (Addendum)
Your urine is suggestive of a UTI, but I have sent off a urine culture to confirm the diagnosis and make sure that we have you on the right antibiotic. I am putting you on Keflex adjusting the dose for your dialysis. Follow-up with your doctor in 2 days. Go immediately to the emergency department for fevers, nausea, vomiting, abdominal distention, if you get worse, or for other concerns.

## 2020-03-09 NOTE — ED Triage Notes (Signed)
Patient presents to Urgent Care with complaints of lower abdominal pain with sudden onset 3am today. Rates pain 8/10. RLQ PD catheter in place; site intact and clean no drainage no odor noted. Of note patient has peritoneal HD daily. LBM 03/09/2020. 1 episode of emesis this morning. Gasx this am otc.

## 2020-03-09 NOTE — ED Provider Notes (Signed)
HPI  SUBJECTIVE:  Brian Rose. is a 80 y.o. male who presents with constant dull low abdominal pain waking him up at 3 AM today.  He reports nausea and one episode of nonbilious, nonbloody emesis.  The pain has not changed since it started.  No fevers, abdominal distention, dysuria, urgency, frequency, cloudy or odorous urine, hematuria.  Had a normal bowel movement yesterday.  No pelvic, perineal pain.  No penile rash, discharge, testicular or scrotal pain or swelling.  He tried Gas-X without improvement in his symptoms.  No aggravating factors.  His pain is not associated with walking, movement, urination.  He states the car ride over here was not painful.  He states that he has not had a change in the amount or color of the drainage from his peritoneal dialysis site which is located in his right lower quadrant.  He denies erythema, pain, swelling in the catheter area.  He has a past medical history of hypertension, ERSD on peritoneal dialysis, GERD, diabetes.  States that his glucose has been running within normal limits, was 131 last night.  He has had no other abdominal surgeries other than the peritoneal dialysis placement.  No history of UTI, pyelonephritis, nephrolithiasis, prostatitis, small bowel obstruction.  OVF:IEPPIRJJOACZ, Gwen Her, MD   Past Medical History:  Diagnosis Date  . Chronic kidney disease   . Diabetes ([place])   . Hypertension     History reviewed. No pertinent surgical history.  Family History  Problem Relation Age of Onset  . Diabetes Mother   . Hypertension Mother   . Hypertension Father   . Heart attack Father 41       This is what he died from.  . Diabetes Sister   . Diabetes Brother   . Diabetes Brother   . Diabetes Sister     Social History   Tobacco Use  . Smoking status: Former Smoker    Packs/day: 1.00    Years: 20.00    Pack years: 20.00    Types: Cigars    Quit date: 09/06/1981    Years since quitting: 38.5  . Smokeless tobacco: Never Used   Substance Use Topics  . Alcohol use: No  . Drug use: No    No current facility-administered medications for this encounter.  Current Outpatient Medications:  .  allopurinol (ZYLOPRIM) 100 MG tablet, Take by mouth., Disp: , Rfl:  .  cephALEXin (KEFLEX) 500 MG capsule, Take 1 capsule (500 mg total) by mouth daily for 7 days., Disp: 7 capsule, Rfl: 0 .  famotidine (PEPCID) 20 MG tablet, Take by mouth., Disp: , Rfl:  .  glucose blood (PRODIGY NO CODING BLOOD GLUC) test strip, 2 (two) times daily., Disp: , Rfl:  .  aspirin 81 MG tablet, Take 81 mg by mouth 2 (two) times daily., Disp: , Rfl:  .  bisacodyl (BISACODYL LAXATIVE) 10 MG suppository, Place 1 suppository (10 mg total) rectally as needed for moderate constipation., Disp: 12 suppository, Rfl: 0 .  Blood Glucose Monitoring Suppl (BAYER CONTOUR MONITOR) W/DEVICE KIT, Use as needed., Disp: 1 each, Rfl: 0 .  Bromfenac Sodium (BROMSITE) 0.075 % SOLN, Apply to eye., Disp: , Rfl:  .  carvedilol (COREG) 6.25 MG tablet, Take 1 tablet (6.25 mg total) by mouth 2 (two) times daily with a meal., Disp: 60 tablet, Rfl: 3 .  colchicine 0.6 MG tablet, 0.3 mg (1/2 tab) no more than twice weekly during flares, Disp: 30 tablet, Rfl: 1 .  Cyanocobalamin (B-12) 1000  MCG CAPS, Take by mouth., Disp: , Rfl:  .  lactulose (CHRONULAC) 10 GM/15ML solution, Take 15 mLs (10 g total) by mouth 2 (two) times daily., Disp: 236 mL, Rfl: 0 .  moxifloxacin (VIGAMOX) 0.5 % ophthalmic solution, Place 1 drop into the left eye 3 (three) times a day., Disp: , Rfl:  .  MULTIPLE VITAMINS-MINERALS PO, Take 1 tablet by mouth every morning., Disp: , Rfl:  .  Niacin (VITAMIN B-3 PO), Take by mouth., Disp: , Rfl:  .  Omega-3 Fatty Acids (FISH OIL) 1000 MG CAPS, Take by mouth., Disp: , Rfl:  .  pantoprazole (PROTONIX) 40 MG tablet, Take 1 tablet (40 mg total) by mouth 2 (two) times daily before a meal., Disp: 60 tablet, Rfl: 3 .  pioglitazone (ACTOS) 30 MG tablet, Take 1 tablet (30 mg  total) by mouth daily., Disp: 90 tablet, Rfl: 3 .  pyridoxine (B-6) 100 MG tablet, Take 100 mg by mouth daily., Disp: , Rfl:  .  torsemide (DEMADEX) 100 MG tablet, Take 1 tablet (100 mg total) by mouth daily., Disp: 30 tablet, Rfl: 3  No Known Allergies   ROS  As noted in HPI.   Physical Exam  BP (!) 171/69 (BP Location: Left Arm)   Pulse 86   Temp 98.6 F (37 C) (Oral)   Resp 15   Ht $R'5\' 9"'il$  (1.753 m)   Wt 76.7 kg   SpO2 97%   BMI 24.96 kg/m   Constitutional: Well developed, well nourished, no acute distress Eyes:  EOMI, conjunctiva normal bilaterally HENT: Normocephalic, atraumatic,mucus membranes moist Respiratory: Normal inspiratory effort Cardiovascular: Normal rate GI: nondistended.  Normal appearance.  Soft, nontender.  Positive peritoneal dialysis catheter right lower quadrant.  No surrounding erythema, induration, increased temperature, tenderness, expressible purulent drainage.  Hypoactive bowel sounds.  No rebound, guarding.  No mass.  No ascites. Back: No CVAT skin: No rash, skin intact Musculoskeletal: no deformities Neurologic: Alert & oriented x 3, no focal neuro deficits Psychiatric: Speech and behavior appropriate   ED Course   Medications - No data to display  Orders Placed This Encounter  Procedures  . DG Abdomen 1 View    Standing Status:   Standing    Number of Occurrences:   1    Order Specific Question:   Reason for Exam (SYMPTOM  OR DIAGNOSIS REQUIRED)    Answer:   Lower abdominal pain, peritoneal dialysis.  Marland Kitchen POCT CBC w auto diff    Standing Status:   Standing    Number of Occurrences:   1  . POCT urinalysis dipstick    Standing Status:   Standing    Number of Occurrences:   1  . POCT fasting CBG (manual entry)    Standing Status:   Standing    Number of Occurrences:   1    No results found for this or any previous visit (from the past 24 hour(s)). DG Abdomen 1 View  Result Date: 03/09/2020 CLINICAL DATA:  Peritoneal dialysis  patient.  Lower abdominal pain. EXAM: ABDOMEN - 1 VIEW COMPARISON:  None. FINDINGS: Peritoneal dialysis catheter coiled in the left lower quadrant. No evidence of kink. No visible loculated fluid collections by radiography. Ordinary degenerative changes affect the lumbar spine. Ordinary osteoarthritis affects both hip joints. No sign of bowel obstruction. IMPRESSION: Peritoneal dialysis catheter coiled in the left lower quadrant. No evidence of kink or other complicating feature. Electronically Signed   By: Nelson Chimes M.D.   On: 03/09/2020 09:43  ED Clinical Impression  1. Lower abdominal pain      ED Assessment/Plan  We will check a urine and CBC, KUB looking for misplaced peritoneal dialysis catheter or kinking in the system.  His abdomen is benign.  No fever, peritoneal signs. Do not think this is prostatitis as he denies pelvic pain.  CBC: No leukocytosis, hemoglobin 10.2, hematocrit 33.5. Fingerstick normal  Reviewed imaging independently. No evidence of kink or other complicating feature. see radiology report for full details.  UA with small leukocytes, proteinuria, hematuria, glucosuria. No ketones. We will send off for culture to confirm presence of UTI. Will treat as a UTI with Keflex 500 mg once a day per up-to-date recommendations because of the peritoneal dialysis for 7 days..  Discussed labs, imaging, MDM, treatment plan, and plan for follow-up with patient. Discussed sn/sx that should prompt return to the ED. patient agrees with plan.   Meds ordered this encounter  Medications  . cephALEXin (KEFLEX) 500 MG capsule    Sig: Take 1 capsule (500 mg total) by mouth daily for 7 days.    Dispense:  7 capsule    Refill:  0    *This clinic note was created using [employer]. Therefore, there may be occasional mistakes despite careful proofreading.   ?   Melynda Ripple, MD 03/10/20 1213

## 2020-03-12 LAB — URINALYSIS W MICROSCOPIC + REFLEX CULTURE

## 2020-03-13 ENCOUNTER — Telehealth (HOSPITAL_COMMUNITY): Payer: Self-pay | Admitting: Emergency Medicine

## 2020-03-13 NOTE — Telephone Encounter (Signed)
Urine culture was canceled.  Called patient today, advised him that he needs to come in and provide another specimen.  Patient states that his abdominal pain is worsening, he has a fever of 100.1 today.  He is going to call his primary care provider now and get an appointment.  Recommend collecting another urine specimen at this visit, checking for UTI, and if abnormal, sending off for culture again.  Encouraged him to seek medical attention today.  Patient agrees with plan.

## 2020-03-20 ENCOUNTER — Ambulatory Visit (INDEPENDENT_AMBULATORY_CARE_PROVIDER_SITE_OTHER): Payer: Medicare Other | Admitting: Sports Medicine

## 2020-03-20 ENCOUNTER — Other Ambulatory Visit: Payer: Self-pay

## 2020-03-20 ENCOUNTER — Encounter: Payer: Self-pay | Admitting: Sports Medicine

## 2020-03-20 DIAGNOSIS — K219 Gastro-esophageal reflux disease without esophagitis: Secondary | ICD-10-CM

## 2020-03-20 DIAGNOSIS — R6889 Other general symptoms and signs: Secondary | ICD-10-CM | POA: Diagnosis not present

## 2020-03-20 DIAGNOSIS — R69 Illness, unspecified: Secondary | ICD-10-CM | POA: Insufficient documentation

## 2020-03-20 MED ORDER — AZITHROMYCIN 250 MG PO TABS: ORAL_TABLET | ORAL | 0 refills | Status: DC

## 2020-03-20 MED ORDER — FLUTICASONE PROPIONATE 50 MCG/ACT NA SUSP: NASAL | 3 refills | Status: DC

## 2020-03-20 MED ORDER — PANTOPRAZOLE SODIUM 40 MG PO TBEC: 40.0000 mg | DELAYED_RELEASE_TABLET | Freq: Two times a day (BID) | ORAL | 3 refills | Status: DC

## 2020-03-20 NOTE — Progress Notes (Signed)
    Procedures performed today:    None.  Independent interpretation of notes and tests performed by another provider:   None.  Brief History, Exam, Impression, and Recommendations:    Feeling sick This is a pleasant 80 year old male, with a complex medical history including end-stage renal disease on peritoneal dialysis, diabetes mellitus type 2. For the past couple of weeks he has been feeling sick, facial pain and pressure, runny nose, he also had some abdominal pain, and abnormal urine. He was seen in urgent care, antibiotics were started which improved his abdominal symptoms. He then saw his nephrologist, CBC, CMP were obtained, nothing acute noted. He had a COVID-19 test at the onset of the symptoms, both antigen and a PCR both of which were negative. Urine culture was sent off. He also has significant midepigastric pain, and substernal pain, he describes this is acid reflux, it is nonexertional and does not radiate to the neck or the jaw or the arm. Belly is soft on exam.  Oropharyngeal as well as nasopharyngeal exams are unremarkable He has used pantoprazole in the past which has helped, we went to restart pantoprazole to be used twice a day, he does desire referral to gastroenterology, providing this as well. Adding some azithromycin and Flonase for a potential maxillary sinusitis. Return to see me in about a month.    ___________________________________________ Gwen Her. Dianah Field, M.D., ABFM., CAQSM. Primary Care and [place] Instructor of [place] [employer] [facility] [employer]

## 2020-03-20 NOTE — Assessment & Plan Note (Addendum)
This is a pleasant 80 year old male, with a complex medical history including end-stage renal disease on peritoneal dialysis, diabetes mellitus type 2. For the past couple of weeks he has been feeling sick, facial pain and pressure, runny nose, he also had some abdominal pain, and abnormal urine. He was seen in urgent care, antibiotics were started which improved his abdominal symptoms. He then saw his nephrologist, CBC, CMP were obtained, nothing acute noted. He had a COVID-19 test at the onset of the symptoms, both antigen and a PCR both of which were negative. Urine culture was sent off. He also has significant midepigastric pain, and substernal pain, he describes this is acid reflux, it is nonexertional and does not radiate to the neck or the jaw or the arm. Belly is soft on exam.  Oropharyngeal as well as nasopharyngeal exams are unremarkable He has used pantoprazole in the past which has helped, we went to restart pantoprazole to be used twice a day, he does desire referral to gastroenterology, providing this as well. Adding some azithromycin and Flonase for a potential maxillary sinusitis. Return to see me in about a month.

## 2020-03-27 ENCOUNTER — Encounter: Payer: Self-pay | Admitting: Gastroenterology

## 2020-03-27 ENCOUNTER — Ambulatory Visit: Payer: Medicare Other | Admitting: Sports Medicine

## 2020-03-28 DIAGNOSIS — N12 Tubulo-interstitial nephritis, not specified as acute or chronic: Secondary | ICD-10-CM | POA: Insufficient documentation

## 2020-03-28 DIAGNOSIS — N186 End stage renal disease: Secondary | ICD-10-CM | POA: Insufficient documentation

## 2020-03-28 DIAGNOSIS — A419 Sepsis, unspecified organism: Secondary | ICD-10-CM | POA: Insufficient documentation

## 2020-03-28 DIAGNOSIS — Z992 Dependence on renal dialysis: Secondary | ICD-10-CM | POA: Insufficient documentation

## 2020-03-29 DIAGNOSIS — R7881 Bacteremia: Secondary | ICD-10-CM | POA: Insufficient documentation

## 2020-04-12 ENCOUNTER — Telehealth: Payer: Self-pay

## 2020-04-12 ENCOUNTER — Ambulatory Visit: Payer: Medicare Other | Admitting: Gastroenterology

## 2020-04-12 NOTE — Telephone Encounter (Signed)
Transition Care Management Unsuccessful Follow-up Telephone Call  Date of discharge and from where:  04/11/2020 from [facility].   Attempts:  1st Attempt  Reason for unsuccessful TCM follow-up call:  Left voice message

## 2020-04-13 NOTE — Telephone Encounter (Signed)
Transition Care Management Unsuccessful Follow-up Telephone Call  Date of discharge and from where:  04/11/2020 from [facility]  Attempts:  2nd Attempt  Reason for unsuccessful TCM follow-up call:  Left voice message

## 2020-04-16 ENCOUNTER — Encounter: Payer: Self-pay | Admitting: Sports Medicine

## 2020-04-19 ENCOUNTER — Encounter: Payer: Self-pay | Admitting: Sports Medicine

## 2020-04-19 ENCOUNTER — Ambulatory Visit (INDEPENDENT_AMBULATORY_CARE_PROVIDER_SITE_OTHER): Payer: Medicare Other | Admitting: Sports Medicine

## 2020-04-19 ENCOUNTER — Other Ambulatory Visit: Payer: Self-pay

## 2020-04-19 DIAGNOSIS — R627 Adult failure to thrive: Secondary | ICD-10-CM | POA: Diagnosis not present

## 2020-04-19 DIAGNOSIS — A419 Sepsis, unspecified organism: Secondary | ICD-10-CM

## 2020-04-19 DIAGNOSIS — R6521 Severe sepsis with septic shock: Secondary | ICD-10-CM | POA: Diagnosis not present

## 2020-04-19 MED ORDER — MIRTAZAPINE 15 MG PO TABS: 15.0000 mg | ORAL_TABLET | Freq: Every day | ORAL | 3 refills | Status: DC

## 2020-04-19 MED ORDER — MIDODRINE HCL 2.5 MG PO TABS: 2.5000 mg | ORAL_TABLET | Freq: Three times a day (TID) | ORAL | 0 refills | Status: DC

## 2020-04-19 NOTE — Assessment & Plan Note (Signed)
This is a pleasant 80 year old male, he has end-stage renal disease, recently admitted for septic shock, suspected to be due to a pyelonephritis noted on CT scan. E. coli bacteremia noted. Discharged on Cipro for 21 days. He still feels somewhat weak, blood pressures do tend to bottom out in the 0000000 systolic. 134/58 today here in the office. He was significantly hyponatremic and anemic during his hospitalization with sodiums down to the 120s and hemoglobin down into the sevens, he did receive a blood transfusion. He saw his nephrologist, hemoglobin is up to the eights, sodium up to the 130s. Certainly fludrocortisone would provide mineralocorticoid activity improve his blood pressure however with end-stage renal disease on dialysis I am concerned about its efficacy as it does act on the kidney. We will add midodrine half dose daily, he will liberalize the sodium in his diet, hydrate aggressively.

## 2020-04-19 NOTE — Assessment & Plan Note (Signed)
Brian Rose has had a progressive decline over the past several months, he is losing weight, he has ended up in the hospital. On questioning he may be a bit depressed as well. Adding mirtazapine, this will improve appetite and improve his mood.

## 2020-04-19 NOTE — Progress Notes (Signed)
    Procedures performed today:    None.  Independent interpretation of notes and tests performed by another provider:   None.  Brief History, Exam, Impression, and Recommendations:    Septic shock ([place]) This is a pleasant 80 year old male, he has end-stage renal disease, recently admitted for septic shock, suspected to be due to a pyelonephritis noted on CT scan. E. coli bacteremia noted. Discharged on Cipro for 21 days. He still feels somewhat weak, blood pressures do tend to bottom out in the 0000000 systolic. 134/58 today here in the office. He was significantly hyponatremic and anemic during his hospitalization with sodiums down to the 120s and hemoglobin down into the sevens, he did receive a blood transfusion. He saw his nephrologist, hemoglobin is up to the eights, sodium up to the 130s. Certainly fludrocortisone would provide mineralocorticoid activity improve his blood pressure however with end-stage renal disease on dialysis I am concerned about its efficacy as it does act on the kidney. We will add midodrine half dose daily, he will liberalize the sodium in his diet, hydrate aggressively.  Failure to thrive in adult Brian Rose has had a progressive decline over the past several months, he is losing weight, he has ended up in the hospital. On questioning he may be a bit depressed as well. Adding mirtazapine, this will improve appetite and improve his mood.     ___________________________________________ Gwen Her. Dianah Field, M.D., ABFM., CAQSM. Primary Care and [place] Instructor of [place] [employer] [facility] [employer]

## 2020-04-30 ENCOUNTER — Telehealth: Payer: Self-pay | Admitting: Sports Medicine

## 2020-04-30 ENCOUNTER — Telehealth: Payer: Self-pay | Admitting: *Deleted

## 2020-04-30 NOTE — Telephone Encounter (Signed)
Transition Care Management Follow-up Telephone Call  Date of discharge and from where: 04-28-2020  [facility]   How have you been since you were released from the hospital?  Feeling better  Any questions or concerns? No  Items Reviewed:  Did the pt receive and understand the discharge instructions provided? Yes   Medications obtained and verified? Yes   Other? na  Any new allergies since your discharge? No   Dietary orders reviewed? na  Do you have support at home? Yes   Functional Questionnaire: (I = Independent and D = Dependent) ADLs:  I  Bathing/Dressing- I  Meal Prep- I  Eating- I  Maintaining continence- I  Transferring/Ambulation- I  Managing Meds- I  Follow up appointments reviewed:   PCP Hospital f/u appt confirmed? No orthopedics 1   Specialist Hospital f/u appt confirmed? No  Scheduled with orthopedics next week   Are transportation arrangements needed? No   Patient will call pcp if this changes  If their condition worsens, is the pt aware to call PCP or go to the Emergency Dept.?  Yes   t provided with contact information for the PCP's office or ED?  yes  Was to pt encouraged to call back with questions or concerns? yes

## 2020-04-30 NOTE — Telephone Encounter (Signed)
Patient wanted to know if he can get help with transportation to one of his appointments. Was told to contact his PCP for help.

## 2020-05-02 ENCOUNTER — Encounter: Payer: Self-pay | Admitting: Family Medicine

## 2020-05-02 ENCOUNTER — Telehealth (INDEPENDENT_AMBULATORY_CARE_PROVIDER_SITE_OTHER): Payer: Medicare Other | Admitting: Family Medicine

## 2020-05-02 VITALS — BP 147/71 | HR 109

## 2020-05-02 DIAGNOSIS — K219 Gastro-esophageal reflux disease without esophagitis: Secondary | ICD-10-CM | POA: Diagnosis not present

## 2020-05-02 DIAGNOSIS — R1314 Dysphagia, pharyngoesophageal phase: Secondary | ICD-10-CM | POA: Diagnosis not present

## 2020-05-02 NOTE — Assessment & Plan Note (Signed)
Symptoms of dysphagia.  No other symptoms suggestive of CVA and likely related to his uncontrolled reflux.  Will have him re-start protonix '40mg'$  BID and referral placed to GI.

## 2020-05-02 NOTE — Progress Notes (Signed)
Brian Rose. - 80 y.o. male MRN [id number]  Date of birth: 06/27/1940   This visit type was conducted due to national recommendations for restrictions regarding the COVID-19 Pandemic (e.g. social distancing).  This format is felt to be most appropriate for this patient at this time.  All issues noted in this document were discussed and addressed.  No physical exam was performed (except for noted visual exam findings with Video Visits).  I discussed the limitations of evaluation and management by telemedicine and the availability of in person appointments. The patient expressed understanding and agreed to proceed.  I connected with@ on 05/02/20 at  2:00 PM EST by a video enabled telemedicine application and verified that I am speaking with the correct person using two identifiers.  Interactive audio and video telecommunications were attempted between this provider and patient, however failed, due to patient having technical difficulties OR patient did not have access to video capability.  We continued and completed visit with audio only.    Present at visit: Luetta Nutting, DO Leroy Kennedy.   Patient Location: Home [address] [place] [address]   Provider location:   [facility]  Chief Complaint  Patient presents with  . Dysphagia    HPI  Brian Rose. is a 80 y.o. male who presents via audio/video conferencing for a telehealth visit today.  He has complaint today of swallowing difficulty.  Reports that when he swallows he feels like food gets stuck in the back his throat.  He has to take large gulps of water to get the food to go down.  This only occurs with solid foods.  He denies difficulty with liquids and does not have choking or coughing with swallowing.  He has also had some hoarseness over the past month.  He denies slurred speech, facial droop or new weakness.  He does have GERD that is not well controlled.  He stopped protonix about 1.5 months ago because he wasn't  sure that it was helping very much.    ROS:  A comprehensive ROS was completed and negative except as noted per HPI  Past Medical History:  Diagnosis Date  . Chronic kidney disease   . Diabetes ([place])   . Hypertension     No past surgical history on file.  Family History  Problem Relation Age of Onset  . Diabetes Mother   . Hypertension Mother   . Hypertension Father   . Heart attack Father 69       This is what he died from.  . Diabetes Sister   . Diabetes Brother   . Diabetes Brother   . Diabetes Sister     Social History   Socioeconomic History  . Marital status: Married    Spouse name: Not on file  . Number of children: Not on file  . Years of education: Not on file  . Highest education level: Not on file  Occupational History  . Not on file  Tobacco Use  . Smoking status: Former Smoker    Packs/day: 1.00    Years: 20.00    Pack years: 20.00    Types: Cigars    Quit date: 09/06/1981    Years since quitting: 38.6  . Smokeless tobacco: Never Used  Substance and Sexual Activity  . Alcohol use: No  . Drug use: No  . Sexual activity: Yes    Partners: Female  Other Topics Concern  . Not on file  Social History Narrative  . Not  on file   Social Determinants of Health   Financial Resource Strain: Not on file  Food Insecurity: Not on file  Transportation Needs: Not on file  Physical Activity: Not on file  Stress: Not on file  Social Connections: Not on file  Intimate Partner Violence: Not on file     Current Outpatient Medications:  .  allopurinol (ZYLOPRIM) 100 MG tablet, Take by mouth., Disp: , Rfl:  .  aspirin 81 MG tablet, Take 81 mg by mouth 2 (two) times daily., Disp: , Rfl:  .  azithromycin (ZITHROMAX Z-PAK) 250 MG tablet, Take 2 tablets (500 mg) on  Day 1,  followed by 1 tablet (250 mg) once daily on Days 2 through 5., Disp: 6 tablet, Rfl: 0 .  bisacodyl (BISACODYL LAXATIVE) 10 MG suppository, Place 1 suppository (10 mg total) rectally as  needed for moderate constipation., Disp: 12 suppository, Rfl: 0 .  Cyanocobalamin (B-12) 1000 MCG CAPS, Take by mouth., Disp: , Rfl:  .  midodrine (PROAMATINE) 2.5 MG tablet, Take 1 tablet (2.5 mg total) by mouth 3 (three) times daily with meals., Disp: 30 tablet, Rfl: 0 .  mirtazapine (REMERON) 15 MG tablet, Take 1 tablet (15 mg total) by mouth at bedtime., Disp: 30 tablet, Rfl: 3 .  Omega-3 Fatty Acids (FISH OIL) 1000 MG CAPS, Take by mouth., Disp: , Rfl:  .  pioglitazone (ACTOS) 30 MG tablet, Take 1 tablet (30 mg total) by mouth daily., Disp: 90 tablet, Rfl: 3 .  Blood Glucose Monitoring Suppl (BAYER CONTOUR MONITOR) W/DEVICE KIT, Use as needed., Disp: 1 each, Rfl: 0 .  Bromfenac Sodium (BROMSITE) 0.075 % SOLN, Apply to eye. (Patient not taking: Reported on 05/02/2020), Disp: , Rfl:  .  carvedilol (COREG) 6.25 MG tablet, Take 1 tablet (6.25 mg total) by mouth 2 (two) times daily with a meal. (Patient not taking: Reported on 05/02/2020), Disp: 60 tablet, Rfl: 3 .  colchicine 0.6 MG tablet, 0.3 mg (1/2 tab) no more than twice weekly during flares (Patient not taking: Reported on 05/02/2020), Disp: 30 tablet, Rfl: 1 .  famotidine (PEPCID) 20 MG tablet, Take by mouth. (Patient not taking: Reported on 05/02/2020), Disp: , Rfl:  .  fluticasone (FLONASE) 50 MCG/ACT nasal spray, One spray in each nostril twice a day (Patient not taking: Reported on 05/02/2020), Disp: 48 g, Rfl: 3 .  glucose blood (PRODIGY NO CODING BLOOD GLUC) test strip, 2 (two) times daily., Disp: , Rfl:  .  lactulose (CHRONULAC) 10 GM/15ML solution, Take 15 mLs (10 g total) by mouth 2 (two) times daily. (Patient not taking: Reported on 05/02/2020), Disp: 236 mL, Rfl: 0 .  moxifloxacin (VIGAMOX) 0.5 % ophthalmic solution, Place 1 drop into the left eye 3 (three) times a day. (Patient not taking: Reported on 05/02/2020), Disp: , Rfl:  .  MULTIPLE VITAMINS-MINERALS PO, Take 1 tablet by mouth every morning. (Patient not taking: Reported on 05/02/2020),  Disp: , Rfl:  .  Niacin (VITAMIN B-3 PO), Take by mouth. (Patient not taking: Reported on 05/02/2020), Disp: , Rfl:  .  pantoprazole (PROTONIX) 40 MG tablet, Take 1 tablet (40 mg total) by mouth 2 (two) times daily before a meal. (Patient not taking: Reported on 05/02/2020), Disp: 60 tablet, Rfl: 3 .  pyridoxine (B-6) 100 MG tablet, Take 100 mg by mouth daily. (Patient not taking: Reported on 05/02/2020), Disp: , Rfl:  .  torsemide (DEMADEX) 100 MG tablet, Take 1 tablet (100 mg total) by mouth daily. (Patient not taking: Reported  on 05/02/2020), Disp: 30 tablet, Rfl: 3  EXAM:  VITALS per patient if applicable: BP (!) 170/01   Pulse (!) 109   GENERAL: alert, oriented,no acute distress  PSYCH/NEURO: pleasant and cooperative, no obvious depression or anxiety, speech and thought processing grossly intact  ASSESSMENT AND PLAN:  Discussed the following assessment and plan:  Pharyngoesophageal dysphagia Symptoms of dysphagia.  No other symptoms suggestive of CVA and likely related to his uncontrolled reflux.  Will have him re-start protonix 52m BID and referral placed to GI.    22 minutes spent including pre visit preparation, review of prior notes and labs, encounter with patient via telephone visit and same day documentation.     I discussed the assessment and treatment plan with the patient. The patient was provided an opportunity to ask questions and all were answered. The patient agreed with the plan and demonstrated an understanding of the instructions.   The patient was advised to call back or seek an in-person evaluation if the symptoms worsen or if the condition fails to improve as anticipated.    CLuetta Nutting DO

## 2020-05-02 NOTE — Progress Notes (Signed)
Having problems swallowing x 2 weeks.  After eating, takes water for food to move down. Hoarse voice x 1 month.

## 2020-05-14 ENCOUNTER — Telehealth: Payer: Self-pay

## 2020-05-14 NOTE — Telephone Encounter (Signed)
Patient called and sent a mychart message about his flaking skin. He reports taking the Remeron for approximately 3 days and noticed the flaking skin. He stopped the medication until he hears from you that this is not from the medication.

## 2020-05-14 NOTE — Telephone Encounter (Signed)
Sounds like it was not from the Remeron, he can restart and then of course I'd need to see him to diagnose a derm complaint.  Was he overall feeling better with the Remeron?

## 2020-05-15 NOTE — Telephone Encounter (Signed)
Spoke with patient who reports that dry skin was in the patient education for this medication. He states that it has improved since he stopped the medication and he started oiling his skin.

## 2020-05-17 ENCOUNTER — Ambulatory Visit: Payer: Medicare Other | Admitting: Sports Medicine

## 2020-05-24 ENCOUNTER — Telehealth: Payer: Self-pay

## 2020-05-24 NOTE — Telephone Encounter (Signed)
Brian Rose w/ [facility] left VM stating that they needed to confirm patient's diagnosis.   Called and was able to confirm Gout and GERD. They are still looking for liver disease which they state was documented. Does patient have this diagnosis? Please advise.

## 2020-05-24 NOTE — Telephone Encounter (Signed)
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

## 2020-05-24 NOTE — Telephone Encounter (Signed)
Called back to [employer] and spoke with Vivien Rota. Confirmed the additional diagnosis.

## 2020-05-28 ENCOUNTER — Other Ambulatory Visit: Payer: Self-pay | Admitting: Sports Medicine

## 2020-06-14 ENCOUNTER — Other Ambulatory Visit: Payer: Self-pay | Admitting: Podiatry

## 2020-06-14 ENCOUNTER — Ambulatory Visit (INDEPENDENT_AMBULATORY_CARE_PROVIDER_SITE_OTHER): Payer: Medicare Other | Admitting: Podiatry

## 2020-06-14 ENCOUNTER — Ambulatory Visit (INDEPENDENT_AMBULATORY_CARE_PROVIDER_SITE_OTHER): Payer: Medicare Other

## 2020-06-14 ENCOUNTER — Other Ambulatory Visit: Payer: Self-pay

## 2020-06-14 DIAGNOSIS — M79671 Pain in right foot: Secondary | ICD-10-CM

## 2020-06-14 DIAGNOSIS — E1149 Type 2 diabetes mellitus with other diabetic neurological complication: Secondary | ICD-10-CM | POA: Diagnosis not present

## 2020-06-14 DIAGNOSIS — M79672 Pain in left foot: Secondary | ICD-10-CM

## 2020-06-14 DIAGNOSIS — M792 Neuralgia and neuritis, unspecified: Secondary | ICD-10-CM

## 2020-06-14 DIAGNOSIS — G629 Polyneuropathy, unspecified: Secondary | ICD-10-CM

## 2020-06-14 NOTE — Patient Instructions (Addendum)
You can use asper cream with lidocaine or Biofreeze to help with the sensations on the feet.   Leory Plowman and Daroff's neurology in clinical practice (8th ed., pp. [id number]). Elsevier."> Goldman-Cecil medicine (26th ed., pp. [phone]- [phone]). Elsevier.">  Peripheral Neuropathy Peripheral neuropathy is a type of nerve damage. It affects nerves that carry signals between the spinal cord and the arms, legs, and the rest of the body (peripheral nerves). It does not affect nerves in the spinal cord or brain. In peripheral neuropathy, one nerve or a group of nerves may be damaged. Peripheral neuropathy is a broad category that includes many specific nerve disorders, like diabetic neuropathy, hereditary neuropathy, and carpal tunnel syndrome. What are the causes? This condition may be caused by:  Diabetes. This is the most common cause of peripheral neuropathy.  Nerve injury.  Pressure or stress on a nerve that lasts a long time.  Lack (deficiency) of B vitamins. This can result from alcoholism, poor diet, or a restricted diet.  Infections.  Autoimmune diseases, such as rheumatoid arthritis and systemic lupus erythematosus.  Nerve diseases that are passed from parent to child (inherited).  Some medicines, such as cancer medicines (chemotherapy).  Poisonous (toxic) substances, such as lead and mercury.  Too little blood flowing to the legs.  Kidney disease.  Thyroid disease. In some cases, the cause of this condition is not known. What are the signs or symptoms? Symptoms of this condition depend on which of your nerves is damaged. Common symptoms include:  Loss of feeling (numbness) in the feet, hands, or both.  Tingling in the feet, hands, or both.  Burning pain.  Very sensitive skin.  Weakness.  Not being able to move a part of the body (paralysis).  Muscle twitching.  Clumsiness or poor coordination.  Loss of balance.  Not being able to control your bladder.  Feeling  dizzy.  Sexual problems. How is this diagnosed? Diagnosing and finding the cause of peripheral neuropathy can be difficult. Your health care provider will take your medical history and do a physical exam. A neurological exam will also be done. This involves checking things that are affected by your brain, spinal cord, and nerves (nervous system). For example, your health care provider will check your reflexes, how you move, and what you can feel. You may have other tests, such as:  Blood tests.  Electromyogram (EMG) and nerve conduction tests. These tests check nerve function and how well the nerves are controlling the muscles.  Imaging tests, such as CT scans or MRI to rule out other causes of your symptoms.  Removing a small piece of nerve to be examined in a lab (nerve biopsy).  Removing and examining a small amount of the fluid that surrounds the brain and spinal cord (lumbar puncture). How is this treated? Treatment for this condition may involve:  Treating the underlying cause of the neuropathy, such as diabetes, kidney disease, or vitamin deficiencies.  Stopping medicines that can cause neuropathy, such as chemotherapy.  Medicine to help relieve pain. Medicines may include: ? Prescription or over-the-counter pain medicine. ? Antiseizure medicine. ? Antidepressants. ? Pain-relieving patches that are applied to painful areas of skin.  Surgery to relieve pressure on a nerve or to destroy a nerve that is causing pain.  Physical therapy to help improve movement and balance.  Devices to help you move around (assistive devices). Follow these instructions at home: Medicines  Take over-the-counter and prescription medicines only as told by your health care provider. Do  not take any other medicines without first asking your health care provider.  Do not drive or use heavy machinery while taking prescription pain medicine. Lifestyle  Do not use any products that contain nicotine  or tobacco, such as cigarettes and e-cigarettes. Smoking keeps blood from reaching damaged nerves. If you need help quitting, ask your health care provider.  Avoid or limit alcohol. Too much alcohol can cause a vitamin B deficiency, and vitamin B is needed for healthy nerves.  Eat a healthy diet. This includes: ? Eating foods that are high in fiber, such as fresh fruits and vegetables, whole grains, and beans. ? Limiting foods that are high in fat and processed sugars, such as fried or sweet foods.   General instructions  If you have diabetes, work closely with your health care provider to keep your blood sugar under control.  If you have numbness in your feet: ? Check every day for signs of injury or infection. Watch for redness, warmth, and swelling. ? Wear padded socks and comfortable shoes. These help protect your feet.  Develop a good support system. Living with peripheral neuropathy can be stressful. Consider talking with a mental health specialist or joining a support group.  Use assistive devices and attend physical therapy as told by your health care provider. This may include using a walker or a cane.  Keep all follow-up visits as told by your health care provider. This is important.   Contact a health care provider if:  You have new signs or symptoms of peripheral neuropathy.  You are struggling emotionally from dealing with peripheral neuropathy.  Your pain is not well-controlled. Get help right away if:  You have an injury or infection that is not healing normally.  You develop new weakness in an arm or leg.  You have fallen or do so frequently. Summary  Peripheral neuropathy is when the nerves in the arms, or legs are damaged, resulting in numbness, weakness, or pain.  There are many causes of peripheral neuropathy, including diabetes, pinched nerves, vitamin deficiencies, autoimmune disease, and hereditary conditions.  Diagnosing and finding the cause of  peripheral neuropathy can be difficult. Your health care provider will take your medical history, do a physical exam, and do tests, including blood tests and nerve function tests.  Treatment involves treating the underlying cause of the neuropathy and taking medicines to help control pain. Physical therapy and assistive devices may also help. This information is not intended to replace advice given to you by your health care provider. Make sure you discuss any questions you have with your health care provider. Document Revised: 11/22/2019 Document Reviewed: 11/22/2019 Elsevier Patient Education  2021 [employer].

## 2020-06-15 ENCOUNTER — Emergency Department (INDEPENDENT_AMBULATORY_CARE_PROVIDER_SITE_OTHER)
Admission: RE | Admit: 2020-06-15 | Discharge: 2020-06-15 | Disposition: A | Discharge status: Home / Self Care | Payer: Medicare Other | Source: Ambulatory Visit | Attending: Family Medicine | Admitting: Family Medicine

## 2020-06-15 ENCOUNTER — Telehealth: Payer: Self-pay | Admitting: Emergency Medicine

## 2020-06-15 ENCOUNTER — Emergency Department (INDEPENDENT_AMBULATORY_CARE_PROVIDER_SITE_OTHER): Payer: Medicare Other

## 2020-06-15 ENCOUNTER — Other Ambulatory Visit: Payer: Self-pay

## 2020-06-15 VITALS — BP 159/76 | HR 99 | Temp 98.8°F | Resp 16 | Ht 69.0 in | Wt 160.0 lb

## 2020-06-15 DIAGNOSIS — M25551 Pain in right hip: Secondary | ICD-10-CM

## 2020-06-15 DIAGNOSIS — M7061 Trochanteric bursitis, right hip: Secondary | ICD-10-CM

## 2020-06-15 DIAGNOSIS — M166 Other bilateral secondary osteoarthritis of hip: Secondary | ICD-10-CM

## 2020-06-15 IMAGING — DX DG HIP (WITH OR WITHOUT PELVIS) 2-3V*R*
3 series · 3 of 3 positions shown · non-contrast
Comparison: None.

CLINICAL DATA: Right hip pain for 1 week when ambulating.

EXAM:
DG HIP (WITH OR WITHOUT PELVIS) 2-3V RIGHT

[pelvis ap]
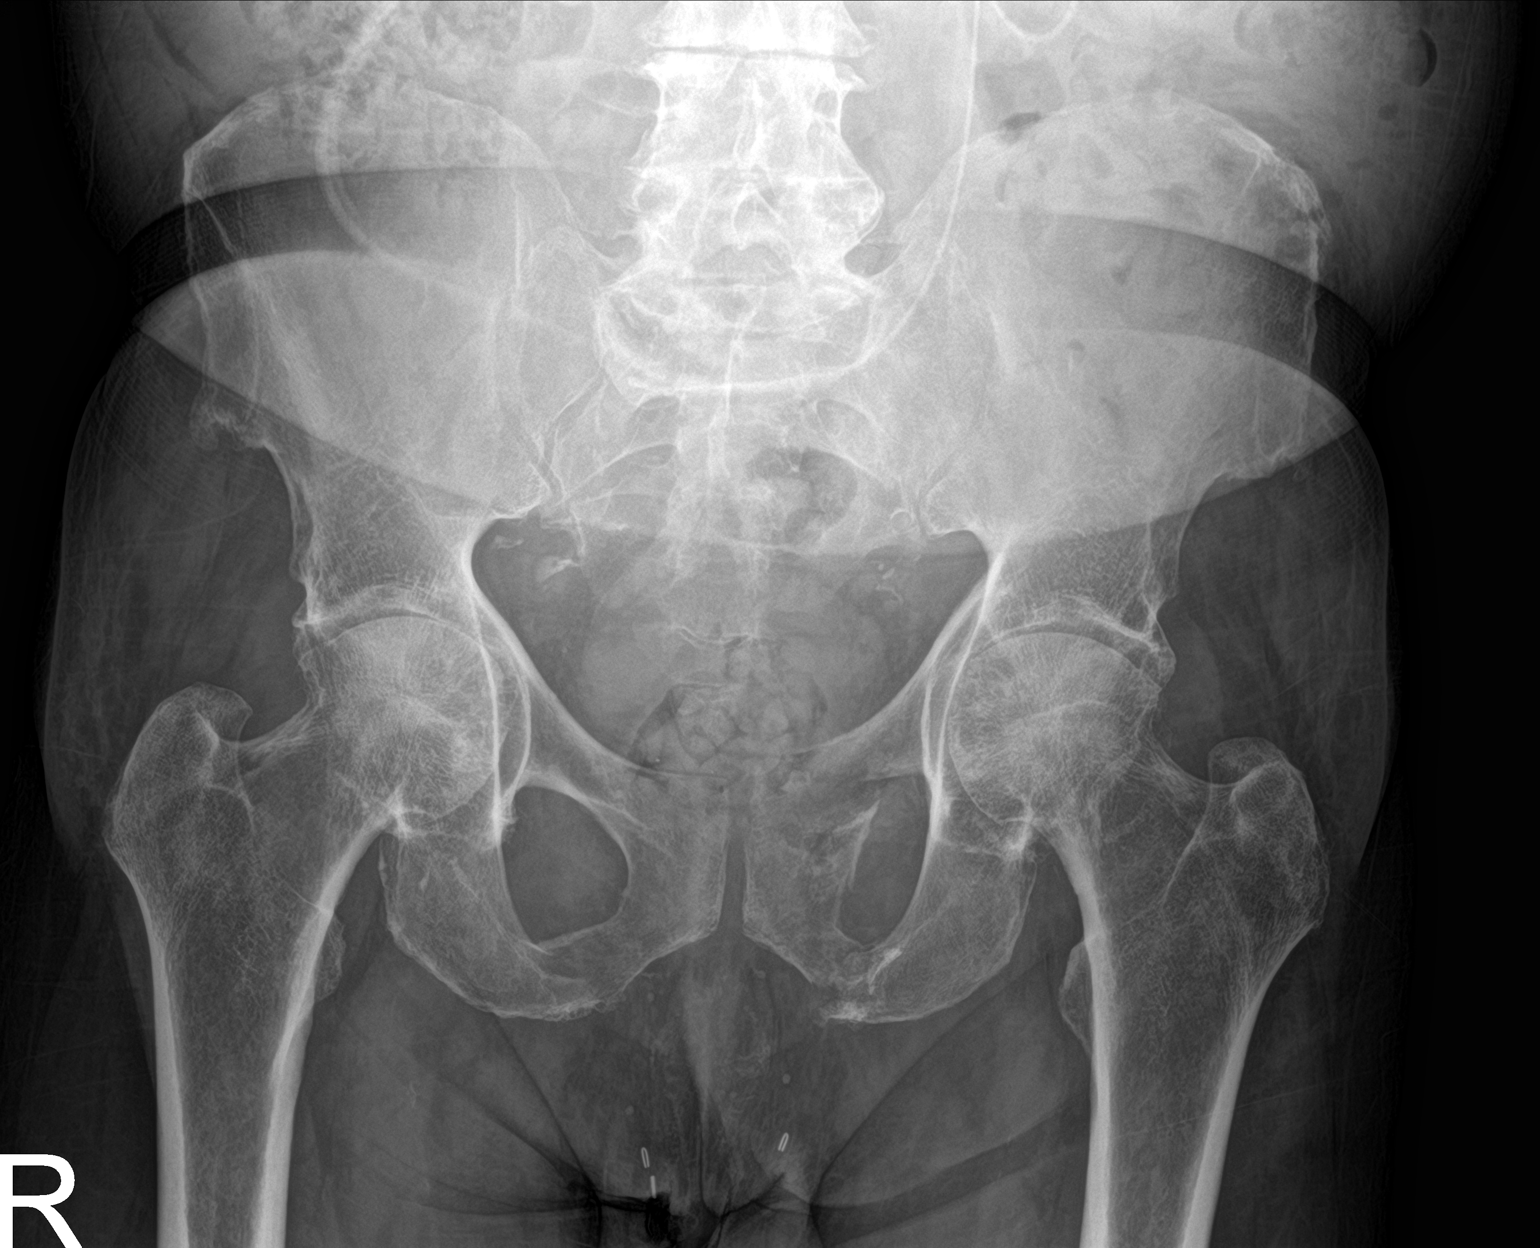

[hip ap]
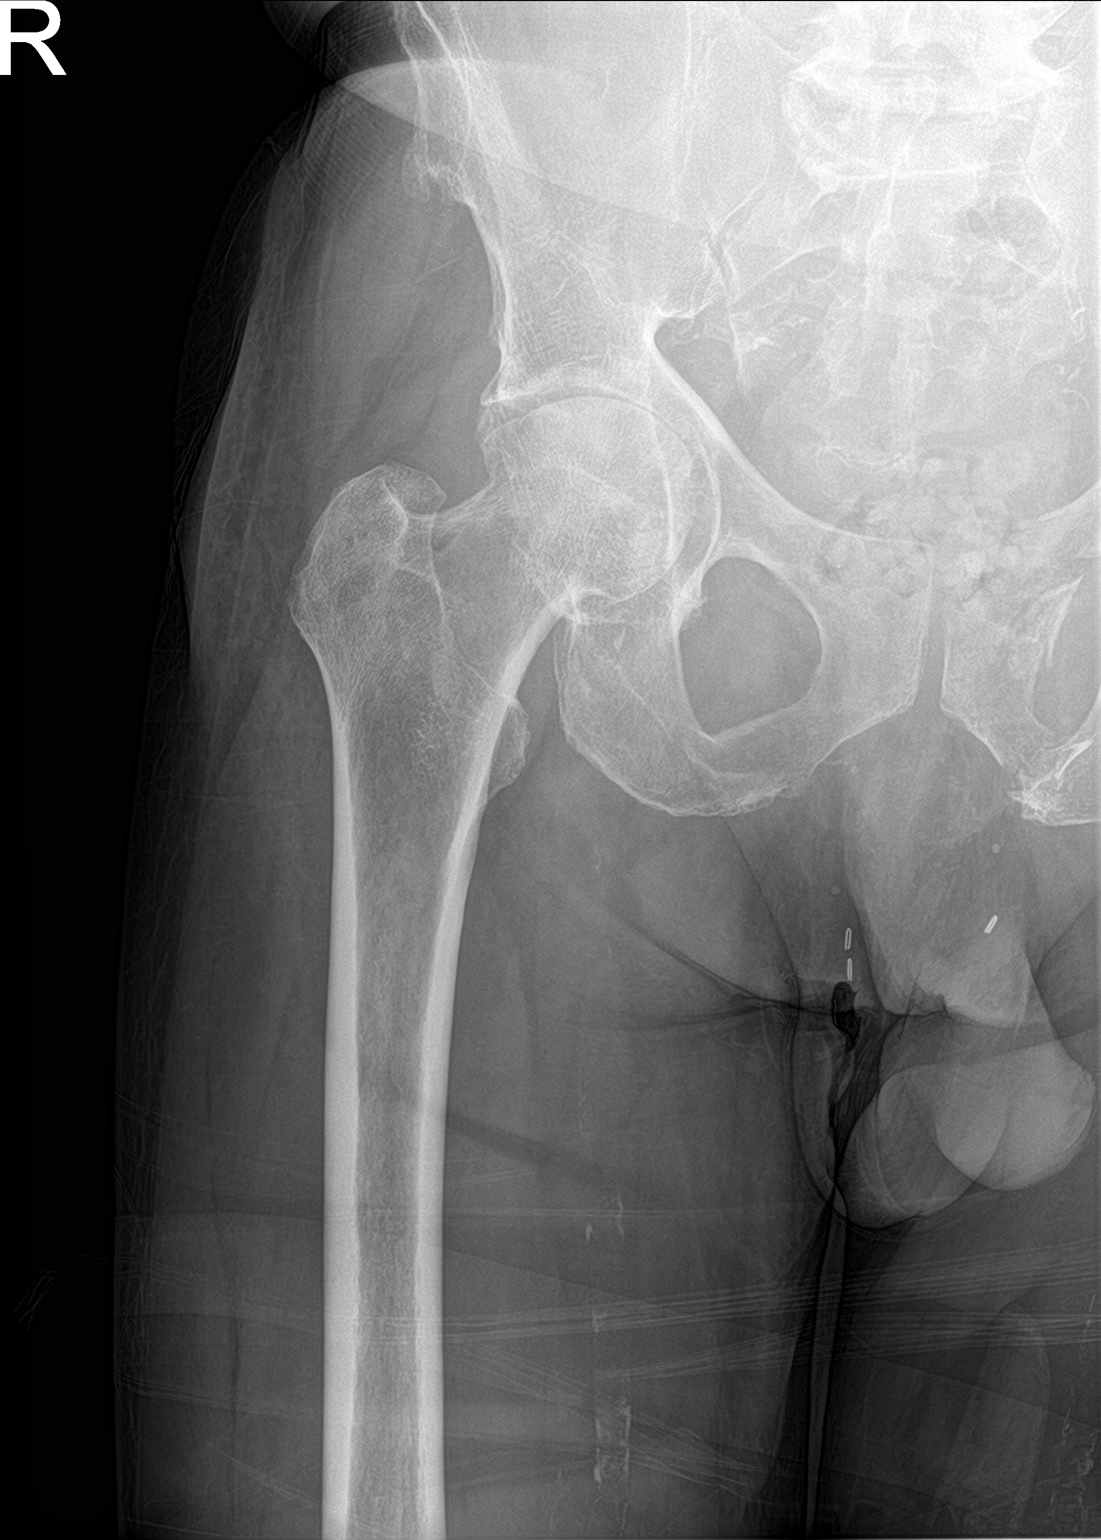

[hip frog leg]
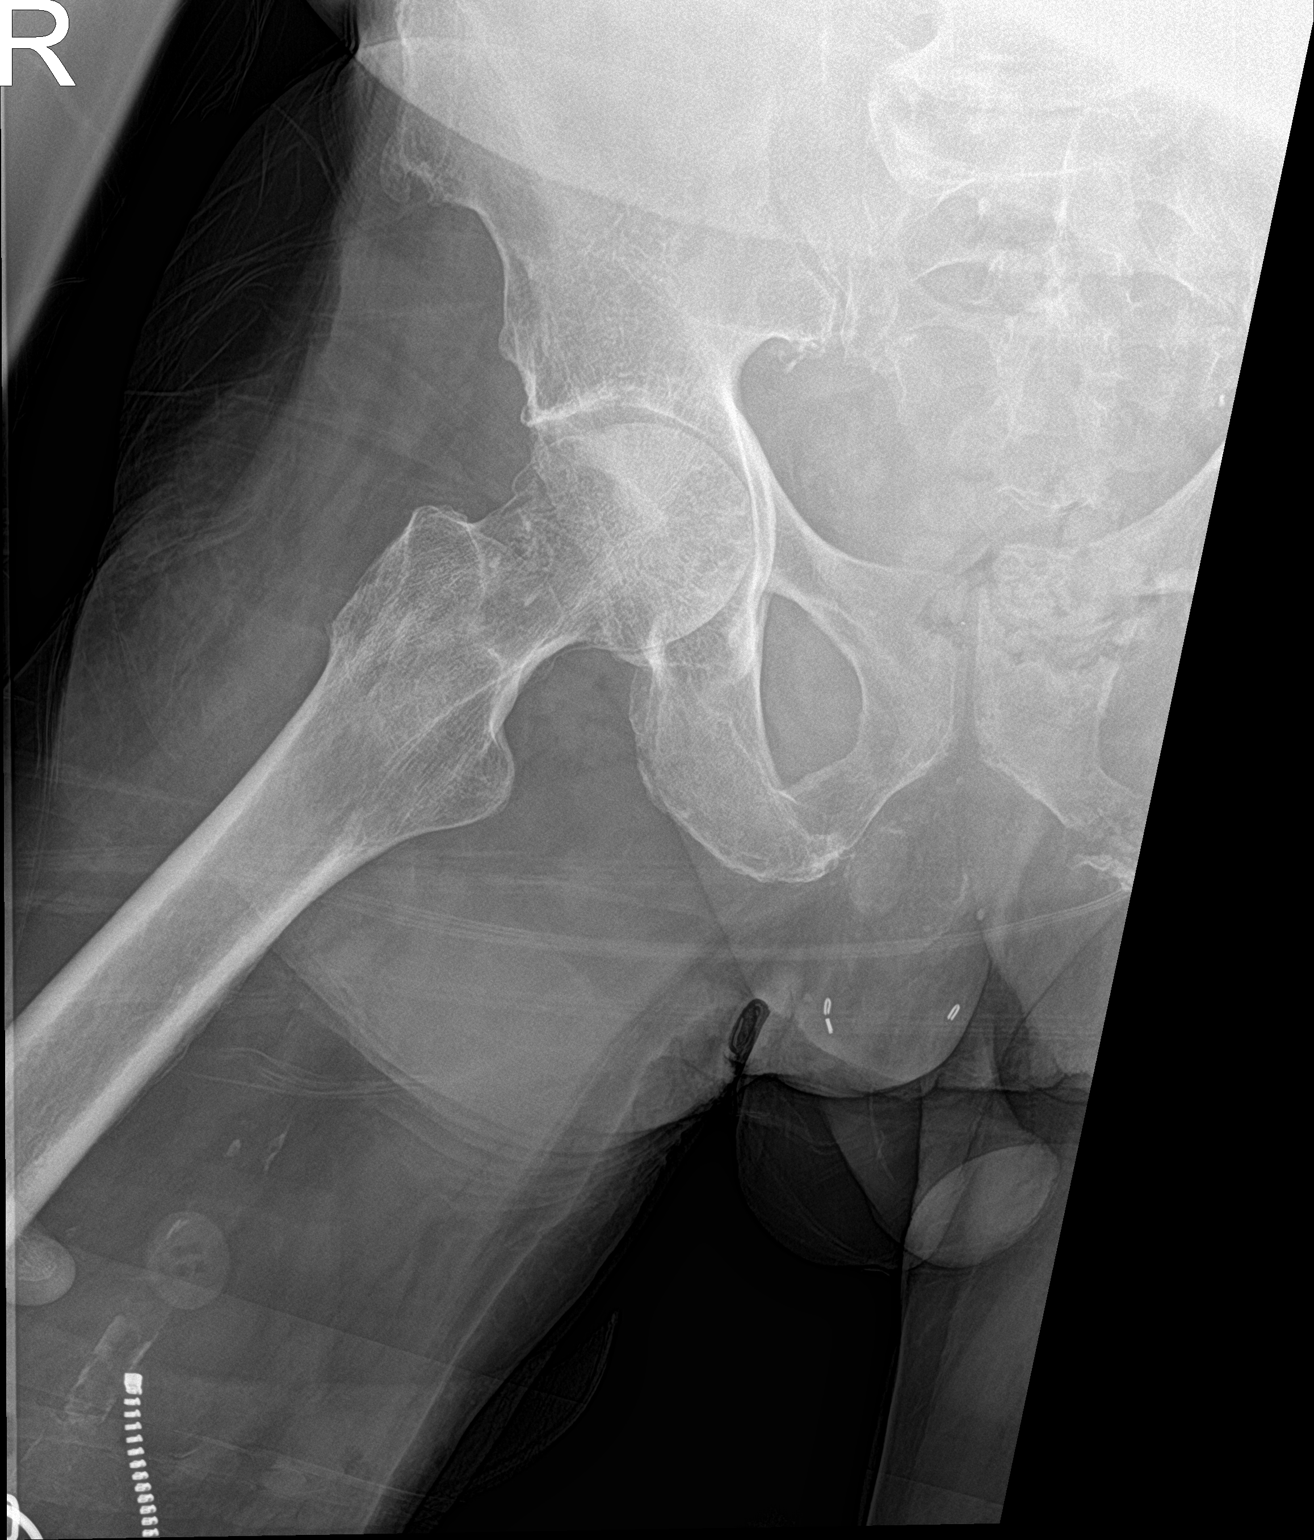

[3 of 3 positions shown; findings below may reference images not displayed]

FINDINGS: Advanced bilateral hip joint space narrowing with near complete
joint space loss centrally. Peripheral acetabular osteophytes.
Lateral femoral head neck osteophytes. Pubic rami and bony pelvis
are intact. No fracture, radiographic evidence of avascular necrosis
or focal bone lesion. There are vascular calcifications.
IMPRESSION: Advanced bilateral hip osteoarthritis. No acute osseous abnormality.

## 2020-06-15 MED ORDER — LIDOCAINE 5 % EX PTCH: MEDICATED_PATCH | CUTANEOUS | 0 refills | Status: DC

## 2020-06-15 MED ORDER — HYDROCODONE-ACETAMINOPHEN 5-325 MG PO TABS: ORAL_TABLET | ORAL | 0 refills | Status: DC

## 2020-06-15 NOTE — ED Provider Notes (Signed)
[facility] C[facility]    CSN: [id number] Arrival date & time: 06/15/20  1522      History   Chief Complaint Chief Complaint  Patient presents with  . Hip Pain    HPI Brian Rose. is a 80 y.o. male.   Patient complains of pain in his right hip for about one week.  The pain is worse when walking.  He recalls no injury to his right hip. Patient had experienced a fracture of the left superior/inferior pubic rami on 04/28/20.  He reports that the pain from that injury has gradually inproved.  The history is provided by the patient.  Hip Pain Episode onset: one week ago. The problem occurs constantly. The problem has not changed since onset.The symptoms are relieved by rest. He has tried nothing for the symptoms.    Past Medical History:  Diagnosis Date  . Chronic kidney disease   . Diabetes ([place])   . Hypertension     Patient Active Problem List   Diagnosis Date Noted  . Pharyngoesophageal dysphagia 05/02/2020  . Septic shock ([place]) 04/19/2020  . Failure to thrive in adult 04/19/2020  . Gram-negative bacteremia 03/29/2020  . 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 Pyelonephritis 03/28/2020  . Severe sepsis ([place]) 03/28/2020  . Feeling sick 03/20/2020  . Hyperkalemia 01/13/2020  . GERD (gastroesophageal reflux disease) 11/16/2019  . Progressive focal motor weakness 08/24/2019  . Trigger thumb, right thumb 03/26/2018  . Bilateral foot pain 12/03/2017  . Elevated LDL cholesterol level 09/25/2017  . Fatigue 09/02/2017  . Secondary hyperparathyroidism of renal origin ([place]) 08/11/2016  . Systolic murmur 63/02/6008  . Lumbar degenerative disc disease 12/05/2015  . Benign essential tremor 09/12/2015  . 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 Anemia of renal disease 09/08/2013  . Pseudogout 09/06/2013  . Left knee pain 09/06/2013  . Diabetes mellitus type 2 in nonobese ([place]) 09/06/2013  . Essential hypertension, benign 09/06/2013  . Annual physical  exam 09/06/2013  . 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 Hypertensive retinopathy 03/30/2012  . Gout 03/30/2012    History reviewed. No pertinent surgical history.     Home Medications    Prior to Admission medications   Medication Sig Start Date End Date Taking? Authorizing Provider  HYDROcodone-acetaminophen (NORCO/VICODIN) 5-325 MG tablet Take one by mouth at bedtime as needed for pain 06/15/20  Yes Kandra Nicolas, MD  lidocaine (LIDODERM) 5 % Apply one patch daily.  Remove & Discard patch after12 hours 06/15/20  Yes Kandra Nicolas, MD  acetaminophen (TYLENOL) 500 MG tablet Take by mouth.    [provider]  allopurinol (ZYLOPRIM) 100 MG tablet Take by mouth. 08/23/19 08/22/20  [provider]  aspirin 81 MG tablet Take 81 mg by mouth 2 (two) times daily.    [provider]  bisacodyl (BISACODYL LAXATIVE) 10 MG suppository Place 1 suppository (10 mg total) rectally as needed for moderate constipation. 06/14/16   Kandra Nicolas, MD  Blood Glucose Monitoring Suppl (BAYER CONTOUR MONITOR) W/DEVICE KIT Use as needed. 11/29/14   Silverio Decamp, MD  Bromfenac Sodium (BROMSITE) 0.075 % SOLN Apply to eye. Patient not taking: Reported on 05/02/2020    [provider]  calcitRIOL (ROCALTROL) 0.25 MCG capsule Take by mouth.    [provider]  carvedilol (COREG) 6.25 MG tablet Take 1 tablet (6.25 mg total) by mouth 2 (two) times daily with a meal. Patient not taking:  Reported on 05/02/2020 02/08/20   Silverio Decamp, MD  chlorhexidine (PERIDEX) 0.12 % solution SMARTSIG:0.5 Ounce(s) By Mouth Twice Daily 02/28/20   [provider]  Cholecalciferol 50 MCG (2000 UT) TABS Take 1 tablet by mouth daily.    [provider]  ciprofloxacin (CIPRO) 500 MG tablet Take by mouth. 04/11/20   [provider]  colchicine 0.6 MG tablet 0.3 mg (1/2 tab) no more than  twice weekly during flares Patient not taking: Reported on 05/02/2020 01/08/18   Silverio Decamp, MD  Cyanocobalamin (B-12) 1000 MCG CAPS Take by mouth.    [provider]  epoetin alfa-epbx (RETACRIT) 78295 UNIT/ML injection Inject into the skin. 04/13/20   [provider]  famotidine (PEPCID) 20 MG tablet Take by mouth. Patient not taking: Reported on 05/02/2020 12/20/18   [provider]  fluconazole (DIFLUCAN) 100 MG tablet Take 100 mg by mouth daily. 04/14/20   [provider]  fluticasone Asencion Islam) 50 MCG/ACT nasal spray One spray in each nostril twice a day Patient not taking: Reported on 05/02/2020 03/20/20   Silverio Decamp, MD  folic acid (FOLVITE) 621 MCG tablet Take by mouth.    [provider]  glucose blood (PRODIGY NO CODING BLOOD GLUC) test strip 2 (two) times daily. 03/16/19   [provider]  lactulose (CHRONULAC) 10 GM/15ML solution Take 15 mLs (10 g total) by mouth 2 (two) times daily. Patient not taking: Reported on 05/02/2020 06/14/16   Kandra Nicolas, MD  melatonin 1 MG TABS tablet Take by mouth. 06/20/19 06/19/20  [provider]  midodrine (PROAMATINE) 2.5 MG tablet Take 1 tablet (2.5 mg total) by mouth 3 (three) times daily with meals. 04/19/20   Silverio Decamp, MD  mirtazapine (REMERON) 15 MG tablet Take 1 tablet (15 mg total) by mouth at bedtime. 04/19/20   Silverio Decamp, MD  MULTIPLE VITAMINS-MINERALS PO Take 1 tablet by mouth every morning. Patient not taking: Reported on 05/02/2020    [provider]  Niacin (VITAMIN B-3 PO) Take by mouth. Patient not taking: Reported on 05/02/2020    [provider]  Omega-3 Fatty Acids (FISH OIL) 1000 MG CAPS Take by mouth.    [provider]  pantoprazole (PROTONIX) 40 MG tablet Take 1 tablet (40 mg total) by mouth 2 (two) times daily before a meal. Patient not taking: Reported on 05/02/2020 03/20/20   Silverio Decamp, MD   pioglitazone (ACTOS) 30 MG tablet Take 1 tablet (30 mg total) by mouth daily. 07/12/19   Silverio Decamp, MD  Prodigy Lancets 28G MISC Use to check blood sugar 2 time(s) daily 10/19/17   [provider]    Family History Family History  Problem Relation Age of Onset  . Diabetes Mother   . Hypertension Mother   . Hypertension Father   . Heart attack Father 8       This is what he died from.  . Diabetes Sister   . Diabetes Brother   . Diabetes Brother   . Diabetes Sister     Social History Social History   Tobacco Use  . Smoking status: Former Smoker    Packs/day: 1.00    Years: 20.00    Pack years: 20.00    Types: Cigars    Quit date: 09/06/1981    Years since quitting: 38.8  . Smokeless tobacco: Never Used  Substance Use Topics  . Alcohol use: No  . Drug use: No  Allergies   Patient has no known allergies.   Review of Systems Review of Systems  Constitutional: Negative for chills, diaphoresis, fatigue, fever and unexpected weight change.  Musculoskeletal: Negative for joint swelling.       Right hip pain  Skin: Negative for rash.     Physical Exam Triage Vital Signs ED Triage Vitals  Enc Vitals Group     BP 06/15/20 1558 (!) 159/76     Pulse Rate 06/15/20 1558 99     Resp 06/15/20 1558 16     Temp 06/15/20 1558 98.8 F (37.1 C)     Temp Source 06/15/20 1558 Oral     SpO2 06/15/20 1558 100 %     Weight 06/15/20 1559 160 lb (72.6 kg)     Height 06/15/20 1559 5\' 9"  (1.753 m)     Head Circumference --      Peak Flow --      Pain Score 06/15/20 1559 8     Pain Loc --      Pain Edu? --      Excl. in GC? --    No data found.  Updated Vital Signs BP (!) 159/76 (BP Location: Right Arm)   Pulse 99   Temp 98.8 F (37.1 C) (Oral)   Resp 16   Ht 5\' 9"  (1.753 m)   Wt 72.6 kg   SpO2 100%   BMI 23.63 kg/m   Visual Acuity Right Eye Distance:   Left Eye Distance:   Bilateral Distance:    Right Eye Near:   Left Eye Near:     Bilateral Near:     Physical Exam Vitals and nursing note reviewed.  Constitutional:      General: He is not in acute distress. HENT:     Head: Normocephalic.  Eyes:     Conjunctiva/sclera: Conjunctivae normal.     Pupils: Pupils are equal, round, and reactive to light.  Cardiovascular:     Rate and Rhythm: Normal rate.     Heart sounds: Normal heart sounds.  Pulmonary:     Effort: Pulmonary effort is normal.     Breath sounds: Normal breath sounds.  Abdominal:     Tenderness: There is no abdominal tenderness.  Musculoskeletal:     Cervical back: Normal range of motion.     Right lower leg: No edema.     Left lower leg: No edema.       Legs:     Comments: Right hip reveals distinct tenderness over the greater trochanter.  Palpating the greater trochanter during resisted lateral abduction of the hip recreates his pain.  Inspection of the lower extremities reveals no leg length discrepancy.  Skin:    General: Skin is warm and dry.     Findings: No rash.  Neurological:     Mental Status: He is alert and oriented to person, place, and time.      UC Treatments / Results  Labs (all labs ordered are listed, but only abnormal results are displayed) Labs Reviewed - No data to display  EKG   Radiology DG Foot Complete Left  Result Date: 06/14/2020 Please see detailed radiograph report in office note.  DG Foot Complete Right  Result Date: 06/14/2020 Please see detailed radiograph report in office note.  DG Hip Unilat W or Wo Pelvis 2-3 Views Right  Result Date: 06/15/2020 CLINICAL DATA:  Right hip pain for 1 week when ambulating. EXAM: DG HIP (WITH OR WITHOUT PELVIS) 2-3V RIGHT COMPARISON:  None. FINDINGS: Advanced bilateral hip joint space narrowing with near complete joint space loss centrally. Peripheral acetabular osteophytes. Lateral femoral head neck osteophytes. Pubic rami and bony pelvis are intact. No fracture, radiographic evidence of avascular necrosis or  focal bone lesion. There are vascular calcifications. IMPRESSION: Advanced bilateral hip osteoarthritis. No acute osseous abnormality. Electronically Signed   By: Keith Rake M.D.   On: 06/15/2020 17:31    Procedures Procedures (including critical care time)  Medications Ordered in UC Medications - No data to display  Initial Impression / Assessment and Plan / UC Course  I have reviewed the triage vital signs and the nursing notes.  Pertinent labs & imaging results that were available during my care of the patient were reviewed by me and considered in my medical decision making (see chart for details).    Onset of right trochanteric bursitis probably the result of prolonged "favoring" of his left lower extremity because of left pelvic fracture Rx for lidocaine patch.  Rx for Vicodin (#5, no refill). Controlled Substance Prescriptions I have consulted the [place] Controlled Substances Registry for this patient, and feel the risk/benefit ratio today is favorable for proceeding with this prescription for a controlled substance.   Followup with orthopedist if not improved two weeks.   Final Clinical Impressions(s) / UC Diagnoses   Final diagnoses:  Trochanteric bursitis, right hip  Other secondary osteoarthritis of both hips     Discharge Instructions     Apply ice pack for 20 to 30 minutes, 3 to 4 times daily  Continue until pain and swelling decrease.  Begin range of motion and stretching exercises as tolerated.    ED Prescriptions    Medication Sig Dispense Auth. Provider   lidocaine (LIDODERM) 5 % Apply one patch daily.  Remove & Discard patch after12 hours 30 patch Kandra Nicolas, MD   HYDROcodone-acetaminophen (NORCO/VICODIN) 5-325 MG tablet Take one by mouth at bedtime as needed for pain 5 tablet Assunta Found Ishmael Holter, MD        Kandra Nicolas, MD 06/17/20 1003

## 2020-06-15 NOTE — Discharge Instructions (Addendum)
Apply ice pack for 20 to 30 minutes, 3 to 4 times daily  Continue until pain and swelling decrease.  Begin range of motion and stretching exercises as tolerated.

## 2020-06-15 NOTE — ED Triage Notes (Signed)
RT hip pain x 1 week

## 2020-06-15 NOTE — Telephone Encounter (Signed)
Call back to [facility] regarding patches lidocaine 5% not covered since the 4% is available OTC - RN updated Dr Assunta Found- pt directed to use OTC patches- pt verbalized an understanding

## 2020-06-20 NOTE — Progress Notes (Signed)
Subjective:   Patient ID: Brian Rose., male   DOB: 80 y.o.   MRN: [id number]   HPI 80 year old male presents the office today for concerns of burning sensations to both of his feet as well as some mild numbness.  He is diabetic his last A1c was 6.4.  He states this started after he had a pelvis fracture is no longer for last 2 to 3 weeks.  He states his symptoms are somewhat improved.  Denies any weakness.  He said no recent treatment for this.  He has no other concerns.   Review of Systems  All other systems reviewed and are negative.  Past Medical History:  Diagnosis Date  . Chronic kidney disease   . Diabetes ([place])   . Hypertension     No past surgical history on file.   Current Outpatient Medications:  .  epoetin alfa-epbx (RETACRIT) 24235 UNIT/ML injection, Inject into the skin., Disp: , Rfl:  .  Prodigy Lancets 28G MISC, Use to check blood sugar 2 time(s) daily, Disp: , Rfl:  .  acetaminophen (TYLENOL) 500 MG tablet, Take by mouth., Disp: , Rfl:  .  allopurinol (ZYLOPRIM) 100 MG tablet, Take by mouth., Disp: , Rfl:  .  aspirin 81 MG tablet, Take 81 mg by mouth 2 (two) times daily., Disp: , Rfl:  .  bisacodyl (BISACODYL LAXATIVE) 10 MG suppository, Place 1 suppository (10 mg total) rectally as needed for moderate constipation., Disp: 12 suppository, Rfl: 0 .  Blood Glucose Monitoring Suppl (BAYER CONTOUR MONITOR) W/DEVICE KIT, Use as needed., Disp: 1 each, Rfl: 0 .  Bromfenac Sodium (BROMSITE) 0.075 % SOLN, Apply to eye. (Patient not taking: Reported on 05/02/2020), Disp: , Rfl:  .  calcitRIOL (ROCALTROL) 0.25 MCG capsule, Take by mouth., Disp: , Rfl:  .  carvedilol (COREG) 6.25 MG tablet, Take 1 tablet (6.25 mg total) by mouth 2 (two) times daily with a meal. (Patient not taking: Reported on 05/02/2020), Disp: 60 tablet, Rfl: 3 .  chlorhexidine (PERIDEX) 0.12 % solution, SMARTSIG:0.5 Ounce(s) By Mouth Twice Daily, Disp: , Rfl:  .  Cholecalciferol 50 MCG (2000 UT) TABS, Take 1  tablet by mouth daily., Disp: , Rfl:  .  ciprofloxacin (CIPRO) 500 MG tablet, Take by mouth., Disp: , Rfl:  .  colchicine 0.6 MG tablet, 0.3 mg (1/2 tab) no more than twice weekly during flares (Patient not taking: Reported on 05/02/2020), Disp: 30 tablet, Rfl: 1 .  Cyanocobalamin (B-12) 1000 MCG CAPS, Take by mouth., Disp: , Rfl:  .  famotidine (PEPCID) 20 MG tablet, Take by mouth. (Patient not taking: Reported on 05/02/2020), Disp: , Rfl:  .  fluconazole (DIFLUCAN) 100 MG tablet, Take 100 mg by mouth daily., Disp: , Rfl:  .  fluticasone (FLONASE) 50 MCG/ACT nasal spray, One spray in each nostril twice a day (Patient not taking: Reported on 05/02/2020), Disp: 48 g, Rfl: 3 .  folic acid (FOLVITE) 361 MCG tablet, Take by mouth., Disp: , Rfl:  .  glucose blood (PRODIGY NO CODING BLOOD GLUC) test strip, 2 (two) times daily., Disp: , Rfl:  .  HYDROcodone-acetaminophen (NORCO/VICODIN) 5-325 MG tablet, Take one by mouth at bedtime as needed for pain, Disp: 5 tablet, Rfl: 0 .  lactulose (CHRONULAC) 10 GM/15ML solution, Take 15 mLs (10 g total) by mouth 2 (two) times daily. (Patient not taking: Reported on 05/02/2020), Disp: 236 mL, Rfl: 0 .  lidocaine (LIDODERM) 5 %, Apply one patch daily.  Remove & Discard patch after12 hours,  Disp: 30 patch, Rfl: 0 .  midodrine (PROAMATINE) 2.5 MG tablet, Take 1 tablet (2.5 mg total) by mouth 3 (three) times daily with meals., Disp: 30 tablet, Rfl: 0 .  mirtazapine (REMERON) 15 MG tablet, Take 1 tablet (15 mg total) by mouth at bedtime., Disp: 30 tablet, Rfl: 3 .  MULTIPLE VITAMINS-MINERALS PO, Take 1 tablet by mouth every morning. (Patient not taking: Reported on 05/02/2020), Disp: , Rfl:  .  Niacin (VITAMIN B-3 PO), Take by mouth. (Patient not taking: Reported on 05/02/2020), Disp: , Rfl:  .  Omega-3 Fatty Acids (FISH OIL) 1000 MG CAPS, Take by mouth., Disp: , Rfl:  .  pantoprazole (PROTONIX) 40 MG tablet, Take 1 tablet (40 mg total) by mouth 2 (two) times daily before a meal.  (Patient not taking: Reported on 05/02/2020), Disp: 60 tablet, Rfl: 3 .  pioglitazone (ACTOS) 30 MG tablet, Take 1 tablet (30 mg total) by mouth daily., Disp: 90 tablet, Rfl: 3  No Known Allergies        Objective:  Physical Exam  General: AAO x3, NAD  Dermatological: Skin is warm, dry and supple bilateral. There are no open sores, no preulcerative lesions, no rash or signs of infection present.  Vascular: Dorsalis Pedis artery and Posterior Tibial artery pedal pulses are 2/4 bilateral with immedate capillary fill time.  There is no pain with calf compression, swelling, warmth, erythema.   Neruologic: Grossly intact via light touch bilateral.  Sensation patient intact with Semmes Weinstein monofilament.  Musculoskeletal: Bunions are present.  No area of pinpoint tenderness identified today.  Muscular strength 5/5 in all groups tested bilateral.  Gait: Unassisted, Nonantalgic.       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  Bunions are evident without any evidence of acute fracture. -Since his diabetes is controlled and only discomfort and diabetes.  It does seem to start after he had a pelvis fracture.  His symptoms have improved somewhat.  We did discuss with him medication options but he wants to hold off on this.  Discussed various topical medications that he can do for his foot as well.  We discussed Aspercreme with lidocaine, Biofreeze.  Trula Slade DPM

## 2020-07-09 ENCOUNTER — Ambulatory Visit (INDEPENDENT_AMBULATORY_CARE_PROVIDER_SITE_OTHER): Payer: Medicare Other | Admitting: Sports Medicine

## 2020-07-09 DIAGNOSIS — Z0000 Encounter for general adult medical examination without abnormal findings: Secondary | ICD-10-CM | POA: Diagnosis not present

## 2020-07-09 NOTE — Progress Notes (Signed)
MEDICARE ANNUAL WELLNESS VISIT  07/09/2020  Telephone Visit Disclaimer This Medicare AWV was conducted by telephone due to national recommendations for restrictions regarding the COVID-19 Pandemic (e.g. social distancing).  I verified, using two identifiers, that I am speaking with Brian Rose. or their authorized healthcare agent. I discussed the limitations, risks, security, and privacy concerns of performing an evaluation and management service by telephone and the potential availability of an in-person appointment in the future. The patient expressed understanding and agreed to proceed.  Location of Patient: Home Location of Provider (nurse):  In the office.  Subjective:    Brian Rose. is a 80 y.o. male patient of Thekkekandam, Gwen Her, MD who had a Medicare Annual Wellness Visit today via telephone. Damein is Retired and lives with their spouse. he has 3 children. he reports that he is socially active and does interact with friends/family regularly. he is minimally physically active and enjoys fishing.  Patient Care Team: Silverio Decamp, MD as PCP - General (Family Medicine)  Advanced Directives 07/09/2020 04/03/2016 12/12/2015  Does Patient Have a Medical Advance Directive? Yes Yes Yes  Type of Advance Directive Living will;Healthcare Power of [place];Living will [place];Living will  Does patient want to make changes to medical advance directive? No - Patient declined No - Patient declined -  Copy of [place] in Chart? No - copy requested No - copy requested No - copy requested    Hospital Utilization Over the Past 12 Months: # of hospitalizations or ER visits: 3 # of surgeries: 0  Review of Systems    Patient reports that his overall health is unchanged compared to last year.  History obtained from chart review and the patient  Patient Reported Readings (BP, Pulse, CBG, Weight,  etc) none  Pain Assessment Pain : 0-10 Pain Score: 8  Pain Type: Chronic pain Pain Location: Hip Pain Orientation: Right Pain Descriptors / Indicators: Aching,Constant,Dull,Discomfort Pain Onset: More than a month ago Pain Frequency: Intermittent Pain Relieving Factors: Tylenol  Pain Relieving Factors: Tylenol  Current Medications & Allergies (verified) Allergies as of 07/09/2020   No Known Allergies     Medication List       Accurate as of Jul 09, 2020  3:31 PM. If you have any questions, ask your nurse or doctor.        acetaminophen 500 MG tablet Commonly known as: TYLENOL Take by mouth.   allopurinol 100 MG tablet Commonly known as: ZYLOPRIM Take by mouth.   aspirin 81 MG tablet Take 81 mg by mouth 2 (two) times daily.   B-12 1000 MCG Caps Take by mouth.   Landscape architect w/Device Kit Use as needed.   bisacodyl 10 MG suppository Commonly known as: Bisacodyl Laxative Place 1 suppository (10 mg total) rectally as needed for moderate constipation.   BromSite 0.075 % Soln Generic drug: Bromfenac Sodium Apply to eye.   calcitRIOL 0.25 MCG capsule Commonly known as: ROCALTROL Take by mouth. 4 days a week.   carvedilol 6.25 MG tablet Commonly known as: COREG Take 1 tablet (6.25 mg total) by mouth 2 (two) times daily with a meal.   chlorhexidine 0.12 % solution Commonly known as: PERIDEX SMARTSIG:0.5 Ounce(s) By Mouth Twice Daily   Cholecalciferol 50 MCG (2000 UT) Tabs Take 1 tablet by mouth daily.   ciprofloxacin 500 MG tablet Commonly known as: CIPRO Take by mouth.   colchicine 0.6 MG tablet 0.3 mg (1/2  tab) no more than twice weekly during flares   epoetin alfa-epbx 10000 UNIT/ML injection Commonly known as: RETACRIT Inject into the skin. Once a month   famotidine 20 MG tablet Commonly known as: PEPCID Take by mouth.   Fish Oil 1000 MG Caps Take by mouth.   fluconazole 100 MG tablet Commonly known as: DIFLUCAN Take 100 mg by  mouth daily.   fluticasone 50 MCG/ACT nasal spray Commonly known as: Flonase One spray in each nostril twice a day   folic acid 321 MCG tablet Commonly known as: FOLVITE Take by mouth.   HYDROcodone-acetaminophen 5-325 MG tablet Commonly known as: NORCO/VICODIN Take one by mouth at bedtime as needed for pain   lactulose 10 GM/15ML solution Commonly known as: CHRONULAC Take 15 mLs (10 g total) by mouth 2 (two) times daily.   lidocaine 5 % Commonly known as: Lidoderm Apply one patch daily.  Remove & Discard patch after12 hours   midodrine 2.5 MG tablet Commonly known as: PROAMATINE Take 1 tablet (2.5 mg total) by mouth 3 (three) times daily with meals.   mirtazapine 15 MG tablet Commonly known as: REMERON Take 1 tablet (15 mg total) by mouth at bedtime.   MULTIPLE VITAMINS-MINERALS PO Take 1 tablet by mouth every morning.   pantoprazole 40 MG tablet Commonly known as: PROTONIX Take 1 tablet (40 mg total) by mouth 2 (two) times daily before a meal.   pioglitazone 30 MG tablet Commonly known as: ACTOS Take 1 tablet (30 mg total) by mouth daily.   Prodigy Lancets 28G Misc Use to check blood sugar 2 time(s) daily   Prodigy No Coding Blood Gluc test strip Generic drug: glucose blood 2 (two) times daily.   VITAMIN B-3 PO Take by mouth.       History (reviewed): Past Medical History:  Diagnosis Date  . Chronic kidney disease   . Diabetes ([place])   . Hypertension    History reviewed. No pertinent surgical history. Family History  Problem Relation Age of Onset  . Diabetes Mother   . Hypertension Mother   . Hypertension Father   . Heart attack Father 74       This is what he died from.  . Diabetes Sister   . Diabetes Brother   . Diabetes Brother   . Diabetes Sister    Social History   Socioeconomic History  . Marital status: Married    Spouse name: Brian Rose  . Number of children: 3  . Years of education: 55  . Highest education level: 12th grade   Occupational History    Comment: Retired  Tobacco Use  . Smoking status: Former Smoker    Packs/day: 1.00    Years: 20.00    Pack years: 20.00    Types: Cigars    Quit date: 09/06/1981    Years since quitting: 38.8  . Smokeless tobacco: Never Used  Substance and Sexual Activity  . Alcohol use: No  . Drug use: No  . Sexual activity: Yes    Partners: Female  Other Topics Concern  . Not on file  Social History Narrative   Lives with his wife. Likes to go fishing when he can.   Social Determinants of Health   Financial Resource Strain: Low Risk   . Difficulty of Paying Living Expenses: Not hard at all  Food Insecurity: No Food Insecurity  . Worried About Charity fundraiser in the Last Year: Never true  . Ran Out of Food in the Last Year:  Never true  Transportation Needs: No Transportation Needs  . Lack of Transportation (Medical): No  . Lack of Transportation (Non-Medical): No  Physical Activity: Inactive  . Days of Exercise per Week: 0 days  . Minutes of Exercise per Session: 0 min  Stress: Stress Concern Present  . Feeling of Stress : To some extent  Social Connections: Moderately Isolated  . Frequency of Communication with Friends and Family: Three times a week  . Frequency of Social Gatherings with Friends and Family: Never  . Attends Religious Services: Never  . Active Member of Clubs or Organizations: No  . Attends Archivist Meetings: Never  . Marital Status: Married    Activities of Daily Living In your present state of health, do you have any difficulty performing the following activities: 07/09/2020  Hearing? N  Vision? N  Difficulty concentrating or making decisions? Y  Comment difficulty concentrating  Walking or climbing stairs? Y  Comment due to the pain in his hip.  Dressing or bathing? N  Doing errands, shopping? N  Preparing Food and eating ? N  Using the Toilet? N  In the past six months, have you accidently leaked urine? N  Do you  have problems with loss of bowel control? N  Managing your Medications? N  Managing your Finances? N  Housekeeping or managing your Housekeeping? Y  Comment not much due to the pain in the right hip  Some recent data might be hidden    Patient Education/ Literacy How often do you need to have someone help you when you read instructions, pamphlets, or other written materials from your doctor or pharmacy?: 1 - Never What is the last grade level you completed in school?: 12th grade  Exercise Current Exercise Habits: Home exercise routine, Type of exercise: stretching, Time (Minutes): 25, Frequency (Times/Week): 4, Weekly Exercise (Minutes/Week): 100, Intensity: Mild, Exercise limited by: orthopedic condition(s)  Diet Patient reports consuming 2 meals a day and 0 snack(s) a day Patient reports that his primary diet is: Regular Patient reports that she does not have regular access to food.   Depression Screen PHQ 2/9 Scores 07/09/2020 09/02/2017 04/21/2017 04/03/2016 12/12/2014  PHQ - 2 Score 2 1 0 0 0  PHQ- 9 Score 5 4 - - -     Fall Risk Fall Risk  07/09/2020 09/23/2018 04/21/2017 04/03/2016 12/12/2014  Falls in the past year? 1 0 No No No  Comment - Emmi Telephone Survey: data to providers prior to load - - -  Number falls in past yr: 0 - - - -  Injury with Fall? 1 - - - -  Risk for fall due to : History of fall(s) - - - -  Follow up Falls evaluation completed;Education provided;Falls prevention discussed - - - -     Objective:  Brian Rose. seemed alert and oriented and he participated appropriately during our telephone visit.  Blood Pressure Weight BMI  BP Readings from Last 3 Encounters:  06/15/20 (!) 159/76  05/02/20 (!) 147/71  04/19/20 (!) 134/58   Wt Readings from Last 3 Encounters:  06/15/20 160 lb (72.6 kg)  04/19/20 160 lb (72.6 kg)  03/20/20 166 lb (75.3 kg)   BMI Readings from Last 1 Encounters:  06/15/20 23.63 kg/m    *Unable to obtain current vital signs,  weight, and BMI due to telephone visit type  Hearing/Vision  . Jasaiah did not seem to have difficulty with hearing/understanding during the telephone conversation . Reports that he has  had a formal eye exam by an eye care professional within the past year . Reports that he has not had a formal hearing evaluation within the past year *Unable to fully assess hearing and vision during telephone visit type  Cognitive Function: 6CIT Screen 07/09/2020  What Year? 0 points  What month? 0 points  What time? 0 points  Count back from 20 0 points  Months in reverse 0 points  Repeat phrase 0 points  Total Score 0   (Normal:0-7, Significant for Dysfunction: >8)  Normal Cognitive Function Screening: Yes   Immunization & Health Maintenance Record Immunization History  Administered Date(s) Administered  . Influenza,inj,Quad PF,6+ Mos 01/02/2015, 12/05/2015  . Influenza-Unspecified 12/02/2017, 11/25/2018, 12/09/2019  . Moderna Sars-Covid-2 Vaccination 04/21/2019, 05/19/2019  . Pneumococcal Conjugate-13 01/02/2015  . Pneumococcal Polysaccharide-23 04/03/2016  . Tdap 01/02/2015  . Zoster 12/05/2015    Health Maintenance  Topic Date Due  . COVID-19 Vaccine (3 - Booster for Moderna series) 10/19/2019  . OPHTHALMOLOGY EXAM  05/25/2020  . HEMOGLOBIN A1C  07/10/2020  . FOOT EXAM  07/11/2020  . INFLUENZA VACCINE  09/24/2020  . TETANUS/TDAP  01/01/2025  . PNA vac Low Risk Adult  Completed  . HPV VACCINES  Aged Out       Assessment  This is a routine wellness examination for [employer]Marland Kitchen  Health Maintenance: Due or Overdue Health Maintenance Due  Topic Date Due  . COVID-19 Vaccine (3 - Booster for Moderna series) 10/19/2019  . OPHTHALMOLOGY EXAM  05/25/2020    Brian Rose. does not need a referral for Community Assistance: Care Management:   no Social Work:    no Prescription Assistance:  no Nutrition/Diabetes Education:  no   Plan:  Personalized Goals Goals Addressed               This Visit's Progress   .  Patient Stated (pt-stated)        07/09/2020 AWV Goal: Fall Prevention  . Over the next year, patient will decrease their risk for falls by: o Using assistive devices, such as a cane or walker, as needed o Identifying fall risks within their home and correcting them by: - Removing throw rugs - Adding handrails to stairs or ramps - Removing clutter and keeping a clear pathway throughout the home - Increasing light, especially at night - Adding shower handles/bars - Raising toilet seat o Identifying potential personal risk factors for falls: - Medication side effects - Incontinence/urgency - Vestibular dysfunction - Hearing loss - Musculoskeletal disorders - Neurological disorders - Orthostatic hypotension   07/09/2020 AWV Goal: Exercise for General Health   Patient will verbalize understanding of the benefits of increased physical activity:  Exercising regularly is important. It will improve your overall fitness, flexibility, and endurance.  Regular exercise also will improve your overall health. It can help you control your weight, reduce stress, and improve your bone density.  Over the next year, patient will increase physical activity as tolerated with a goal of at least 150 minutes of moderate physical activity per week.   You can tell that you are exercising at a moderate intensity if your heart starts beating faster and you start breathing faster but can still hold a conversation.  Moderate-intensity exercise ideas include:  Walking 1 mile (1.6 km) in about 15 minutes  Biking  Hiking  Golfing  Dancing  Water aerobics  Patient will verbalize understanding of everyday activities that increase physical activity by providing examples like the following: ? Brian  work, such as: ? Pushing a Conservation officer, nature ? Raking and bagging leaves ? Washing your car ? Pushing a stroller ? Shoveling snow ? Gardening ? Washing windows or  floors  Patient will be able to explain general safety guidelines for exercising:   Before you start a new exercise program, talk with your health care provider.  Do not exercise so much that you hurt yourself, feel dizzy, or get very short of breath.  Wear comfortable clothes and wear shoes with good support.  Drink plenty of water while you exercise to prevent dehydration or heat stroke.  Work out until your breathing and your heartbeat get faster.       Personalized Health Maintenance & Screening Recommendations  Shingrix vaccine and covid booster   Patient declined these vaccines at this time.  Lung Cancer Screening Recommended: no (Low Dose CT Chest recommended if Age 48-80 years, 30 pack-year currently smoking OR have quit w/in past 15 years) Hepatitis C Screening recommended: no HIV Screening recommended: no  Advanced Directives: Written information was not prepared per patient's request.  Referrals & Orders No orders of the defined types were placed in this encounter.   Follow-up Plan . Follow-up with Silverio Decamp, MD as planned . Medicare wellness in one year.   I have personally reviewed and noted the following in the patient's chart:   . Medical and social history . Use of alcohol, tobacco or illicit drugs  . Current medications and supplements . Functional ability and status . Nutritional status . Physical activity . Advanced directives . List of other physicians . Hospitalizations, surgeries, and ER visits in previous 12 months . Vitals . Screenings to include cognitive, depression, and falls . Referrals and appointments  In addition, I have reviewed and discussed with Brian Rose. certain preventive protocols, quality metrics, and best practice recommendations. A written personalized care plan for preventive services as well as general preventive health recommendations is available and can be mailed to the patient at his request.       Tinnie Gens, RN  07/09/2020

## 2020-07-09 NOTE — Patient Instructions (Addendum)
[place] Maintenance Summary and Written Plan of Care  [name] ,  Thank you for allowing me to perform your Medicare Annual Wellness Visit and for your ongoing commitment to your health.   Health Maintenance & Immunization History Health Maintenance  Topic Date Due  . COVID-19 Vaccine (3 - Booster for Moderna series) 07/25/2020 (Originally 10/19/2019)  . HEMOGLOBIN A1C  07/10/2020  . FOOT EXAM  07/11/2020  . INFLUENZA VACCINE  09/24/2020  . OPHTHALMOLOGY EXAM  05/31/2021  . TETANUS/TDAP  01/01/2025  . PNA vac Low Risk Adult  Completed  . HPV VACCINES  Aged Out   Immunization History  Administered Date(s) Administered  . Influenza,inj,Quad PF,6+ Mos 01/02/2015, 12/05/2015  . Influenza-Unspecified 12/02/2017, 11/25/2018, 12/09/2019  . Moderna Sars-Covid-2 Vaccination 04/21/2019, 05/19/2019  . Pneumococcal Conjugate-13 01/02/2015  . Pneumococcal Polysaccharide-23 04/03/2016  . Tdap 01/02/2015  . Zoster 12/05/2015    These are the patient goals that we discussed: Goals Addressed              This Visit's Progress   .  Patient Stated (pt-stated)        07/09/2020 AWV Goal: Fall Prevention  . Over the next year, patient will decrease their risk for falls by: o Using assistive devices, such as a cane or walker, as needed o Identifying fall risks within their home and correcting them by: - Removing throw rugs - Adding handrails to stairs or ramps - Removing clutter and keeping a clear pathway throughout the home - Increasing light, especially at night - Adding shower handles/bars - Raising toilet seat o Identifying potential personal risk factors for falls: - Medication side effects - Incontinence/urgency - Vestibular dysfunction - Hearing loss - Musculoskeletal disorders - Neurological disorders - Orthostatic hypotension   07/09/2020 AWV Goal: Exercise for General Health   Patient will verbalize understanding of the benefits of  increased physical activity:  Exercising regularly is important. It will improve your overall fitness, flexibility, and endurance.  Regular exercise also will improve your overall health. It can help you control your weight, reduce stress, and improve your bone density.  Over the next year, patient will increase physical activity as tolerated with a goal of at least 150 minutes of moderate physical activity per week.   You can tell that you are exercising at a moderate intensity if your heart starts beating faster and you start breathing faster but can still hold a conversation.  Moderate-intensity exercise ideas include:  Walking 1 mile (1.6 km) in about 15 minutes  Biking  Hiking  Golfing  Dancing  Water aerobics  Patient will verbalize understanding of everyday activities that increase physical activity by providing examples like the following: ? Yard work, such as: ? Pushing a Conservation officer, nature ? Raking and bagging leaves ? Washing your car ? Pushing a stroller ? Shoveling snow ? Gardening ? Washing windows or floors  Patient will be able to explain general safety guidelines for exercising:   Before you start a new exercise program, talk with your health care provider.  Do not exercise so much that you hurt yourself, feel dizzy, or get very short of breath.  Wear comfortable clothes and wear shoes with good support.  Drink plenty of water while you exercise to prevent dehydration or heat stroke.  Work out until your breathing and your heartbeat get faster.         This is a list of Health Maintenance Items that are overdue or due now: Shingrix  vaccine and covid booster   Orders/Referrals Placed Today: No orders of the defined types were placed in this encounter.  (Contact our referral department at [phone] if you have not spoken with someone about your referral appointment within the next 5 days)    Follow-up Plan . Follow-up with Silverio Decamp,  MD as planned . Medicare wellness in one year.       Health Maintenance, Male Adopting a healthy lifestyle and getting preventive care are important in promoting health and wellness. Ask your health care provider about:  The right schedule for you to have regular tests and exams.  Things you can do on your own to prevent diseases and keep yourself healthy. What should I know about diet, weight, and exercise? Eat a healthy diet  Eat a diet that includes plenty of vegetables, fruits, low-fat dairy products, and lean protein.  Do not eat a lot of foods that are high in solid fats, added sugars, or sodium.   Maintain a healthy weight Body mass index (BMI) is a measurement that can be used to identify possible weight problems. It estimates body fat based on height and weight. Your health care provider can help determine your BMI and help you achieve or maintain a healthy weight. Get regular exercise Get regular exercise. This is one of the most important things you can do for your health. Most adults should:  Exercise for at least 150 minutes each week. The exercise should increase your heart rate and make you sweat (moderate-intensity exercise).  Do strengthening exercises at least twice a week. This is in addition to the moderate-intensity exercise.  Spend less time sitting. Even light physical activity can be beneficial. Watch cholesterol and blood lipids Have your blood tested for lipids and cholesterol at 81 years of age, then have this test every 5 years. You may need to have your cholesterol levels checked more often if:  Your lipid or cholesterol levels are high.  You are older than 81 years of age.  You are at high risk for heart disease. What should I know about cancer screening? Many types of cancers can be detected early and may often be prevented. Depending on your health history and family history, you may need to have cancer screening at various ages. This may  include screening for:  Colorectal cancer.  Prostate cancer.  Skin cancer.  Lung cancer. What should I know about heart disease, diabetes, and high blood pressure? Blood pressure and heart disease  High blood pressure causes heart disease and increases the risk of stroke. This is more likely to develop in people who have high blood pressure readings, are of African descent, or are overweight.  Talk with your health care provider about your target blood pressure readings.  Have your blood pressure checked: ? Every 3-5 years if you are 48-43 years of age. ? Every year if you are 35 years old or older.  If you are between the ages of 18 and 5 and are a current or former smoker, ask your health care provider if you should have a one-time screening for abdominal aortic aneurysm (AAA). Diabetes Have regular diabetes screenings. This checks your fasting blood sugar level. Have the screening done:  Once every three years after age 76 if you are at a normal weight and have a low risk for diabetes.  More often and at a younger age if you are overweight or have a high risk for diabetes. What should I know about preventing  infection? Hepatitis B If you have a higher risk for hepatitis B, you should be screened for this virus. Talk with your health care provider to find out if you are at risk for hepatitis B infection. Hepatitis C Blood testing is recommended for:  Everyone born from [birth year] through 1966.  Anyone with known risk factors for hepatitis C. Sexually transmitted infections (STIs)  You should be screened each year for STIs, including gonorrhea and chlamydia, if: ? You are sexually active and are younger than 81 years of age. ? You are older than 81 years of age and your health care provider tells you that you are at risk for this type of infection. ? Your sexual activity has changed since you were last screened, and you are at increased risk for chlamydia or gonorrhea. Ask your  health care provider if you are at risk.  Ask your health care provider about whether you are at high risk for HIV. Your health care provider may recommend a prescription medicine to help prevent HIV infection. If you choose to take medicine to prevent HIV, you should first get tested for HIV. You should then be tested every 3 months for as long as you are taking the medicine. Follow these instructions at home: Lifestyle  Do not use any products that contain nicotine or tobacco, such as cigarettes, e-cigarettes, and chewing tobacco. If you need help quitting, ask your health care provider.  Do not use street drugs.  Do not share needles.  Ask your health care provider for help if you need support or information about quitting drugs. Alcohol use  Do not drink alcohol if your health care provider tells you not to drink.  If you drink alcohol: ? Limit how much you have to 0-2 drinks a day. ? Be aware of how much alcohol is in your drink. In the [place] one drink equals one 12 oz bottle of beer (355 mL), one 5 oz glass of wine (148 mL), or one 1 oz glass of hard liquor (44 mL). General instructions  Schedule regular health, dental, and eye exams.  Stay current with your vaccines.  Tell your health care provider if: ? You often feel depressed. ? You have ever been abused or do not feel safe at home. Summary  Adopting a healthy lifestyle and getting preventive care are important in promoting health and wellness.  Follow your health care provider's instructions about healthy diet, exercising, and getting tested or screened for diseases.  Follow your health care provider's instructions on monitoring your cholesterol and blood pressure. This information is not intended to replace advice given to you by your health care provider. Make sure you discuss any questions you have with your health care provider. Document Revised: 02/03/2018 Document Reviewed: 02/03/2018 Elsevier Patient Education   2021 [employer].

## 2020-07-16 ENCOUNTER — Telehealth: Payer: Self-pay | Admitting: General Practice

## 2020-07-16 NOTE — Telephone Encounter (Signed)
Transition Care Management Follow-up Telephone Call  Date of discharge and from where: 07/15/20 [facility]  How have you been since you were released from the hospital? Doing better.  Any questions or concerns? No  Items Reviewed:  Did the pt receive and understand the discharge instructions provided? Yes   Medications obtained and verified? Yes   Other? No   Any new allergies since your discharge? No   Dietary orders reviewed? Yes  Do you have support at home? Yes   Home Care and Equipment/Supplies: Were home health services ordered? no   Functional Questionnaire: (I = Independent and D = Dependent) ADLs: I  Bathing/Dressing- I  Meal Prep- I  Eating- I  Maintaining continence- I  Transferring/Ambulation- I  Managing Meds- I  Follow up appointments reviewed:   PCP Hospital f/u appt confirmed? Yes  Scheduled to see Dr. Darene Lamer on 07/17/20 @ 1045.  [facility] f/u appt confirmed? No    Are transportation arrangements needed? No   If their condition worsens, is the pt aware to call PCP or go to the Emergency Dept.? Yes  Was the patient provided with contact information for the PCP's office or ED? Yes  Was to pt encouraged to call back with questions or concerns? Yes

## 2020-07-17 ENCOUNTER — Ambulatory Visit (INDEPENDENT_AMBULATORY_CARE_PROVIDER_SITE_OTHER): Payer: Medicare Other | Admitting: Sports Medicine

## 2020-07-17 ENCOUNTER — Ambulatory Visit (INDEPENDENT_AMBULATORY_CARE_PROVIDER_SITE_OTHER): Payer: Medicare Other

## 2020-07-17 ENCOUNTER — Other Ambulatory Visit: Payer: Self-pay

## 2020-07-17 ENCOUNTER — Encounter: Payer: Self-pay | Admitting: Sports Medicine

## 2020-07-17 DIAGNOSIS — M545 Low back pain, unspecified: Secondary | ICD-10-CM | POA: Diagnosis not present

## 2020-07-17 DIAGNOSIS — M25551 Pain in right hip: Secondary | ICD-10-CM

## 2020-07-17 IMAGING — DX DG LUMBAR SPINE COMPLETE 4+V
5 series · 5 of 5 positions shown · non-contrast
Comparison: Lumbar spine radiograph dated [DATE].

CLINICAL DATA: 79-year-old male with low back pain.

EXAM:
LUMBAR SPINE - COMPLETE 4+ VIEW

[l-spine ap]
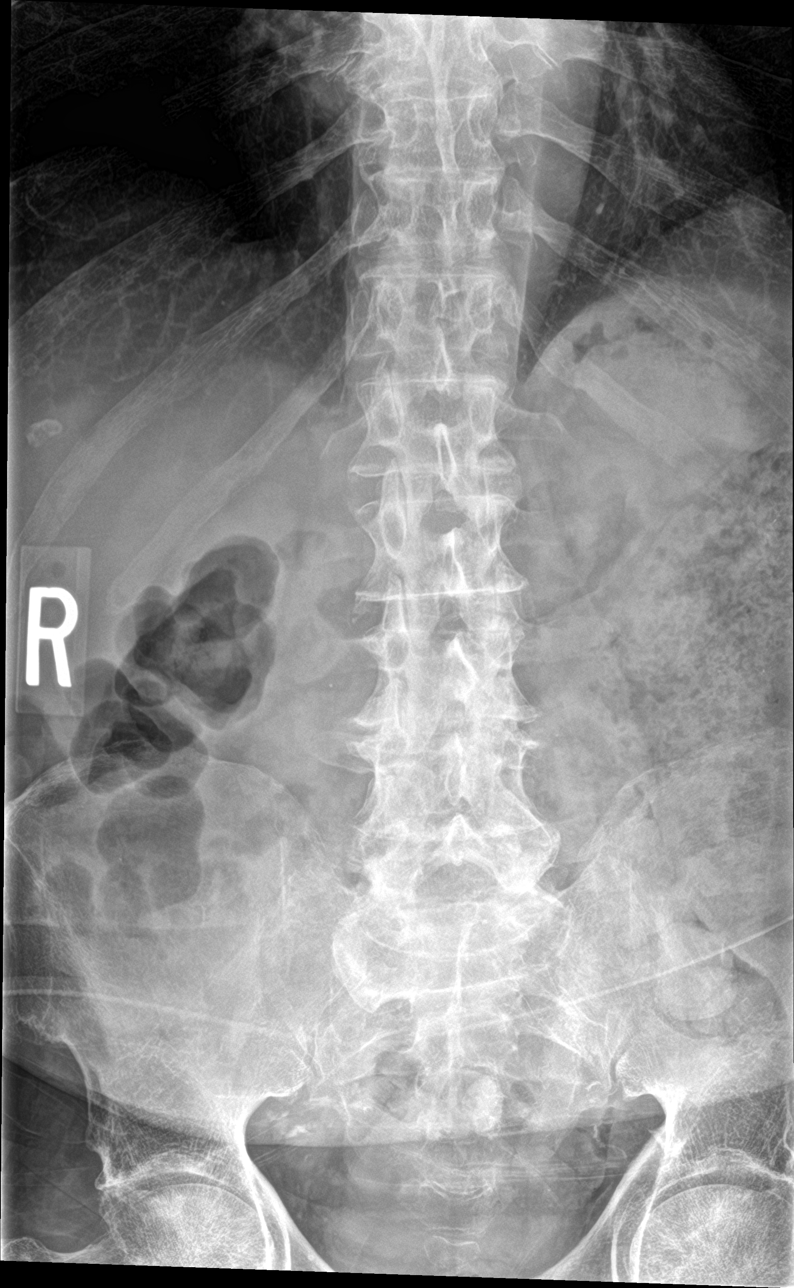

[l-spine obl (1 of 2)]
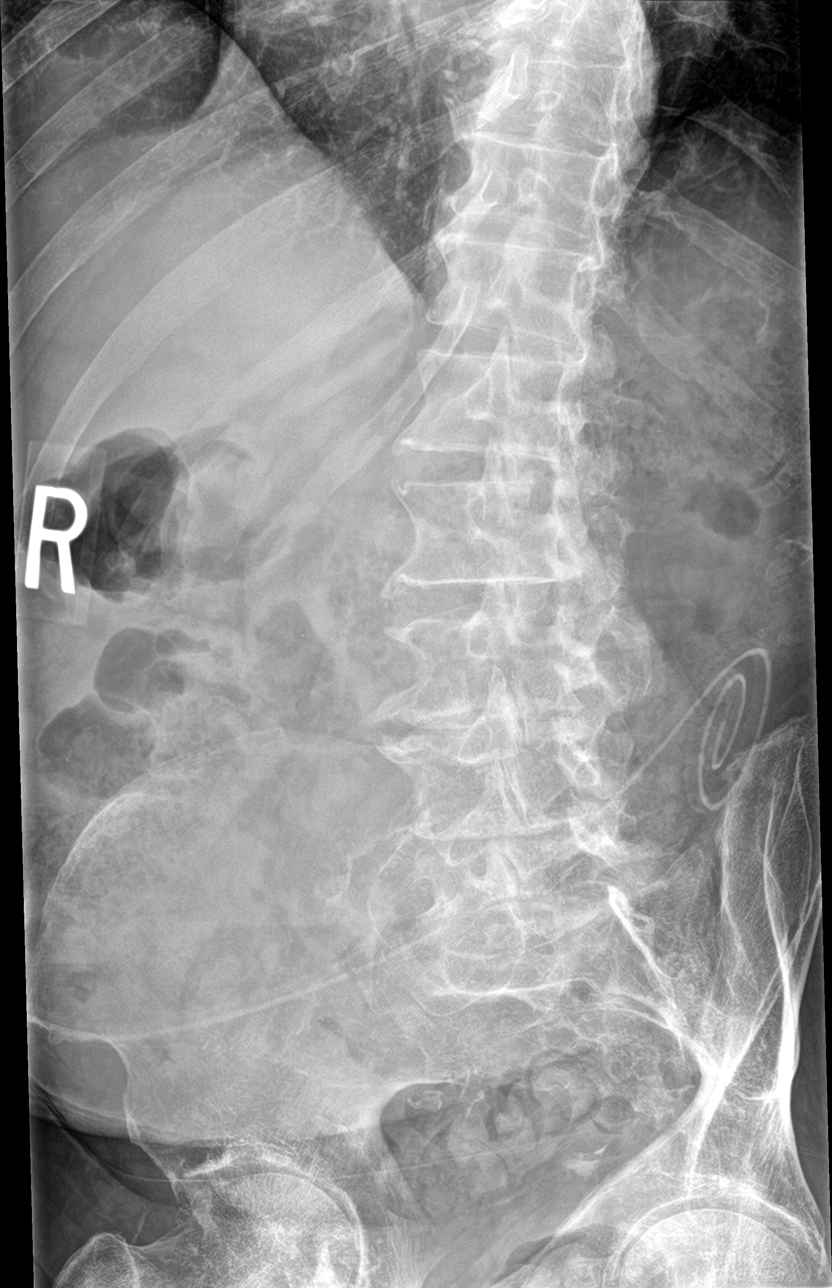

[l-spine obl (2 of 2)]
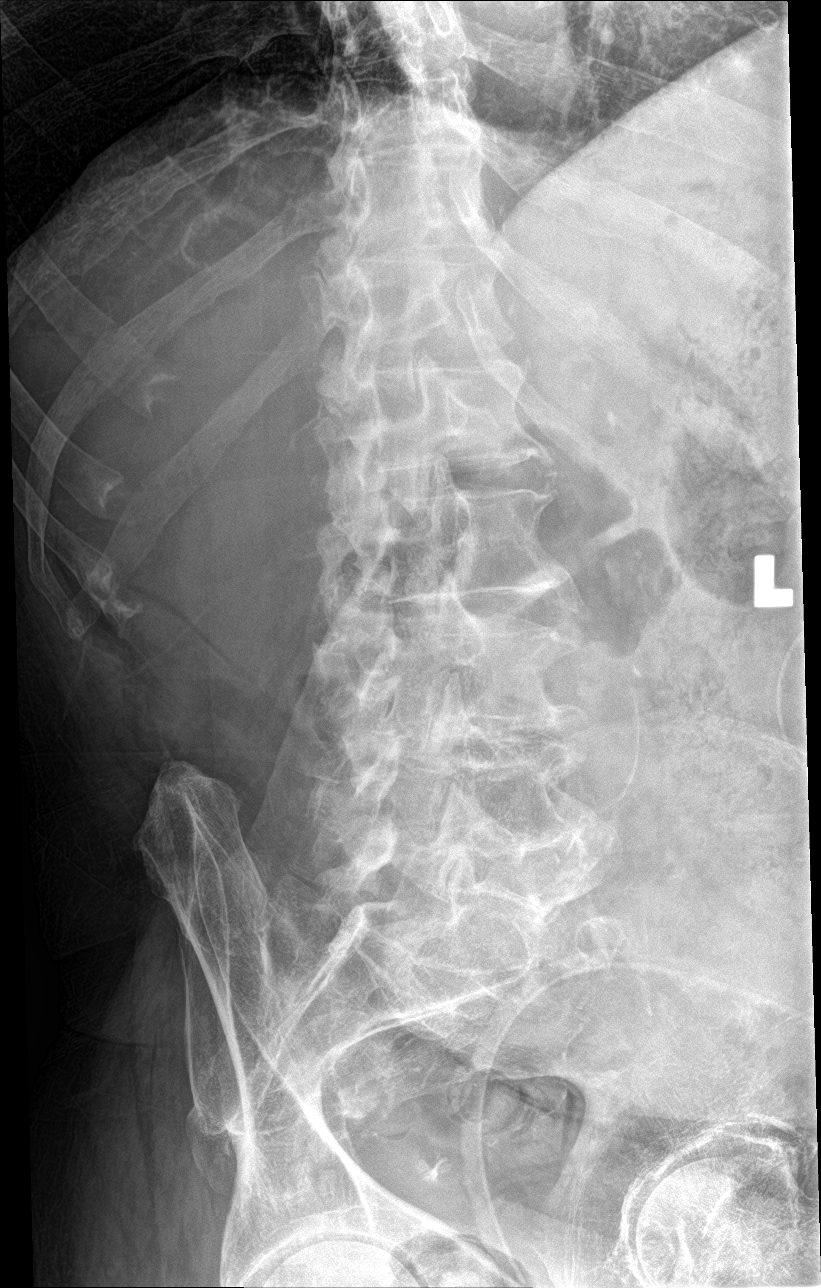

[l-spine lat]
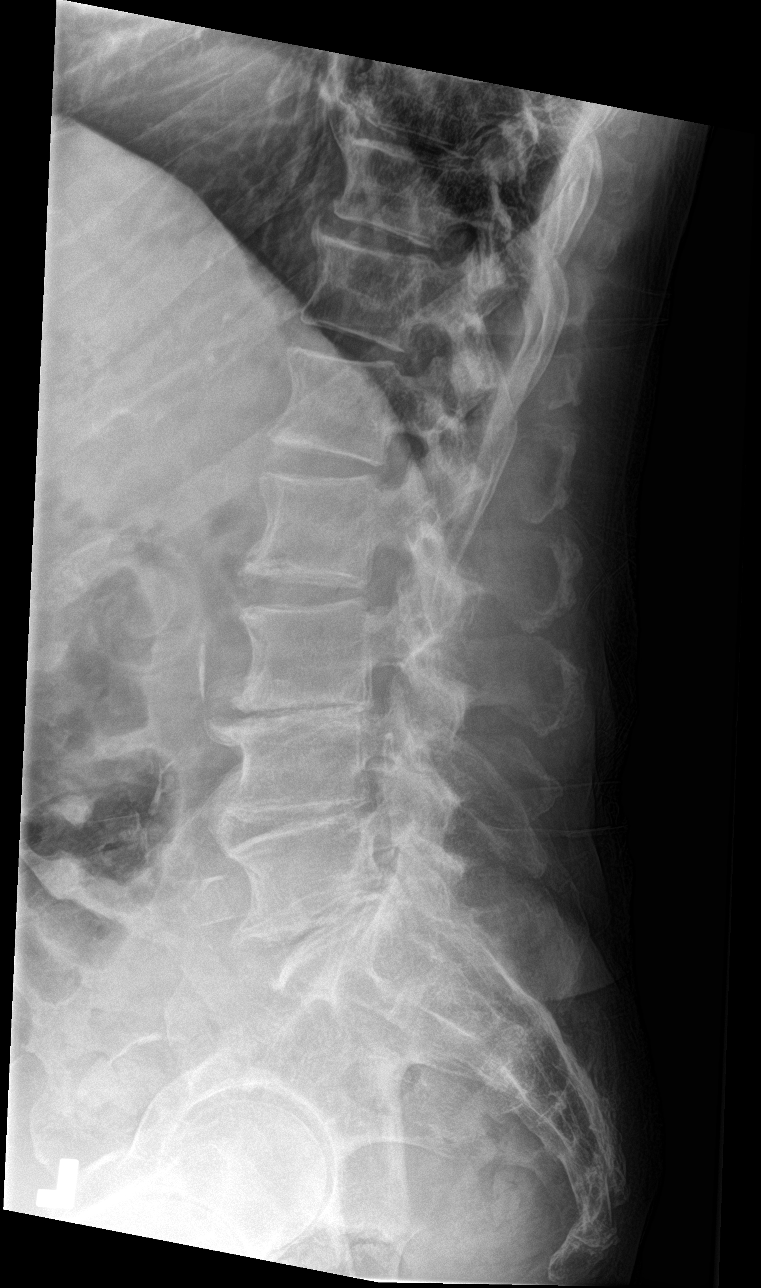

[l-spine spot]
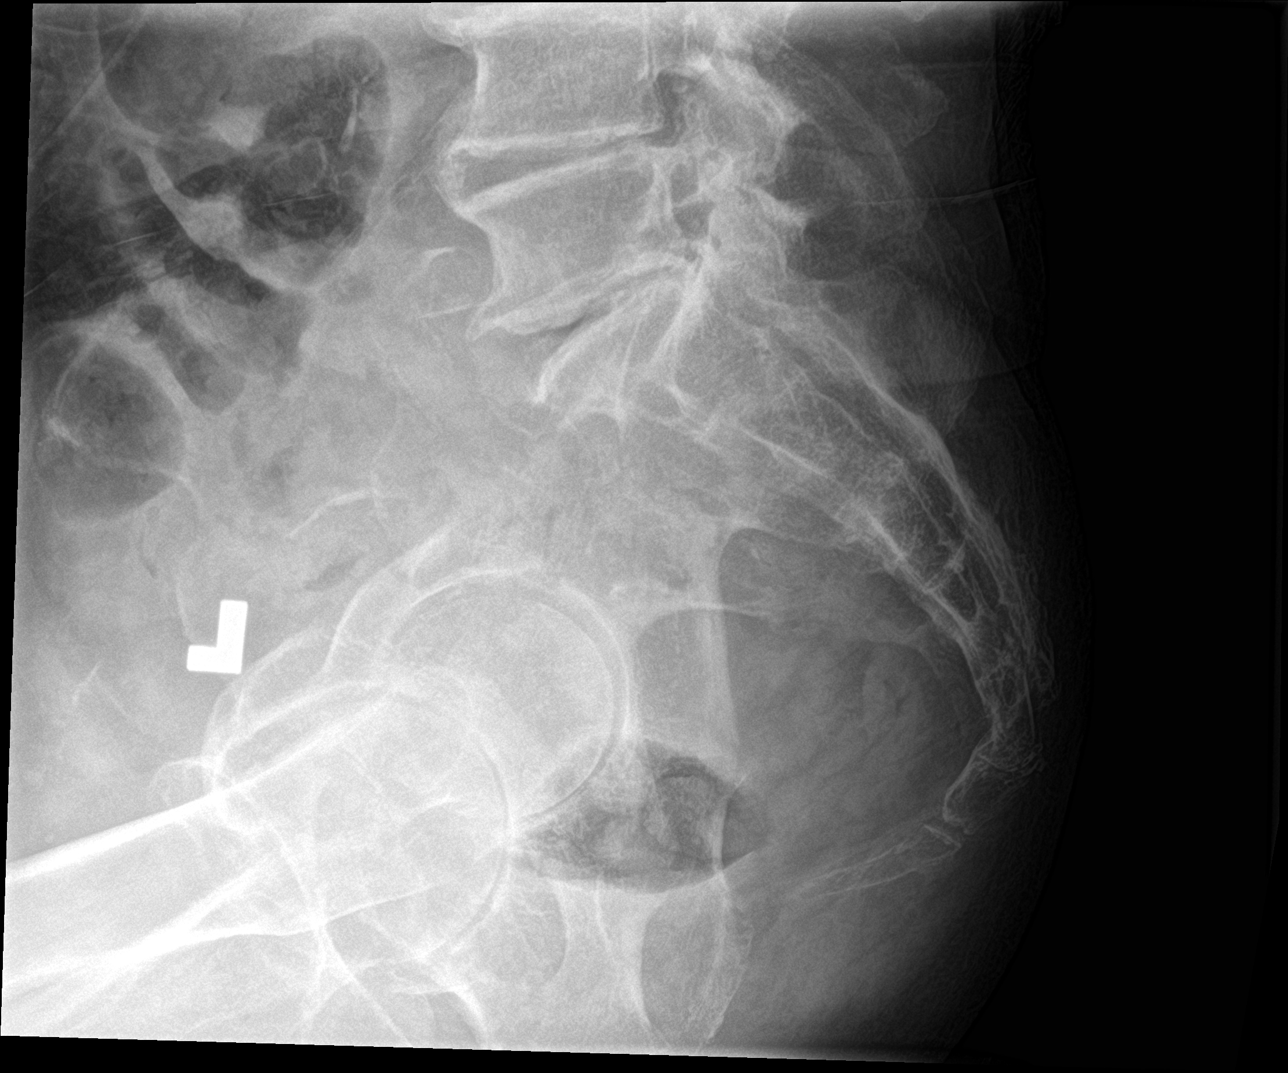

[5 of 5 positions shown; findings below may reference images not displayed]

FINDINGS: Five lumbar type vertebra. There is no acute fracture or subluxation
of the lumbar spine. There are multilevel degenerative changes with
disc space narrowing and spurring most prominent at L3-L4, L4-L5,
and L5-S1. Lower lumbar facet arthropathy. There is mild
atherosclerotic calcification of the abdominal aorta. An
indeterminate 1 cm calcific focus in the right upper quadrant.
IMPRESSION: 1. No acute fracture or subluxation of the lumbar spine.
2. Multilevel degenerative changes of the lumbar spine.

## 2020-07-17 MED ORDER — TRAMADOL HCL 50 MG PO TABS: 50.0000 mg | ORAL_TABLET | Freq: Three times a day (TID) | ORAL | 0 refills | Status: DC | PRN

## 2020-07-17 NOTE — Assessment & Plan Note (Addendum)
Ancle has had a rough couple of months, he had a fall back in March, he was unable to walk afterwards, he was seen in the ED where x-rays showed pubic ramus comminuted fracture. Initially he was in a wheelchair followed by a walker and symptoms seem to resolve. More recently he has had increasing pain in his right hip, x-rays have shown osteoarthritis, he did have a hip joint injection with Dr. Lovett Calender at [facility] orthopedics and sports medicine without any improvement. She suspected a lumbar source of pain and referred him to spine surgery. He does endorse the pain is somewhat on the posterior lateral aspect of his hip and buttock. 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 Adding some tramadol, I would like updated lumbar spine x-rays. Return to see me in a month, if insufficient improvement we will probably investigate his lumbar spine with her further with an MRI.  This is a chronic process with exacerbation and pharmacologic management.

## 2020-07-17 NOTE — Progress Notes (Signed)
    Procedures performed today:    None.  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  Brief History, Exam, Impression, and Recommendations:    Right hip pain Brian Rose has had a rough couple of months, he had a fall back in March, he was unable to walk afterwards, he was seen in the ED where x-rays showed pubic ramus comminuted fracture. Initially he was in a wheelchair followed by a walker and symptoms seem to resolve. More recently he has had increasing pain in his right hip, x-rays have shown osteoarthritis, he did have a hip joint injection with Dr. Lovett Calender at [facility] orthopedics and sports medicine without any improvement. She suspected a lumbar source of pain and referred him to spine surgery. He does endorse the pain is somewhat on the posterior lateral aspect of his hip and buttock. 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 Adding some tramadol, I would like updated lumbar spine x-rays. Return to see me in a month, if insufficient improvement we will probably investigate his lumbar spine with her further with an MRI.  This is a chronic process with exacerbation and pharmacologic management.    ___________________________________________ Gwen Her. Dianah Field, M.D., ABFM., CAQSM. Primary Care and [place] Instructor of [place] [employer] [facility] [employer]

## 2020-07-19 ENCOUNTER — Telehealth: Payer: Self-pay

## 2020-07-19 DIAGNOSIS — M51369 Other intervertebral disc degeneration, lumbar region without mention of lumbar back pain or lower extremity pain: Secondary | ICD-10-CM

## 2020-07-19 DIAGNOSIS — M5136 Other intervertebral disc degeneration, lumbar region: Secondary | ICD-10-CM

## 2020-07-19 NOTE — Telephone Encounter (Signed)
I am glad he brought this up, formal physical therapy would be crucially beneficial here.  I am going to go ahead and place a referral, if he is unable to do formal physical therapy I can send off some home conditioning exercises.

## 2020-07-19 NOTE — Telephone Encounter (Signed)
Patient wanted to know if there were any exercises, etc that he could be doing to help with the discomfort. Please advise.   (Patient does have mychart access to send information.)

## 2020-07-19 NOTE — Telephone Encounter (Signed)
Patient stated that he has some people coming to his house working with him some. He will see if they can help him with his back.

## 2020-07-27 ENCOUNTER — Other Ambulatory Visit: Payer: Self-pay | Admitting: Sports Medicine

## 2020-07-27 DIAGNOSIS — E119 Type 2 diabetes mellitus without complications: Secondary | ICD-10-CM

## 2020-07-27 MED ORDER — PIOGLITAZONE HCL 30 MG PO TABS: 30.0000 mg | ORAL_TABLET | Freq: Every day | ORAL | 3 refills | Status: DC

## 2020-07-31 ENCOUNTER — Ambulatory Visit (INDEPENDENT_AMBULATORY_CARE_PROVIDER_SITE_OTHER): Payer: Medicare Other | Admitting: Sports Medicine

## 2020-07-31 ENCOUNTER — Other Ambulatory Visit: Payer: Self-pay

## 2020-07-31 ENCOUNTER — Encounter: Payer: Self-pay | Admitting: Sports Medicine

## 2020-07-31 VITALS — BP 148/73 | HR 84 | Ht 69.0 in | Wt 155.0 lb

## 2020-07-31 DIAGNOSIS — E119 Type 2 diabetes mellitus without complications: Secondary | ICD-10-CM | POA: Diagnosis not present

## 2020-07-31 DIAGNOSIS — M5136 Other intervertebral disc degeneration, lumbar region: Secondary | ICD-10-CM | POA: Diagnosis not present

## 2020-07-31 DIAGNOSIS — M51369 Other intervertebral disc degeneration, lumbar region without mention of lumbar back pain or lower extremity pain: Secondary | ICD-10-CM

## 2020-07-31 DIAGNOSIS — M4807 Spinal stenosis, lumbosacral region: Secondary | ICD-10-CM | POA: Diagnosis not present

## 2020-07-31 DIAGNOSIS — N139 Obstructive and reflux uropathy, unspecified: Secondary | ICD-10-CM | POA: Diagnosis not present

## 2020-07-31 NOTE — Progress Notes (Addendum)
    Procedures performed today:    None.  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  Brief History, Exam, Impression, and Recommendations:    Lumbar degenerative disc disease This is a pleasant 80 year old male, he continues to have right-sided axial low back pain, worse with flexion. No progressive weakness, bowel or bladder dysfunction, saddle numbness. He has been having symptoms since March, he did have a pubic ramus fracture from a fall, but his pelvic pain has since resolved. He has been getting conservative treatment including formal physical therapy at home for greater than 6 weeks. For this reason we will proceed with an MRI for interventional planning, pain is predominantly axial but occasionally will radiate down the posterior lateral aspect of his right leg. Return to see me for MRI results.  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  Ideal if they are fasting.    ___________________________________________ Gwen Her. Dianah Field, M.D., ABFM., CAQSM. Primary Care and [place] Instructor of [employer]

## 2020-07-31 NOTE — Assessment & Plan Note (Addendum)
This is a pleasant 80 year old male, he continues to have right-sided axial low back pain, worse with flexion. No progressive weakness, bowel or bladder dysfunction, saddle numbness. He has been having symptoms since March, he did have a pubic ramus fracture from a fall, but his pelvic pain has since resolved. He has been getting conservative treatment including formal physical therapy at home for greater than 6 weeks. For this reason we will proceed with an MRI for interventional planning, pain is predominantly axial but occasionally will radiate down the posterior lateral aspect of his right leg. Return to see me for MRI results.  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  Ideal if they are fasting.

## 2020-08-05 ENCOUNTER — Other Ambulatory Visit: Payer: Self-pay

## 2020-08-05 ENCOUNTER — Ambulatory Visit (INDEPENDENT_AMBULATORY_CARE_PROVIDER_SITE_OTHER): Payer: Medicare Other

## 2020-08-05 DIAGNOSIS — M5136 Other intervertebral disc degeneration, lumbar region: Secondary | ICD-10-CM | POA: Diagnosis not present

## 2020-08-05 DIAGNOSIS — M4807 Spinal stenosis, lumbosacral region: Secondary | ICD-10-CM

## 2020-08-05 IMAGING — MR MR LUMBAR SPINE W/O CM
4 of 5 series · 24 of 48 positions shown · non-contrast
Comparison: Lumbar radiographs [DATE].

CLINICAL DATA: Spinal stenosis. Right-sided S1 radiculopathy. Low
back pain and right hip pain.

EXAM:
MRI LUMBAR SPINE WITHOUT CONTRAST
TECHNIQUE: Multiplanar, multisequence MR imaging of the lumbar spine was
performed. No intravenous contrast was administered.

[Series 2: T2 · sagittal · 4.0mm · 0.81mm/px · 6 of 14 slices shown (1 of 2)]
[im 1/14]
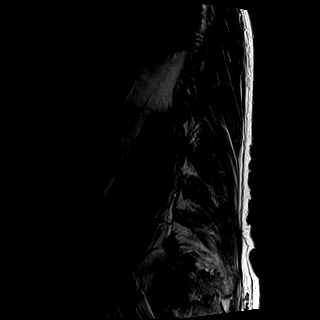
[im 3/14]
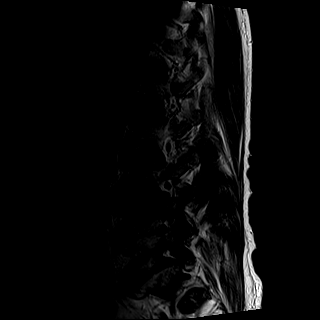
[im 6/14]
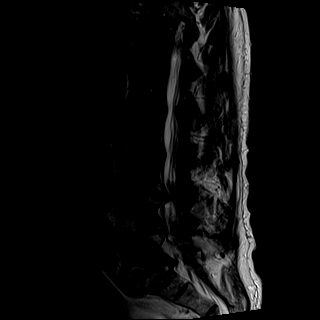
[im 8/14]
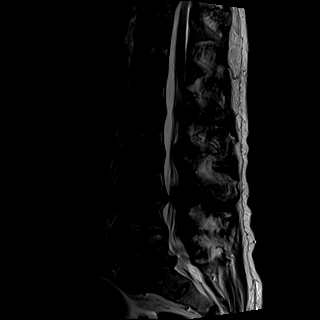
[im 11/14]
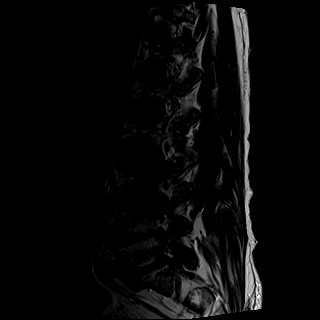
[im 14/14]
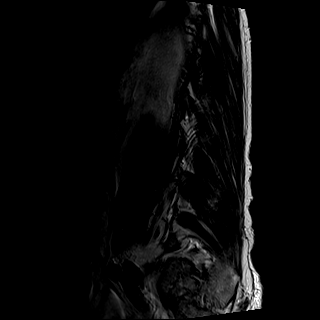

[Series 3: T1 · sagittal · 4.0mm · 0.41mm/px · 6 of 15 slices shown (1 of 2)]
[im 1/15]
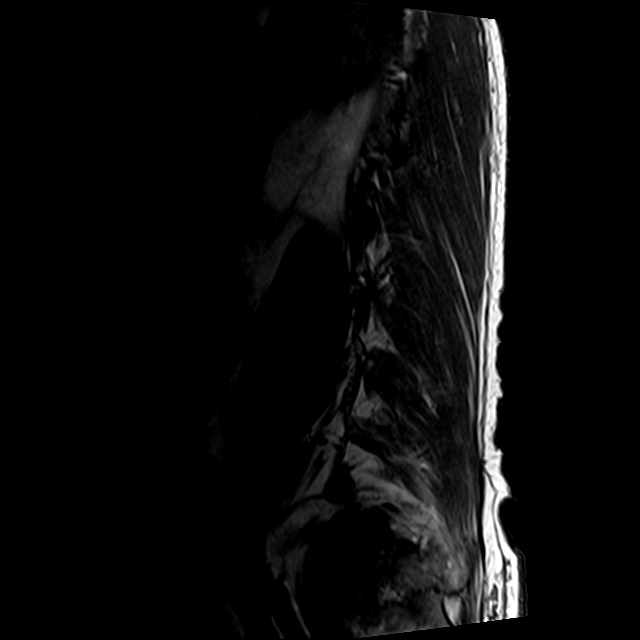
[im 3/15]
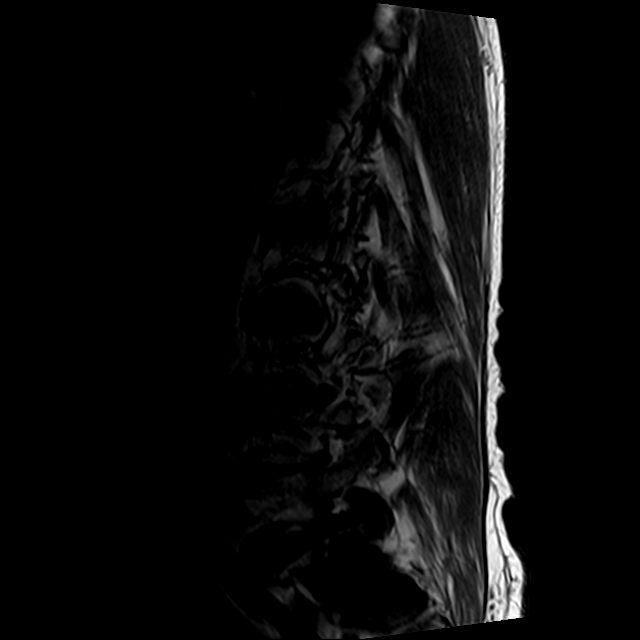
[im 6/15]
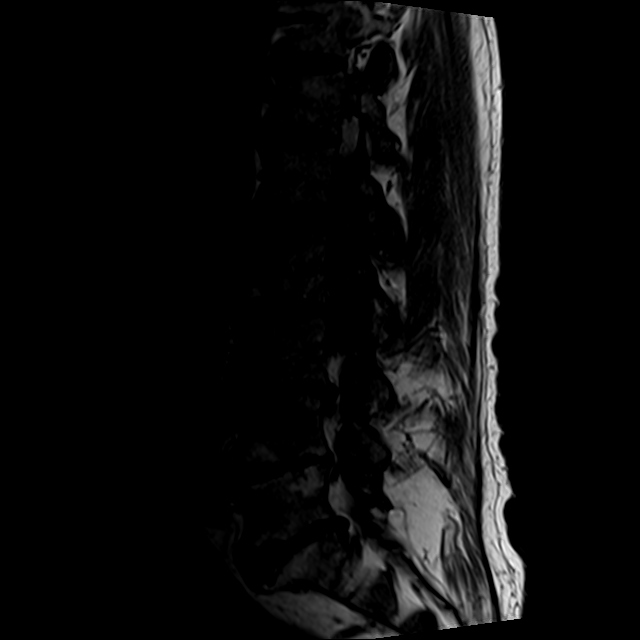
[im 9/15]
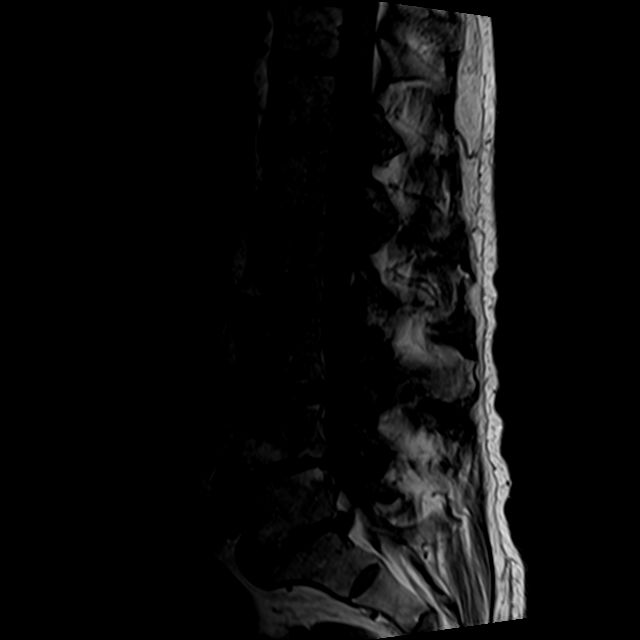
[im 12/15]
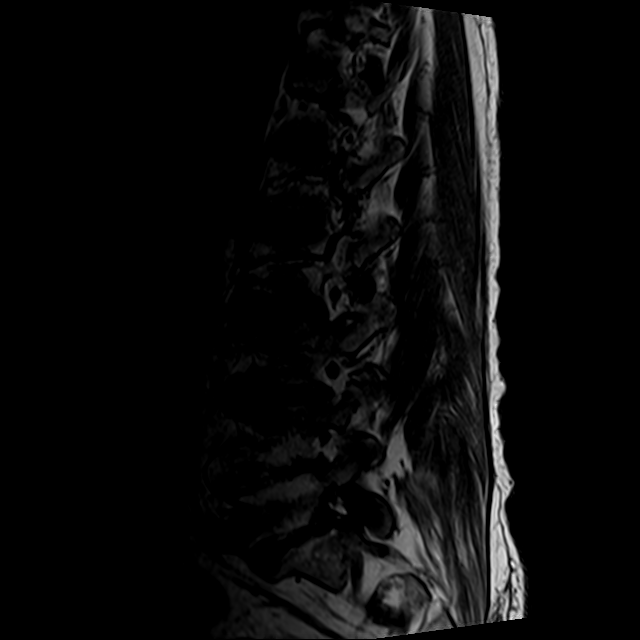
[im 15/15]
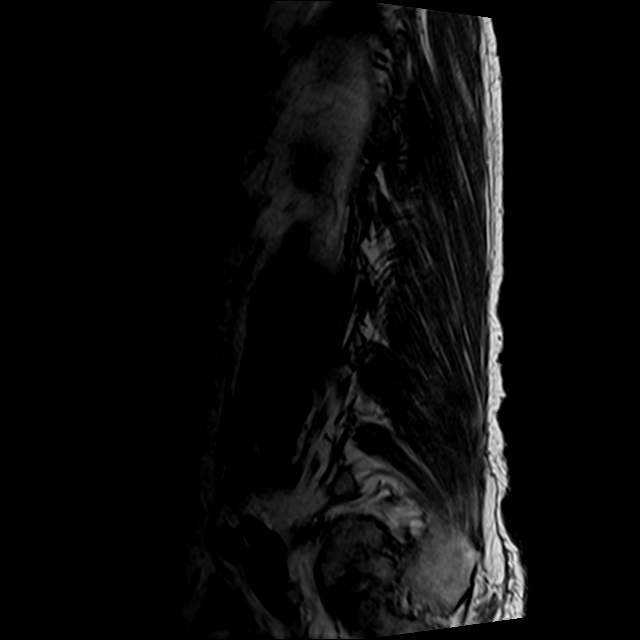

[Series 5: T2 · axial · 4.0mm · 0.78mm/px · z∈[-87,+118]mm · 9 of 39 slices shown (2 of 2)]
[im 1/39]
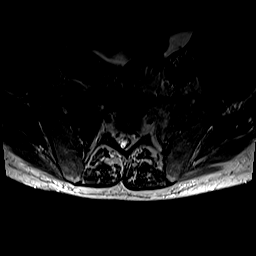
[im 6/39]
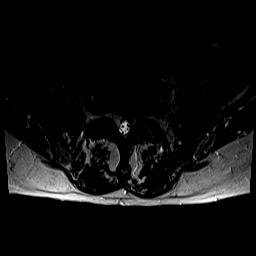
[im 11/39]
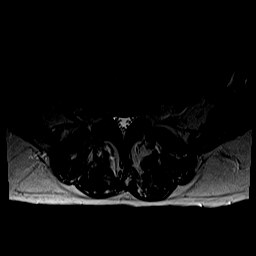
[im 17/39]
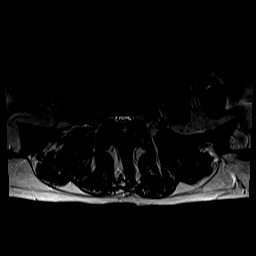
[im 20/39]
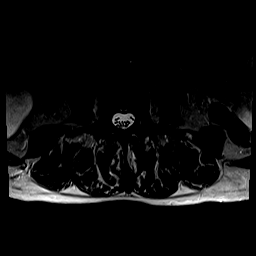
[im 22/39]
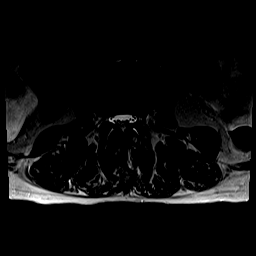
[im 28/39]
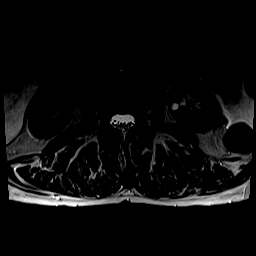
[im 33/39]
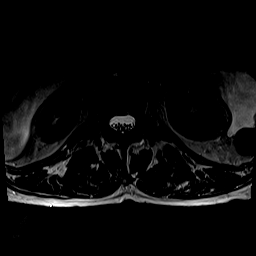
[im 39/39]
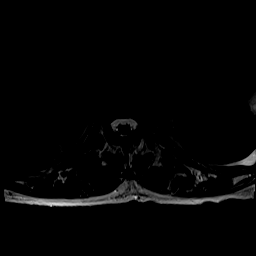

[Series 6: T1 · axial · 4.0mm · 0.39mm/px · z∈[-62,+88]mm · 3 of 39 slices shown (2 of 2)]
[im 6/39]
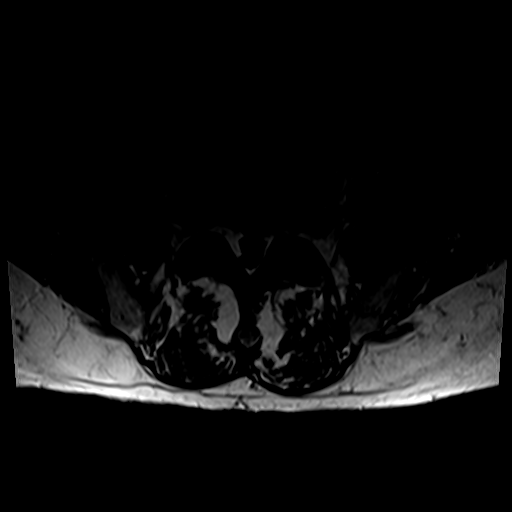
[im 20/39]
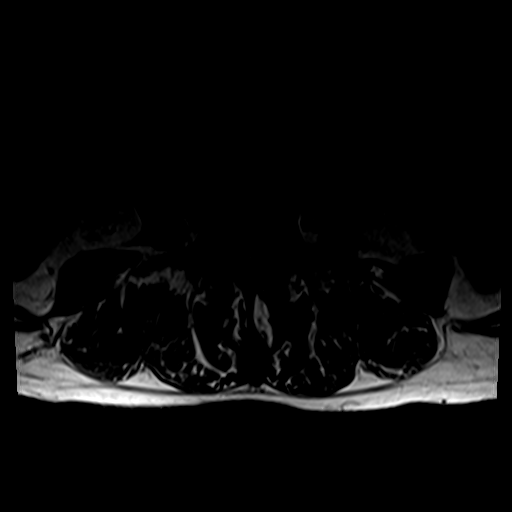
[im 33/39]
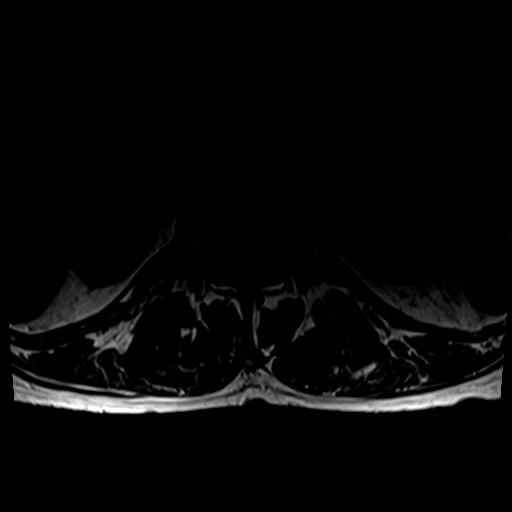

[24 of 48 positions shown; findings below may reference images not displayed]

FINDINGS: Segmentation: Standard. Five non rib-bearing lumbar vertebral bodies
on prior radiographs. The inferior-most fully formed intervertebral
disc is labeled L5-S1.

Alignment: Slight rotatory dextrocurvature. Straightening of the
normal lumbar lordosis. No substantial sagittal subluxation.

Vertebrae: Degenerative/discogenic endplate signal changes about the
L3-L4, L4-L5, and L5-S1 discs. Degenerative endplate signal changes
about and L2 inferior endplate Schmorl's node. Otherwise, no focal
marrow edema in the lumbar vertebral bodies to suggest acute
fracture or discitis/osteomyelitis. Mildly heterogeneous marrow
without suspicious bone lesion.

Partially imaged sacrum: There is extensive T1/T2 hypointensity in
the imaged right sacral ala with suggestion of linear fracture line.
On the left, there is a more discrete linear fracture line that
extends to the left sacroiliac joint. Mild edema on the left. These
findings are partially imaged.

Conus medullaris and cauda equina: Conus extends to the inferior L1
level. Conus appears normal.

Paraspinal and other soft tissues: Small bilateral renal cysts.

Disc levels:

T12-L1: No significant disc protrusion, foraminal stenosis, or canal
stenosis.

L1-L2: Slight disc bulging and mild bilateral facet hypertrophy with
ligamentum flavum thickening. No significant canal or foraminal
stenosis.

L2-L3: Mild disc bulging and mild bilateral facet hypertrophy and
ligamentum flavum thickening. Mildly prominent dorsal epidural fat.
Resulting mild canal stenosis without significant foraminal
stenosis.

L3-L4: Degenerative disc height loss and desiccation. Broad disc
bulge with moderate bilateral facet hypertrophy and ligamentum
flavum thickening. Resulting moderate to severe canal and bilateral
subarticular recess stenosis. No significant foraminal stenosis.

L4-L5: Degenerative disc height loss and desiccation. Posterior disc
bulge and moderate bilateral facet hypertrophy with ligamentum
flavum thickening. Resulting mild canal and bilateral subarticular
recess stenosis. Moderate bilateral foraminal stenosis.

L5-S1: Posterior disc bulge contacts bilateral descending S1 nerve
roots without clear impingement. Somewhat prominent epidural fat
with tapering of the canal. No significant canal stenosis. Moderate
bilateral foraminal stenosis.
IMPRESSION: 1. Findings suggestive of bilateral sacral ala fractures, partially
imaged. Mild edema on the left may represent a recent or unhealed
fracture. An underlying metastasis (particularly a prostate
metastasis given sclerotic change) is not excluded on the right
given the extent of abnormal signal. Recommend correlation with PSA.
Additionally, a CT of the pelvis could further evaluate osseous
detail if clinically indicated.
2. At L3-L4, moderate to severe canal stenosis without significant
foraminal stenosis.
3. At L4-L5, moderate bilateral foraminal stenosis and mild canal
stenosis.
4. At L5-S1, moderate bilateral foraminal stenosis. Posterior disc
contacts bilateral descending S1 nerve roots without clear
impingement.

These results will be called to the ordering clinician or
representative by the Radiologist Assistant, and communication
documented in the PACS or [REDACTED].

## 2020-08-06 NOTE — Addendum Note (Signed)
Addended by: Silverio Decamp on: 08/06/2020 11:00 AM   Modules accepted: Orders, Level of Service

## 2020-08-10 ENCOUNTER — Encounter: Payer: Self-pay | Admitting: Emergency Medicine

## 2020-08-10 ENCOUNTER — Other Ambulatory Visit: Payer: Self-pay

## 2020-08-10 ENCOUNTER — Emergency Department (INDEPENDENT_AMBULATORY_CARE_PROVIDER_SITE_OTHER)
Admission: EM | Admit: 2020-08-10 | Discharge: 2020-08-10 | Disposition: A | Discharge status: Home / Self Care | Payer: Medicare Other | Source: Home / Self Care | Attending: Family Medicine | Admitting: Family Medicine

## 2020-08-10 DIAGNOSIS — R220 Localized swelling, mass and lump, head: Secondary | ICD-10-CM | POA: Diagnosis not present

## 2020-08-10 MED ORDER — CEPHALEXIN 500 MG PO CAPS: ORAL_CAPSULE | ORAL | 0 refills | Status: DC

## 2020-08-10 NOTE — Discharge Instructions (Addendum)
Apply warm compress 3 to 4 times daily. If symptoms become significantly worse during the night or over the weekend, proceed to the local emergency room.  

## 2020-08-10 NOTE — ED Triage Notes (Signed)
Patient states that awoke this morning with the left side of his lip/jaw swollen.  No dental pain, no changes in diet.  Unsure of whats going on.

## 2020-08-10 NOTE — ED Provider Notes (Signed)
Brian Rose CARE    CSN: [id number] Arrival date & time: 08/10/20  1033      History   Chief Complaint Chief Complaint  Patient presents with   Oral Swelling    HPI Brian Rose. is a 80 y.o. male.   Yesterday patient noticed very mild swelling in his left cheek just lateral to his mouth.  Today he awoke with more pronounced swelling and could palpate a nodule in the area.  He denies oral lesions and dental pain.  He feels well otherwise and denies rash, fevers, chills, and sweats. He has end stage renal disease on peritoneal dialysis.  The history is provided by the patient.   Past Medical History:  Diagnosis Date   Chronic kidney disease    Diabetes ([place])    Hypertension     Patient Active Problem List   Diagnosis Date Noted   Right hip pain 07/17/2020   Pharyngoesophageal dysphagia 05/02/2020   Septic shock ([place]) 04/19/2020   Failure to thrive in adult 04/19/2020   Gram-negative bacteremia 03/29/2020   ESRD on peritoneal dialysis ([place]) 03/28/2020   Pyelonephritis 03/28/2020   Severe sepsis ([place]) 03/28/2020   Feeling sick 03/20/2020   Hyperkalemia 01/13/2020   GERD (gastroesophageal reflux disease) 11/16/2019   Progressive focal motor weakness 08/24/2019   Trigger thumb, right thumb 03/26/2018   Bilateral foot pain 12/03/2017   Elevated LDL cholesterol level 09/25/2017   Fatigue 09/02/2017   Secondary hyperparathyroidism of renal origin ([place]) 10/07/4816   Systolic murmur 56/31/4970   Lumbar degenerative disc disease 12/05/2015   Benign essential tremor 09/12/2015   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 No pertinent surgical history.     Home Medications    Prior to Admission medications   Medication Sig Start Date End Date Taking? Authorizing Provider  acetaminophen (TYLENOL) 500 MG tablet Take by mouth.   Yes [provider]  allopurinol (ZYLOPRIM) 100 MG tablet Take by mouth. 08/23/19 08/22/20 Yes [provider]  aspirin 81 MG tablet Take 81 mg by mouth 2 (two) times daily.   Yes [provider]  Blood Glucose Monitoring Suppl (BAYER CONTOUR MONITOR) W/DEVICE KIT Use as needed. 11/29/14  Yes Silverio Decamp, MD  carvedilol (COREG) 6.25 MG tablet Take 1 tablet (6.25 mg total) by mouth 2 (two) times daily with a meal. 02/08/20  Yes Silverio Decamp, MD  cephALEXin (KEFLEX) 500 MG capsule Take one cap PO once daily 08/10/20  Yes Soua Lenk, Ishmael Holter, MD  chlorhexidine (PERIDEX) 0.12 % solution SMARTSIG:0.5 Ounce(s) By Mouth Twice Daily 02/28/20  Yes [provider]  Cholecalciferol 50 MCG (2000 UT) TABS Take 1 tablet by mouth daily.   Yes [provider]  Cyanocobalamin (B-12) 1000 MCG CAPS Take by mouth.   Yes [provider]  epoetin alfa-epbx (RETACRIT) 26378 UNIT/ML injection Inject into the skin. Once a month 04/13/20  Yes [provider]  folic acid (FOLVITE) 588 MCG tablet Take by mouth.   Yes [provider]  glucose blood (PRODIGY NO CODING BLOOD GLUC) test strip 2 (two) times daily. 03/16/19  Yes  [provider]  MULTIPLE VITAMINS-MINERALS PO Take 1 tablet by mouth every morning.   Yes [provider]  Niacin (VITAMIN B-3 PO) Take by mouth.   Yes [provider]  Omega-3 Fatty Acids (FISH OIL) 1000 MG CAPS Take by mouth.   Yes [provider]  pioglitazone (ACTOS) 30 MG tablet Take 1 tablet (30 mg total) by mouth daily. 07/27/20  Yes Silverio Decamp, MD  Prodigy Lancets 28G MISC Use to  check blood sugar 2 time(s) daily 10/19/17  Yes [provider]  bisacodyl (BISACODYL LAXATIVE) 10 MG suppository Place 1 suppository (10 mg total) rectally as needed for moderate constipation. 06/14/16   Kandra Nicolas, MD  Bromfenac Sodium (BROMSITE) 0.075 % SOLN Apply to eye.    [provider]  calcitRIOL (ROCALTROL) 0.25 MCG capsule Take by mouth. 4 days a week.    [provider]  colchicine 0.6 MG tablet 0.3 mg (1/2 tab) no more than twice weekly during flares 01/08/18   Silverio Decamp, MD  famotidine (PEPCID) 20 MG tablet Take by mouth. 12/20/18   [provider]  fluconazole (DIFLUCAN) 100 MG tablet Take 100 mg by mouth daily. 04/14/20   [provider]  HYDROcodone-acetaminophen (NORCO/VICODIN) 5-325 MG tablet Take one by mouth at bedtime as needed for pain 06/15/20   Kandra Nicolas, MD  lactulose (CHRONULAC) 10 GM/15ML solution Take 15 mLs (10 g total) by mouth 2 (two) times daily. 06/14/16   Kandra Nicolas, MD  lidocaine (LIDODERM) 5 % Apply one patch daily.  Remove & Discard patch after12 hours 06/15/20   Kandra Nicolas, MD  midodrine (PROAMATINE) 2.5 MG tablet Take 1 tablet (2.5 mg total) by mouth 3 (three) times daily with meals. 04/19/20   Silverio Decamp, MD  mirtazapine (REMERON) 15 MG tablet Take 1 tablet (15 mg total) by mouth at bedtime. 04/19/20   Silverio Decamp, MD  pantoprazole (PROTONIX) 40 MG tablet Take 1 tablet (40 mg total) by mouth 2 (two) times daily before a meal. 03/20/20   Silverio Decamp, MD  traMADol (ULTRAM) 50 MG tablet Take 1-2 tablets (50-100 mg total) by mouth every 8 (eight) hours as needed for moderate pain. Maximum 6 tabs per day. 07/17/20   Silverio Decamp, MD    Family History Family History  Problem Relation Age of Onset   Diabetes Mother    Hypertension Mother    Hypertension Father    Heart attack Father 8       This is what he died from.   Diabetes Sister     Diabetes Brother    Diabetes Brother    Diabetes Sister     Social History Social History   Tobacco Use   Smoking status: Former    Packs/day: 1.00    Years: 20.00    Pack years: 20.00    Types: Cigars, Cigarettes    Quit date: 09/06/1981    Years since quitting: 38.9   Smokeless tobacco: Never  Substance Use Topics   Alcohol use: No   Drug use: No     Allergies   Patient has no known allergies.   Review of Systems Review of Systems  Constitutional:  Negative for activity change, appetite change, chills, diaphoresis, fatigue and fever.  HENT:  Positive for facial swelling. Negative for congestion, dental problem, ear pain, mouth sores, rhinorrhea, sinus pain, sore throat and trouble swallowing.   Eyes: Negative.   Respiratory: Negative.  Cardiovascular: Negative.   Genitourinary: Negative.   Musculoskeletal: Negative.   Skin:  Negative for color change and rash.  Neurological: Negative.     Physical Exam Triage Vital Signs ED Triage Vitals  Enc Vitals Group     BP 08/10/20 1146 132/70     Pulse Rate 08/10/20 1146 80     Resp --      Temp 08/10/20 1146 98.5 F (36.9 C)     Temp Source 08/10/20 1146 Oral     SpO2 08/10/20 1146 100 %     Weight --      Height --      Head Circumference --      Peak Flow --      Pain Score 08/10/20 1147 0     Pain Loc --      Pain Edu? --      Excl. in [place]? --    No data found.  Updated Vital Signs BP 132/70 (BP Location: Right Arm)   Pulse 80   Temp 98.5 F (36.9 C) (Oral)   SpO2 100%   Visual Acuity Right Eye Distance:   Left Eye Distance:   Bilateral Distance:    Right Eye Near:   Left Eye Near:    Bilateral Near:     Physical Exam Vitals and nursing note reviewed.  Constitutional:      General: He is not in acute distress. HENT:     Head: Atraumatic.      Comments: Left cheek just lateral to mouth has mild swelling without erythema, induration, or fluctuance.  No rash present.  There is a palpable  subcutaneous nodule as noted on diagram, tender to palpation.      Right Ear: External ear normal.     Left Ear: External ear normal.     Nose: Nose normal.     Mouth/Throat:     Pharynx: Oropharynx is clear. No oropharyngeal exudate or posterior oropharyngeal erythema.  Eyes:     Conjunctiva/sclera: Conjunctivae normal.     Pupils: Pupils are equal, round, and reactive to light.  Cardiovascular:     Rate and Rhythm: Normal rate.  Pulmonary:     Effort: Pulmonary effort is normal.  Musculoskeletal:     Cervical back: Neck supple.     Right lower leg: No edema.     Left lower leg: No edema.  Lymphadenopathy:     Cervical: No cervical adenopathy.  Skin:    General: Skin is warm and dry.     Findings: No rash.  Neurological:     Mental Status: He is alert.     UC Treatments / Results  Labs (all labs ordered are listed, but only abnormal results are displayed) Labs Reviewed - No data to display  EKG   Radiology No results found.  Procedures Procedures (including critical care time)  Medications Ordered in UC Medications - No data to display  Initial Impression / Assessment and Plan / UC Course  I have reviewed the triage vital signs and the nursing notes.  Pertinent labs & imaging results that were available during my care of the patient were reviewed by me and considered in my medical decision making (see chart for details).    Cystic lesion ?etiology.  Begin empiric Keflex. Followup with ENT as soon as possible.   Final Clinical Impressions(s) / UC Diagnoses   Final diagnoses:  Swelling, cheek     Discharge Instructions      Apply warm compress 3  to 4 times daily.  If symptoms become significantly worse during the night or over the weekend, proceed to the local emergency room.    ED Prescriptions     Medication Sig Dispense Auth. Provider   cephALEXin (KEFLEX) 500 MG capsule Take one cap PO once daily 10 capsule Kandra Nicolas, MD          Kandra Nicolas, MD 08/11/20 1350

## 2020-08-16 ENCOUNTER — Ambulatory Visit (INDEPENDENT_AMBULATORY_CARE_PROVIDER_SITE_OTHER): Payer: Medicare Other | Admitting: Sports Medicine

## 2020-08-16 ENCOUNTER — Other Ambulatory Visit: Payer: Self-pay

## 2020-08-16 ENCOUNTER — Encounter: Payer: Self-pay | Admitting: Sports Medicine

## 2020-08-16 DIAGNOSIS — M48061 Spinal stenosis, lumbar region without neurogenic claudication: Secondary | ICD-10-CM

## 2020-08-16 NOTE — Assessment & Plan Note (Signed)
Sheikh returns to follow-up his back pain, he is a pleasant 80 year old male with multiple medical problems, he did have some pain in his back and hip, a femoroacetabular joint injection by an outside provider did not provide any relief, not even temporary, ultimately got an MRI that showed severe stenosis at L3-L4, he also has a history of a pelvic fracture, we did see a lot of edema in the sacrum on the MRI, radiology has recommended we go ahead and get a PSA to ensure were not dealing with metastatic prostate cancer, he is getting these labs drawn today. Fortunately his back pain has for the most part resolved, he is able to ambulate without a cane, we will hold off on any epidurals for now. Return as needed.

## 2020-08-16 NOTE — Progress Notes (Signed)
    Procedures performed today:    None.  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  Brief History, Exam, Impression, and Recommendations:    Lumbar spinal stenosis Brian Rose returns to follow-up his back pain, he is a pleasant 80 year old male with multiple medical problems, he did have some pain in his back and hip, a femoroacetabular joint injection by an outside provider did not provide any relief, not even temporary, ultimately got an MRI that showed severe stenosis at L3-L4, he also has a history of a pelvic fracture, we did see a lot of edema in the sacrum on the MRI, radiology has recommended we go ahead and get a PSA to ensure were not dealing with metastatic prostate cancer, he is getting these labs drawn today. Fortunately his back pain has for the most part resolved, he is able to ambulate without a cane, we will hold off on any epidurals for now. Return as needed.    ___________________________________________ Gwen Her. Dianah Field, M.D., ABFM., CAQSM. Primary Care and [place] Instructor of [place] of [facility] of Medicine

## 2020-08-17 LAB — PSA, TOTAL AND FREE
PSA, % Free: 35 % (calc) (ref >25)
PSA, Free: 0.7 ng/mL
PSA, Total: 2 ng/mL (ref <=4.0)

## 2020-08-17 LAB — COMPREHENSIVE METABOLIC PANEL
AG Ratio: 1.2 (calc) (ref 1.0–2.5)
ALT: 19 U/L (ref 9–46)
AST: 33 U/L (ref 10–35)
Albumin: 3.6 g/dL (ref 3.6–5.1)
Alkaline phosphatase (APISO): 162 U/L — ABNORMAL HIGH (ref 35–144)
BUN/Creatinine Ratio: 5 (calc) — ABNORMAL LOW (ref 6–22)
BUN: 62 mg/dL — ABNORMAL HIGH (ref 7–25)
CO2: 25 mmol/L (ref 20–32)
Calcium: 9.2 mg/dL (ref 8.6–10.3)
Chloride: 96 mmol/L — ABNORMAL LOW (ref 98–110)
Creat: 12.26 mg/dL — ABNORMAL HIGH (ref 0.70–1.11)
Globulin: 3 g/dL (calc) (ref 1.9–3.7)
Glucose, Bld: 113 mg/dL — ABNORMAL HIGH (ref 65–99)
Potassium: 4.6 mmol/L (ref 3.5–5.3)
Sodium: 136 mmol/L (ref 135–146)
Total Bilirubin: 0.3 mg/dL (ref 0.2–1.2)
Total Protein: 6.6 g/dL (ref 6.1–8.1)

## 2020-08-17 LAB — HEMOGLOBIN A1C
Hgb A1c MFr Bld: 6 % of total Hgb — ABNORMAL HIGH (ref <5.7)
Mean Plasma Glucose: 126 mg/dL
eAG (mmol/L): 7 mmol/L

## 2020-08-17 LAB — CBC
HCT: 36.9 % — ABNORMAL LOW (ref 38.5–50.0)
Hemoglobin: 11.6 g/dL — ABNORMAL LOW (ref 13.2–17.1)
MCH: 26.7 pg — ABNORMAL LOW (ref 27.0–33.0)
MCHC: 31.4 g/dL — ABNORMAL LOW (ref 32.0–36.0)
MCV: 85 fL (ref 80.0–100.0)
Platelets: 175 10*3/uL (ref 140–400)
RBC: 4.34 10*6/uL (ref 4.20–5.80)
RDW: 19.3 % — ABNORMAL HIGH (ref 11.0–15.0)
WBC: 5.6 10*3/uL (ref 3.8–10.8)

## 2020-08-17 LAB — LIPID PANEL
Cholesterol: 206 mg/dL — ABNORMAL HIGH (ref <200)
HDL: 47 mg/dL (ref >=40)
LDL Cholesterol (Calc): 138 mg/dL (calc) — ABNORMAL HIGH
Non-HDL Cholesterol (Calc): 159 mg/dL (calc) — ABNORMAL HIGH (ref <130)
Total CHOL/HDL Ratio: 4.4 (calc) (ref <5.0)
Triglycerides: 107 mg/dL (ref <150)

## 2020-08-17 LAB — TSH: TSH: 1.18 mIU/L (ref 0.40–4.50)

## 2020-09-07 ENCOUNTER — Telehealth (INDEPENDENT_AMBULATORY_CARE_PROVIDER_SITE_OTHER): Payer: Medicare Other | Admitting: Medical-Surgical

## 2020-09-07 ENCOUNTER — Encounter: Payer: Self-pay | Admitting: Medical-Surgical

## 2020-09-07 ENCOUNTER — Telehealth: Payer: Self-pay

## 2020-09-07 DIAGNOSIS — U071 COVID-19: Secondary | ICD-10-CM

## 2020-09-07 MED ORDER — MOLNUPIRAVIR EUA 200MG CAPSULE: 4.0000 | ORAL_CAPSULE | Freq: Two times a day (BID) | ORAL | 0 refills | Status: AC

## 2020-09-07 NOTE — Progress Notes (Signed)
Virtual Visit via Telephone   I connected with  Brian Rose.  on 09/07/20 by telephone/telehealth and verified that I am speaking with the correct person using two identifiers.   I discussed the limitations, risks, security and privacy concerns of performing an evaluation and management service by telephone, including the higher likelihood of inaccurate diagnosis and treatment, and the availability of in person appointments.  We also discussed the likely need of an additional face to face encounter for complete and high quality delivery of care.  I also discussed with the patient that there may be a patient responsible charge related to this service. The patient expressed understanding and wishes to proceed.  Provider location is in medical facility. Patient location is at their home, different from provider location. People involved in care of the patient during this telehealth encounter were myself, my nurse/medical assistant, and my front office/scheduling team member.  CC: COVID positive  HPI: Pleasant 80 year old male presenting via telephone visit with a reports of testing positive for COVID today.  He has had symptoms over the last 2 days including sore throat, nonproductive cough, rhinorrhea, and weakness.  Denies fever, chills, shortness of breath, chest pain, GI symptoms, and mental status changes.  He has tried drinking cold and flu tea once or twice but has not taking any other over-the-counter medications.  He is on peritoneal dialysis and has notified his dialysis center regarding his COVID test results.  He was told to reschedule his appointment that was originally set for Monday with them but received no other instruction.  Review of Systems: See HPI for pertinent positives and negatives.   Objective Findings:    General: Speaking full sentences, no audible heavy breathing.  Sounds alert and appropriately interactive.    Impression and Recommendations:    1. COVID-19 virus  infection Several significant comorbidities so starting Molnupiravir '800mg'$  BID x 5 days. With his age and health status, referring for antibody infusion.  Reviewed emergency symptoms that should prompt ED/urgent care evaluation.  Okay to use Tylenol for fever and/or body aches.  Increase rest.  If unable to eat or drink appropriately, recommend in person evaluation.  I discussed the above assessment and treatment plan with the patient. The patient was provided an opportunity to ask questions and all were answered. The patient agreed with the plan and demonstrated an understanding of the instructions.   The patient was advised to call back or seek an in-person evaluation if the symptoms worsen or if the condition fails to improve as anticipated.  15 minutes of non-face-to-face time was provided during this encounter.  Return if symptoms worsen or fail to improve. ___________________________________________ Brian Bouche, DNP, APRN, FNP-BC Primary Care and [place]

## 2020-09-07 NOTE — Telephone Encounter (Signed)
Scheduled

## 2020-09-07 NOTE — Telephone Encounter (Signed)
Patient tested COVID + this morning. His symptoms started 2 days ago with runny nose and cough, no fever. He wants to know if there is anything he should be taking for this. Verified patient's pharmacy.

## 2020-09-11 ENCOUNTER — Telehealth: Payer: Self-pay

## 2020-09-11 NOTE — Telephone Encounter (Signed)
Received faxed orders for monoclonal antibody infusion and contacted patient to schedule appointment.  Patient stated he was not told provider had ordered the treatment and he was not interested.

## 2020-09-13 ENCOUNTER — Ambulatory Visit: Payer: Medicare Other | Admitting: Podiatry

## 2020-09-20 ENCOUNTER — Other Ambulatory Visit: Payer: Self-pay

## 2020-09-20 DIAGNOSIS — A419 Sepsis, unspecified organism: Secondary | ICD-10-CM

## 2020-09-20 MED ORDER — MIDODRINE HCL 2.5 MG PO TABS: 2.5000 mg | ORAL_TABLET | Freq: Three times a day (TID) | ORAL | 0 refills | Status: DC

## 2020-09-20 NOTE — Telephone Encounter (Signed)
Patient aware refill was sent in as requested.

## 2020-09-20 NOTE — Telephone Encounter (Signed)
Last OV: 08/16/20 Last Fill: 04/19/20 #30  Patient called requesting a refill on this medication.

## 2020-12-10 ENCOUNTER — Encounter: Payer: Self-pay | Admitting: Sports Medicine

## 2020-12-10 ENCOUNTER — Ambulatory Visit (INDEPENDENT_AMBULATORY_CARE_PROVIDER_SITE_OTHER): Payer: Medicare Other | Admitting: Sports Medicine

## 2020-12-10 ENCOUNTER — Other Ambulatory Visit: Payer: Self-pay

## 2020-12-10 DIAGNOSIS — I10 Essential (primary) hypertension: Secondary | ICD-10-CM | POA: Diagnosis not present

## 2020-12-10 DIAGNOSIS — R209 Unspecified disturbances of skin sensation: Secondary | ICD-10-CM | POA: Diagnosis not present

## 2020-12-10 MED ORDER — METOPROLOL TARTRATE 50 MG PO TABS: 50.0000 mg | ORAL_TABLET | Freq: Two times a day (BID) | ORAL | 11 refills | Status: DC

## 2020-12-10 NOTE — Assessment & Plan Note (Signed)
Brian Rose is a pleasant 80 year old male, he is on peritoneal dialysis for arteriolonephrosclerosis. He has noted increasing cold sensation in his left foot, occurs randomly through the day, no trauma, no overt numbness or tingling. He does not endorse significant calf claudication. On exam I am able to palpate dorsalis pedis and posterior tibial artery pulses. Considering his history of vascular disease, new onset cold sensation in his left foot I would like a CT aortobifemoral with bilateral lower extremity runoff to evaluate for peripheral arterial disease and/or arterial dissection. If we do not get an answer from the CT we will consider epidural injection, MRI in chart.

## 2020-12-10 NOTE — Assessment & Plan Note (Signed)
Currently on metoprolol 25 twice daily, carvedilol discontinued, blood pressures continue to run a bit high, target is less than 140/90, increasing metoprolol to 50 mg twice daily.

## 2020-12-10 NOTE — Progress Notes (Signed)
    Procedures performed today:    None.  Independent interpretation of notes and tests performed by another provider:   None.  Brief History, Exam, Impression, and Recommendations:    Cold left foot Brian Rose is a pleasant 80 year old male, he is on peritoneal dialysis for arteriolonephrosclerosis. He has noted increasing cold sensation in his left foot, occurs randomly through the day, no trauma, no overt numbness or tingling. He does not endorse significant calf claudication. On exam I am able to palpate dorsalis pedis and posterior tibial artery pulses. Considering his history of vascular disease, new onset cold sensation in his left foot I would like a CT aortobifemoral with bilateral lower extremity runoff to evaluate for peripheral arterial disease and/or arterial dissection. If we do not get an answer from the CT we will consider epidural injection, MRI in chart.  Essential hypertension, benign Currently on metoprolol 25 twice daily, carvedilol discontinued, blood pressures continue to run a bit high, target is less than 140/90, increasing metoprolol to 50 mg twice daily.    ___________________________________________ Gwen Her. Dianah Field, M.D., ABFM., CAQSM. Primary Care and [place] Instructor of [place] of [facility] of Medicine

## 2020-12-17 ENCOUNTER — Ambulatory Visit (INDEPENDENT_AMBULATORY_CARE_PROVIDER_SITE_OTHER): Payer: Medicare Other

## 2020-12-17 ENCOUNTER — Other Ambulatory Visit: Payer: Self-pay

## 2020-12-17 DIAGNOSIS — R209 Unspecified disturbances of skin sensation: Secondary | ICD-10-CM | POA: Diagnosis not present

## 2020-12-17 DIAGNOSIS — M79662 Pain in left lower leg: Secondary | ICD-10-CM | POA: Diagnosis not present

## 2020-12-17 IMAGING — CT CT ANGIO AOBIFEM WO/W CM
2 of 21 series · 11 of 48 positions shown, 16 images · IV contrast (APPLIED)
Comparison: None.

CLINICAL DATA: Left lower extremity pain and intermittent coldness.
Evaluate atherosclerotic disease versus dissection.

EXAM:
CT ANGIOGRAPHY OF ABDOMINAL AORTA WITH ILIOFEMORAL RUNOFF
TECHNIQUE: Multidetector CT imaging of the abdomen, pelvis and lower
extremities was performed using the standard protocol during bolus
administration of intravenous contrast. Multiplanar CT image
reconstructions and MIPs were obtained to evaluate the vascular
anatomy.
CONTRAST:  125mL OMNIPAQUE IOHEXOL 350 MG/ML SOLN

[Series 21: axial arterial · axial · arterial · 0.77mm/px · z∈[-1180,-912]mm · 3 of 337 slices shown (1 of 2)]
[im 68/337  soft-tissue]
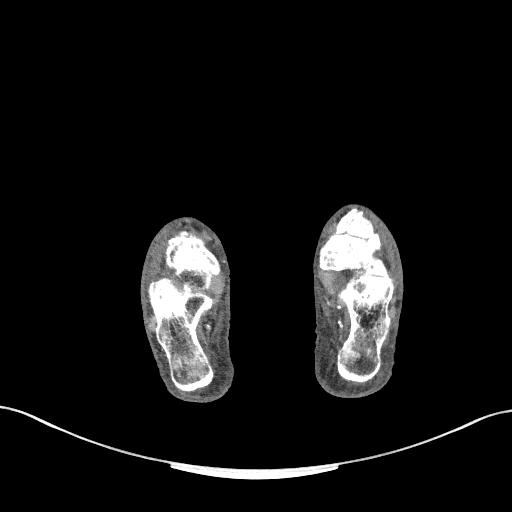
[im 135/337  soft-tissue]
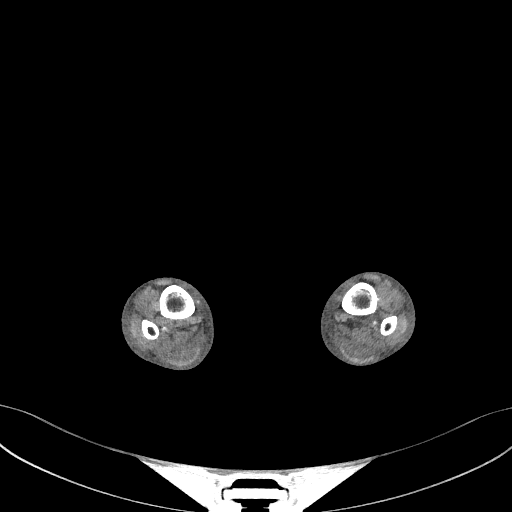
[im 202/337  soft-tissue]
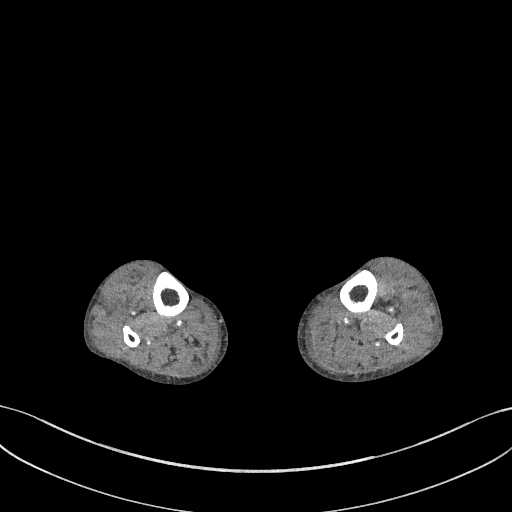

[Series 27: axial arterial · axial · arterial · 0.77mm/px · z∈[-727,+35]mm · 8 of 491 slices shown, 13 images (2 of 2)]
[im 55/491  soft-tissue]
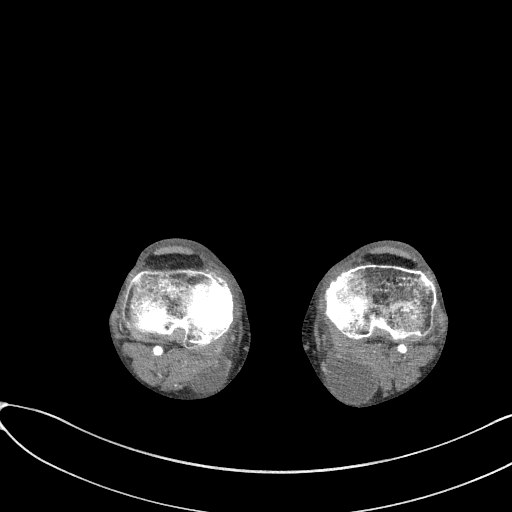
[im 55/491  bone]
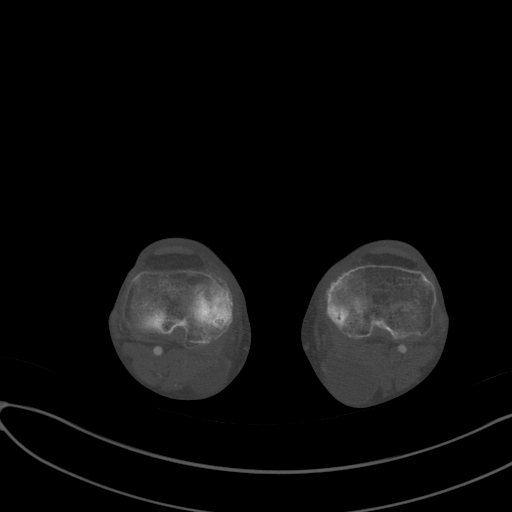
[im 109/491  soft-tissue]
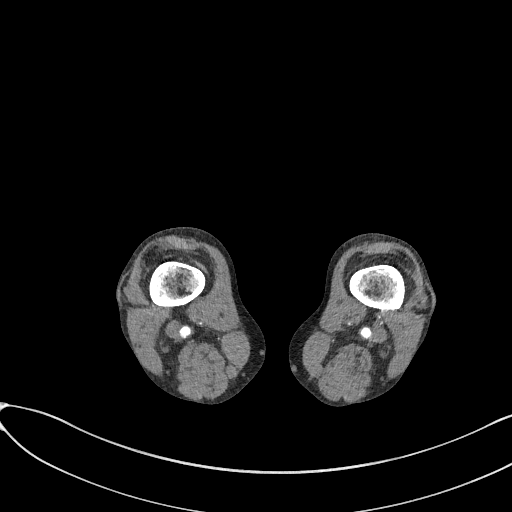
[im 164/491  soft-tissue]
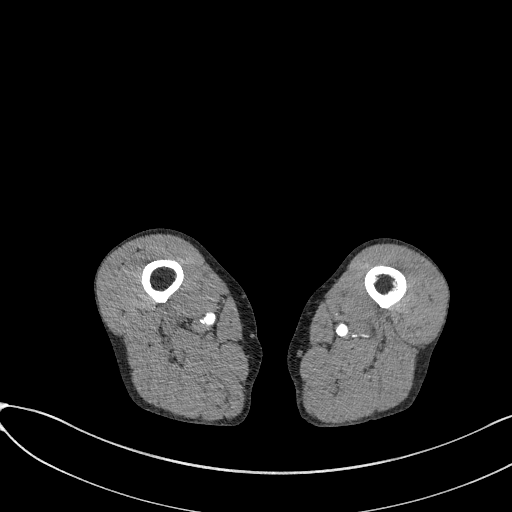
[im 218/491  soft-tissue]
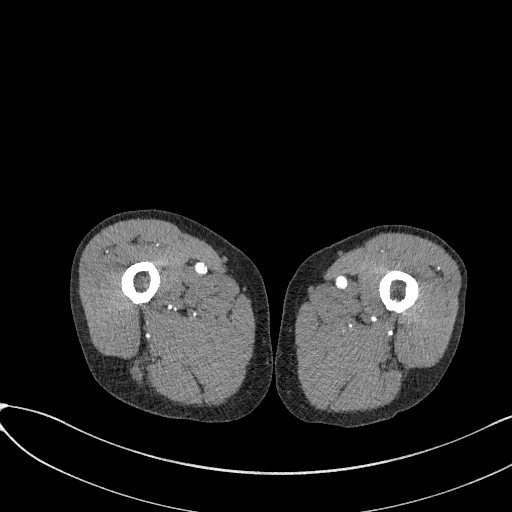
[im 273/491  soft-tissue]
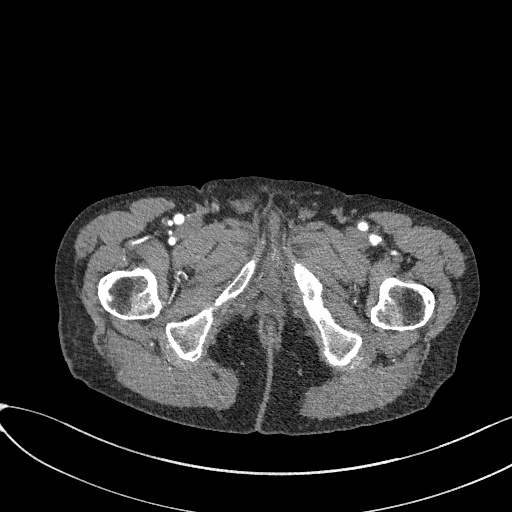
[im 273/491  lung]
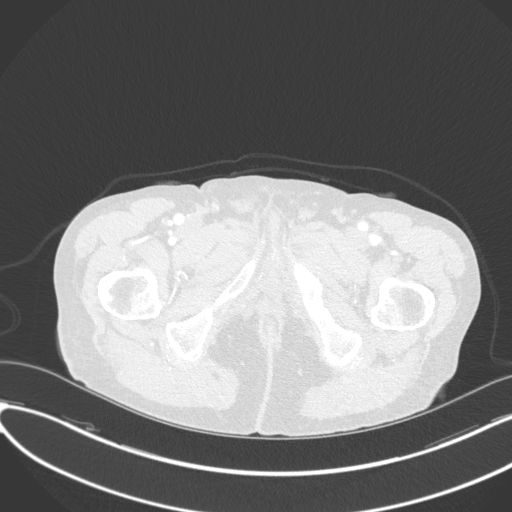
[im 327/491  soft-tissue]
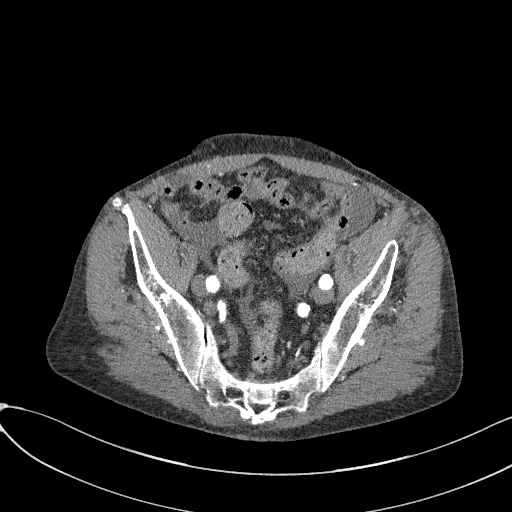
[im 327/491  lung]
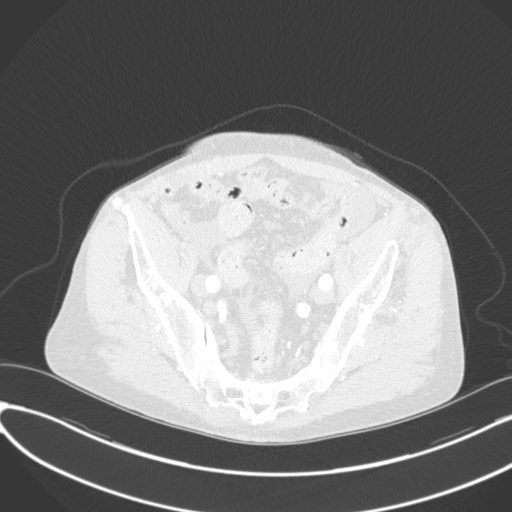
[im 382/491  soft-tissue]
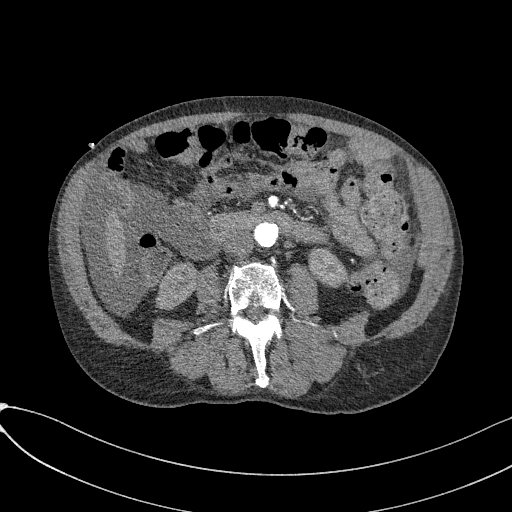
[im 382/491  lung]
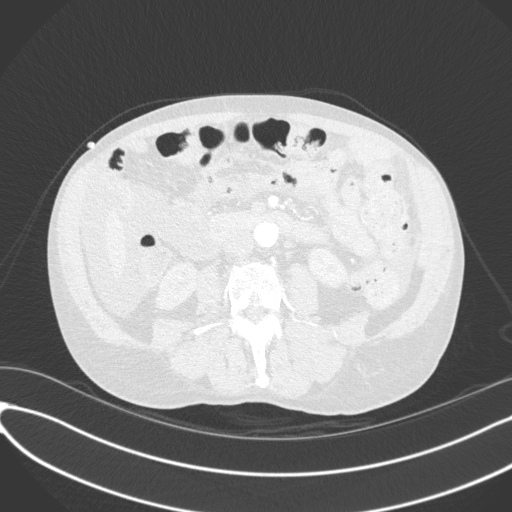
[im 436/491  soft-tissue]
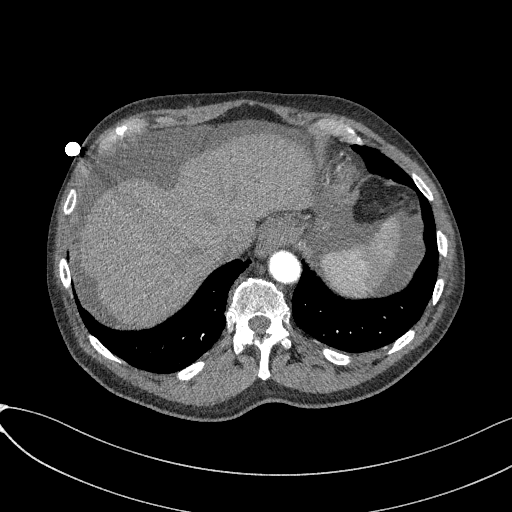
[im 436/491  lung]
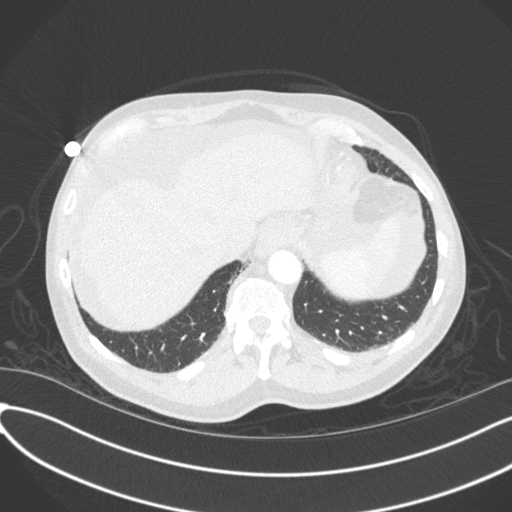

[11 of 48 positions shown; findings below may reference images not displayed]

FINDINGS: VASCULAR

Aorta: Mild scattered calcified atherosclerotic plaque. No evidence
of aneurysm or dissection.

Celiac: Patent without evidence of aneurysm, dissection, vasculitis
or significant stenosis.

SMA: Patent without evidence of aneurysm, dissection, vasculitis or
significant stenosis.

Renals: Both renal arteries are patent without evidence of aneurysm,
dissection, vasculitis, fibromuscular dysplasia or significant
stenosis.

IMA: Patent without evidence of aneurysm, dissection, vasculitis or
significant stenosis.

RIGHT Lower Extremity

Inflow: Common, internal and external iliac arteries are patent
without evidence of aneurysm, dissection, vasculitis or significant
stenosis.

Outflow: Common, superficial and profunda femoral arteries and the
popliteal artery are patent without evidence of aneurysm,
dissection, vasculitis or significant stenosis.

Runoff: Occlusion of the anterior tibial artery in the proximal to
mid calf. The posterior tibial artery is diffusely diseased and
occludes segmentally just above the ankle. The peroneal vessel
remains patent to the ankle provide single-vessel runoff.

LEFT Lower Extremity

Inflow: Common, internal and external iliac arteries are patent
without evidence of aneurysm, dissection, vasculitis or significant
stenosis.

Outflow: Common, superficial and profunda femoral arteries and the
popliteal artery are patent without evidence of aneurysm,
dissection, vasculitis or significant stenosis.

Runoff: Diffusely diseased runoff arteries. The anterior and
posterior tibial arteries both occlude by the distal calf. The
peroneal artery remains patent and provides single-vessel runoff to
the ankle.

Veins: No obvious venous abnormality within the limitations of this
arterial phase study.

Review of the MIP images confirms the above findings.

NON-VASCULAR

Lower chest: No acute abnormality.

Hepatobiliary: No focal liver abnormality is seen. No gallstones,
gallbladder wall thickening, or biliary dilatation.

Pancreas: Unremarkable. No pancreatic ductal dilatation or
surrounding inflammatory changes.

Spleen: Normal in size without focal abnormality.

Adrenals/Urinary Tract: Normal adrenal glands. Atrophied native
kidneys. Unremarkable ureters. Under filled bladder.

Stomach/Bowel: Stomach is within normal limits. Appendix appears
normal. No evidence of bowel wall thickening, distention, or
inflammatory changes.

Lymphatic: No suspicious lymphadenopathy.

Reproductive: Unremarkable.

Other: Moderate volume ascites with Tenckhoff peritoneal dialysis
catheter in place. The catheter enters via the midline just inferior
to the umbilicus and is coiled in the left lower quadrant.

Musculoskeletal: Bilateral Baker's cysts slightly larger on the left
than the right. Severe hallux valgus angulation of the right great
toe and moderate hallux valgus angulation of the left great toe.
Multilevel degenerative disc disease throughout the lower lumbar
spine. Fibrous nonunion of remote healed fracture of the left
superior and inferior pubic rami. Bilateral hip joint degenerative
osteoarthritis.
IMPRESSION: VASCULAR

1. Bilateral runoff (below the knee) disease with chronic occlusion
of the anterior and posterior tibial arteries. The peroneal arteries
provide single-vessel runoff to the feet bilaterally.
2. Otherwise, scattered calcified atherosclerotic plaques without
significant stenosis in the inflow (aortoiliac) or outflow
(femoropopliteal) segments.
3.  Aortic Atherosclerosis ([L9]-[L9]).

NON-VASCULAR

1. No acute abnormality within the abdomen or pelvis.
2. Bilateral left larger than right Baker's cysts.
3. Bilateral hip joint osteoarthritis.
4. Multilevel lower lumbar degenerative disc disease.
5. Fibrous nonunion of healed left superior and inferior pubic rami
fractures.
6. Bilateral hallux valgus angulation of the great toes worse on the
right than the left.
7. Atrophied native kidneys with Tenckhoff peritoneal dialysis
catheter coiled in the left abdomen.

## 2020-12-17 MED ORDER — IOHEXOL 350 MG/ML SOLN
200.0000 mL | Freq: Once | INTRAVENOUS | Status: AC | PRN
  Administered 2020-12-17: 125 mL via INTRAVENOUS

## 2020-12-19 ENCOUNTER — Other Ambulatory Visit: Payer: Self-pay

## 2020-12-19 ENCOUNTER — Ambulatory Visit (INDEPENDENT_AMBULATORY_CARE_PROVIDER_SITE_OTHER): Payer: Medicare Other | Admitting: Sports Medicine

## 2020-12-19 DIAGNOSIS — M48061 Spinal stenosis, lumbar region without neurogenic claudication: Secondary | ICD-10-CM | POA: Diagnosis not present

## 2020-12-19 DIAGNOSIS — I10 Essential (primary) hypertension: Secondary | ICD-10-CM | POA: Diagnosis not present

## 2020-12-19 NOTE — Assessment & Plan Note (Signed)
Blood pressures fairly labile, currently on metoprolol 50 mg twice daily with blood pressures continuing to run high sometimes into the 709K and 957M systolic, he is not taking his midodrine. Pulses are in the 60s, so we will add amlodipine 5 daily, return to see me in a month.

## 2020-12-19 NOTE — Assessment & Plan Note (Signed)
Brian Rose returns, he is a very pleasant 80 year old male, end-stage renal disease on peritoneal dialysis, severe spinal stenosis at the L3-L4 level, he had initially come in complaining of cold left foot with abnormal sensations. We obtained a CT aortobifemoral with bilateral tibial artery runoff, no obvious areas of vascular stenosis. At the time he also had fairly good dorsalis pedis and posterior tibial pulses. With an essentially normal angiogram I think we should focus more on his lumbar spinal stenosis as a cause of the abnormal sensations in his leg, oral medications not effective, proceeding with left L3-L4 interlaminar epidural. Return to see me after the injection.

## 2020-12-19 NOTE — Progress Notes (Signed)
    Procedures performed today:    None.  Independent interpretation of notes and tests performed by another provider:   None.  Brief History, Exam, Impression, and Recommendations:    Lumbar spinal stenosis Brian Rose returns, he is a very pleasant 80 year old male, end-stage renal disease on peritoneal dialysis, severe spinal stenosis at the L3-L4 level, he had initially come in complaining of cold left foot with abnormal sensations. We obtained a CT aortobifemoral with bilateral tibial artery runoff, no obvious areas of vascular stenosis. At the time he also had fairly good dorsalis pedis and posterior tibial pulses. With an essentially normal angiogram I think we should focus more on his lumbar spinal stenosis as a cause of the abnormal sensations in his leg, oral medications not effective, proceeding with left L3-L4 interlaminar epidural. Return to see me after the injection.  Essential hypertension, benign Blood pressures fairly labile, currently on metoprolol 50 mg twice daily with blood pressures continuing to run high sometimes into the 168H and 729M systolic, he is not taking his midodrine. Pulses are in the 60s, so we will add amlodipine 5 daily, return to see me in a month.    ___________________________________________ Gwen Her. Dianah Field, M.D., ABFM., CAQSM. Primary Care and [place] Instructor of [place] [facility]

## 2020-12-27 ENCOUNTER — Other Ambulatory Visit: Payer: Medicare Other

## 2021-01-03 ENCOUNTER — Inpatient Hospital Stay: Admission: RE | Admit: 2021-01-03 | Payer: Medicare Other | Source: Ambulatory Visit

## 2021-01-07 ENCOUNTER — Ambulatory Visit: Payer: Medicare Other | Admitting: Sports Medicine

## 2021-01-07 ENCOUNTER — Telehealth: Payer: Self-pay

## 2021-01-07 DIAGNOSIS — M1000 Idiopathic gout, unspecified site: Secondary | ICD-10-CM

## 2021-01-07 NOTE — Telephone Encounter (Signed)
Patient has called back several times. He missed appt today as he was unable to walk well. He states usually he takes Allopurinol and this helps but nothing is helping him today. He states the pain is very bad in his thumbs and ankles.   He would like to know if there is something else he can take?

## 2021-01-07 NOTE — Telephone Encounter (Signed)
Patient called in and states his ankles , wrists and thumbs are very sore today. He has an appt with you at 2:15 but states he is barely able to get dressed or walk today.

## 2021-01-08 ENCOUNTER — Telehealth: Payer: Medicare Other | Admitting: Physician Assistant

## 2021-01-08 DIAGNOSIS — M109 Gout, unspecified: Secondary | ICD-10-CM | POA: Diagnosis not present

## 2021-01-08 MED ORDER — PREDNISONE 20 MG PO TABS: 40.0000 mg | ORAL_TABLET | Freq: Every day | ORAL | 0 refills | Status: AC

## 2021-01-08 MED ORDER — HYDROCODONE-ACETAMINOPHEN 5-325 MG PO TABS: 1.0000 | ORAL_TABLET | Freq: Three times a day (TID) | ORAL | 0 refills | Status: DC | PRN

## 2021-01-08 NOTE — Telephone Encounter (Signed)
Brian Rose called and left a message stating he has a gout flare up. He is requesting medication.

## 2021-01-08 NOTE — Progress Notes (Signed)
E-Visit for Gout Symptoms  We are sorry that you are not feeling well. We are here to help!  Based on what you shared with me it looks like you have a flare of your gout.  Gout is a form of arthritis. It can cause pain and swelling in the joints. At first, it tends to affect only 1 joint - most frequently the big toe. It happens in people who have too much uric acid in the blood. Uric acid is a chemical that is produced when the body breaks down certain foods. Uric acid can form sharp needle-like crystals that build up in the joints and cause pain. Uric acid crystals can also form inside the tubes that carry urine from the kidneys to the bladder. These crystals can turn into "kidney stones" that can cause pain and problems with the flow of urine. People with gout get sudden "flares" or attacks of severe pain, most often the big toe, ankle, or knee. Often the joint also turns red and swells. Usually, only 1 joint is affected, but some people have pain in more than 1 joint. Gout flares tend to happen more often during the night.  The pain from gout can be extreme. The pain and swelling are worst at the beginning of a gout flare. The symptoms then get better within a few days to weeks. It is not clear how the body "turns off" a gout flare.  Do not start any NEW preventative medicine until the gout has cleared completely. However, If you are already on Probenecid or Allopurinol for CHRONIC gout, you may continue taking this during an active flare up  I have prescribed Prednisone 40 mg daily for 5 days since this is safest giving your history of chronic kidney disease and your recent A1C (marker of diabetes) was in a good range.  I do recommend you schedule follow-up with your PCP as well regarding this once flare is over or if things are not resolving, so they can make sure you do not need adjustments in your preventive medication.    HOME CARE Losing weight can help relieve gout. It's not clear that  following a specific diet plan will help with gout symptoms but eating a balanced diet can help improve your overall health. It can also help you lose weight, if you are overweight. In general, a healthy diet includes plenty of fruits, vegetables, whole grains, and low-fat dairy products (labelled "low fat", skim, 2%). Avoid sugar sweetened drinks (including sodas, tea, juice and juice blends, coffee drinks and sports drinks) Limit alcohol to 1-2 drinks of beer, spirits or wine daily these can make gout flares worse. Some people with gout also have other health problems, such as heart disease, high blood pressure, kidney disease, or obesity. If you have any of these issues, it's important to work with your doctor to manage them. This can help improve your overall health and might also help with your gout.  GET HELP RIGHT AWAY IF: Your symptoms persist after you have completed your treatment plan You develop severe diarrhea You develop abnormal sensations  You develop vomiting,   You develop weakness  You develop abdominal pain  FOLLOW UP WITH YOUR PRIMARY PROVIDER IF: If your symptoms do not improve within 10 days  MAKE SURE YOU  Understand these instructions. Will watch your condition. Will get help right away if you are not doing well or get worse.  Thank you for choosing an e-visit.  Your e-visit answers were reviewed  by a board certified advanced clinical practitioner to complete your personal care plan. Depending upon the condition, your plan could have included both over the counter or prescription medications.  Please review your pharmacy choice. Make sure the pharmacy is open so you can pick up prescription now. If there is a problem, you may contact your provider through [employer] and have the prescription routed to another pharmacy.  Your safety is important to [place]. If you have drug allergies check your prescription carefully.   For the next 24 hours you can use MyChart to  ask questions about today's visit, request a non-urgent call back, or ask for a work or school excuse. You will get an email in the next two days asking about your experience. I hope that your e-visit has been valuable and will speed your recovery.

## 2021-01-08 NOTE — Telephone Encounter (Signed)
I can give him something for pain, will do low-dose hydrocodone for now, continue prednisone as well.

## 2021-01-08 NOTE — Telephone Encounter (Signed)
Patient had an e-visit with another provider at 830a this morning and was given prednisone. He left a a msg on my VM at 815a asking for pain meds. Patient upset he did not get a response from yesterday's message. Please advise.

## 2021-01-08 NOTE — Telephone Encounter (Signed)
Patient aware to continue prednisone until gone and to only take the hydrocodone if needed for pain.

## 2021-01-08 NOTE — Progress Notes (Signed)
I have spent 5 minutes in review of e-visit questionnaire, review and updating patient chart, medical decision making and response to patient.   Kolbie Lepkowski Cody Deina Lipsey, PA-C    

## 2021-01-14 ENCOUNTER — Encounter: Payer: Self-pay | Admitting: Family Medicine

## 2021-01-14 ENCOUNTER — Ambulatory Visit (INDEPENDENT_AMBULATORY_CARE_PROVIDER_SITE_OTHER): Payer: Medicare Other | Admitting: Family Medicine

## 2021-01-14 ENCOUNTER — Other Ambulatory Visit: Payer: Self-pay

## 2021-01-14 VITALS — BP 118/70 | HR 99 | Wt 154.1 lb

## 2021-01-14 DIAGNOSIS — J329 Chronic sinusitis, unspecified: Secondary | ICD-10-CM

## 2021-01-14 DIAGNOSIS — M1000 Idiopathic gout, unspecified site: Secondary | ICD-10-CM | POA: Diagnosis not present

## 2021-01-14 DIAGNOSIS — J40 Bronchitis, not specified as acute or chronic: Secondary | ICD-10-CM

## 2021-01-14 MED ORDER — AMOXICILLIN-POT CLAVULANATE 500-125 MG PO TABS
1.0000 | ORAL_TABLET | Freq: Every day | ORAL | 0 refills | Status: AC
Start: 2021-01-14 — End: 2021-01-21

## 2021-01-14 NOTE — Progress Notes (Signed)
Acute Office Visit  Subjective:    Patient ID: Brian Rose., male    DOB: Feb 15, 1941, 80 y.o.   MRN: [id number]  Chief Complaint  Patient presents with   Cough   Gout     HPI Patient is in today for gout flare follow-up and cough.  Around the weekend of November 12, patient had a gout flare to his ankles, wrist/fingers.  He was in such pain he was unable to make it to his 01/07/2021 appointment with PCP.  He had a virtual visit on 01/08/2021 and was given a prednisone burst.  Patient reports this is significantly helped his symptoms and he is about 80% back to baseline.  States he still has occasional soreness in his right third and fourth fingers with activity but otherwise all symptoms have improved.  He reports that the weekend of the flare he had missed a few days of his allopurinol and was also eating a slightly different diet due to dentition issues so this could possibly be a trigger for his flare.  He declines any further management at this time and would prefer a watch and wait method as he is improving significantly.  He would like a uric acid level checked.   Additionally patient reports he has been going on 2+ weeks of upper respiratory symptoms including productive cough with thick, yellow sputum, along with rhinorrhea, postnasal drip, sore throat.  He has not tested for COVID or flu but is out of window.  States he has done Mucinex for the past 2 days and that may be helping slightly but nothing significant.  He denies any fevers, chest pain, wheezing, dyspnea, GI/GU symptoms.    Past Medical History:  Diagnosis Date   Chronic kidney disease    Diabetes ([place])    Hypertension     No past surgical history on file.  Family History  Problem Relation Age of Onset   Diabetes Mother    Hypertension Mother    Hypertension Father    Heart attack Father 16       This is what he died from.   Diabetes Sister    Diabetes Brother    Diabetes Brother    Diabetes Sister      Social History   Socioeconomic History   Marital status: Married    Spouse name: Shirlee Limerick   Number of children: 3   Years of education: 12   Highest education level: 12th grade  Occupational History    Comment: Retired  Tobacco Use   Smoking status: Former    Packs/day: 1.00    Years: 20.00    Pack years: 20.00    Types: Cigars, Cigarettes    Quit date: 09/06/1981    Years since quitting: 39.3   Smokeless tobacco: Never  Substance and Sexual Activity   Alcohol use: No   Drug use: No   Sexual activity: Yes    Partners: Female  Other Topics Concern   Not on file  Social History Narrative   Lives with his wife. Likes to go fishing when he can.   Social Determinants of Health   Financial Resource Strain: Low Risk    Difficulty of Paying Living Expenses: Not hard at all  Food Insecurity: No Food Insecurity   Worried About Charity fundraiser in the Last Year: Never true   [place] in the Last Year: Never true  Transportation Needs: No Transportation Needs   Lack of Transportation (Medical): No  Lack of Transportation (Non-Medical): No  Physical Activity: Inactive   Days of Exercise per Week: 0 days   Minutes of Exercise per Session: 0 min  Stress: Stress Concern Present   Feeling of Stress : To some extent  Social Connections: Moderately Isolated   Frequency of Communication with Friends and Family: Three times a week   Frequency of Social Gatherings with Friends and Family: Never   Attends Religious Services: Never   Marine scientist or Organizations: No   Attends Music therapist: Never   Marital Status: Married  Human resources officer Violence: Not At Risk   Fear of Current or Ex-Partner: No   Emotionally Abused: No   Physically Abused: No   Sexually Abused: No    Outpatient Medications Prior to Visit  Medication Sig Dispense Refill   acetaminophen (TYLENOL) 500 MG tablet Take by mouth.     allopurinol (ZYLOPRIM) 100 MG tablet Take 200  mg by mouth daily.     amLODipine (NORVASC) 5 MG tablet Take 1 tablet (5 mg total) by mouth daily.     aspirin 81 MG tablet Take 81 mg by mouth 2 (two) times daily.     bisacodyl (BISACODYL LAXATIVE) 10 MG suppository Place 1 suppository (10 mg total) rectally as needed for moderate constipation. 12 suppository 0   Blood Glucose Monitoring Suppl (BAYER CONTOUR MONITOR) W/DEVICE KIT Use as needed. 1 each 0   Bromfenac Sodium (BROMSITE) 0.075 % SOLN Apply to eye.     calcitRIOL (ROCALTROL) 0.25 MCG capsule Take by mouth.     chlorhexidine (PERIDEX) 0.12 % solution SMARTSIG:0.5 Ounce(s) By Mouth Twice Daily     Cholecalciferol 50 MCG (2000 UT) TABS Take 1 tablet by mouth daily.     colchicine 0.6 MG tablet 0.3 mg (1/2 tab) no more than twice weekly during flares 30 tablet 1   Cyanocobalamin (B-12) 1000 MCG CAPS Take by mouth.     epoetin alfa-epbx (RETACRIT) 34193 UNIT/ML injection Inject into the skin. Once a month     famotidine (PEPCID) 20 MG tablet Take by mouth.     folic acid (FOLVITE) 790 MCG tablet Take by mouth.     glucose blood (PRODIGY NO CODING BLOOD GLUC) test strip 2 (two) times daily.     HYDROcodone-acetaminophen (NORCO/VICODIN) 5-325 MG tablet Take 1 tablet by mouth every 8 (eight) hours as needed for moderate pain. 15 tablet 0   lactulose (CHRONULAC) 10 GM/15ML solution Take 15 mLs (10 g total) by mouth 2 (two) times daily. 236 mL 0   lidocaine (LIDODERM) 5 % Apply one patch daily.  Remove & Discard patch after12 hours 30 patch 0   metoprolol tartrate (LOPRESSOR) 50 MG tablet Take 1 tablet (50 mg total) by mouth 2 (two) times daily. 60 tablet 11   midodrine (PROAMATINE) 2.5 MG tablet Take 1 tablet (2.5 mg total) by mouth 3 (three) times daily with meals. 30 tablet 0   mirtazapine (REMERON) 15 MG tablet Take 1 tablet (15 mg total) by mouth at bedtime. 30 tablet 3   MULTIPLE VITAMINS-MINERALS PO Take 1 tablet by mouth every morning.     Niacin (VITAMIN B-3 PO) Take by mouth.      Omega-3 Fatty Acids (FISH OIL) 1000 MG CAPS Take by mouth.     pantoprazole (PROTONIX) 40 MG tablet Take 1 tablet (40 mg total) by mouth 2 (two) times daily before a meal. 60 tablet 3   pioglitazone (ACTOS) 30 MG tablet Take 1 tablet (  30 mg total) by mouth daily. 90 tablet 3   Prodigy Lancets 28G MISC Use to check blood sugar 2 time(s) daily     traMADol (ULTRAM) 50 MG tablet Take 1-2 tablets (50-100 mg total) by mouth every 8 (eight) hours as needed for moderate pain. Maximum 6 tabs per day. 21 tablet 0   No facility-administered medications prior to visit.    No Known Allergies  Review of Systems All review of systems negative except what is listed in the HPI     Objective:    Physical Exam Vitals reviewed.  Constitutional:      Appearance: Normal appearance.  HENT:     Head: Normocephalic and atraumatic.     Right Ear: Tympanic membrane normal.     Left Ear: Tympanic membrane normal.     Nose: Congestion present.     Mouth/Throat:     Mouth: Mucous membranes are moist.     Pharynx: Oropharynx is clear. No oropharyngeal exudate or posterior oropharyngeal erythema.  Eyes:     Extraocular Movements: Extraocular movements intact.     Conjunctiva/sclera: Conjunctivae normal.  Cardiovascular:     Rate and Rhythm: Normal rate.     Pulses: Normal pulses.     Heart sounds: Normal heart sounds.  Pulmonary:     Effort: Pulmonary effort is normal.     Breath sounds: Normal breath sounds.     Comments: Productive cough Musculoskeletal:        General: No swelling or tenderness. Normal range of motion.     Cervical back: Normal range of motion and neck supple. No tenderness.  Lymphadenopathy:     Cervical: No cervical adenopathy.  Skin:    General: Skin is warm and dry.     Findings: No erythema or rash.  Neurological:     General: No focal deficit present.     Mental Status: He is alert and oriented to person, place, and time. Mental status is at baseline.  Psychiatric:         Mood and Affect: Mood normal.        Behavior: Behavior normal.        Thought Content: Thought content normal.        Judgment: Judgment normal.    BP 118/70   Pulse 99   Wt 154 lb 1.3 oz (69.9 kg)   SpO2 100%   BMI 22.75 kg/m  Wt Readings from Last 3 Encounters:  01/14/21 154 lb 1.3 oz (69.9 kg)  12/19/20 159 lb (72.1 kg)  12/10/20 158 lb (71.7 kg)    Health Maintenance Due  Topic Date Due   Zoster Vaccines- Shingrix (1 of 2) Never done   COVID-19 Vaccine (3 - Moderna risk series) 06/16/2019   FOOT EXAM  07/11/2020   INFLUENZA VACCINE  09/24/2020    There are no preventive care reminders to display for this patient.   Lab Results  Component Value Date   TSH 1.18 08/16/2020   Lab Results  Component Value Date   WBC 5.6 08/16/2020   HGB 11.6 (L) 08/16/2020   HCT 36.9 (L) 08/16/2020   MCV 85.0 08/16/2020   PLT 175 08/16/2020   Lab Results  Component Value Date   NA 136 08/16/2020   K 4.6 08/16/2020   CO2 25 08/16/2020   GLUCOSE 113 (H) 08/16/2020   BUN 62 (H) 08/16/2020   CREATININE 12.26 (H) 08/16/2020   BILITOT 0.3 08/16/2020   ALKPHOS 65 12/05/2015   AST 33 08/16/2020  ALT 19 08/16/2020   PROT 6.6 08/16/2020   ALBUMIN 3.9 12/05/2015   CALCIUM 9.2 08/16/2020   Lab Results  Component Value Date   CHOL 206 (H) 08/16/2020   Lab Results  Component Value Date   HDL 47 08/16/2020   Lab Results  Component Value Date   LDLCALC 138 (H) 08/16/2020   Lab Results  Component Value Date   TRIG 107 08/16/2020   Lab Results  Component Value Date   CHOLHDL 4.4 08/16/2020   Lab Results  Component Value Date   HGBA1C 6.0 (H) 08/16/2020       Assessment & Plan:    1. Idiopathic gout, unspecified chronicity, unspecified site Much improved. Patient does not wish for any further management at this time. Likely trigger was missing a few days of allopurinol and change to dietary habits. Prednisone helped significantly. 80% back to baseline per  patient. He would like to know his uric acid levels - educated that it would be most accurate to check about 2 weeks out from flare. Order placed, patient to come by lab later next week for blood draw. Patient aware of signs/symptoms requiring further/urgent evaluation.  - Uric acid  2. Sinobronchitis 2+ weeks of cough, postnasal drainage, URI symptoms. Given duration will go ahead and treat with ABX. Giving low dose Augmentin based on his low CrCl. Educated him to continue supportive measures: rest, hydration, humidifier use, warm liquids with honey and lemon, Mucinex, cough drops PRN, etc. Patient aware of signs/symptoms requiring further/urgent evaluation.  - amoxicillin-clavulanate (AUGMENTIN) 500-125 MG tablet; Take 1 tablet (500 mg total) by mouth daily for 7 days.  Dispense: 7 tablet; Refill: 0  Follow-up as needed. Uric acid level at his convenience next week.   Purcell Nails Olevia Bowens, DNP, FNP-C

## 2021-01-15 ENCOUNTER — Telehealth: Payer: Self-pay

## 2021-01-15 DIAGNOSIS — M1000 Idiopathic gout, unspecified site: Secondary | ICD-10-CM

## 2021-01-15 MED ORDER — PREDNISONE 10 MG (48) PO TBPK
ORAL_TABLET | Freq: Every day | ORAL | 0 refills | Status: DC
Start: 2021-01-15 — End: 2021-03-08

## 2021-01-15 NOTE — Telephone Encounter (Signed)
We will do a 12-day taper.

## 2021-01-15 NOTE — Telephone Encounter (Signed)
Patient called wanting a refill of prednisone.

## 2021-01-15 NOTE — Telephone Encounter (Signed)
Patient aware that prescription was sent in and it will be a 12 day taper. Patient verbalized understanding.

## 2021-01-22 LAB — URIC ACID: Uric Acid, Serum: 3.8 mg/dL — ABNORMAL LOW (ref 4.0–8.0)

## 2021-01-24 ENCOUNTER — Ambulatory Visit: Payer: Medicare Other | Admitting: Sports Medicine

## 2021-01-28 ENCOUNTER — Telehealth: Payer: Self-pay

## 2021-01-28 NOTE — Telephone Encounter (Signed)
Long-term use of prednisone can cause some swelling in the cheeks but this will often go away over time, my advice to him is to finish the prednisone taper, does he have any swelling elsewhere such as both lower extremities symmetrically?  Pitting edema?  We also have to keep in mind his renal insufficiency and fluid overload.

## 2021-01-28 NOTE — Telephone Encounter (Signed)
Patient called to report that he has been having bilateral cheek swelling since Thursday. He questions whether it is the prednisone. No SOB, lip or tongue swelling, difficulty speaking. Patient also denies any pain or fever. Patient states he has 2 days left of the 12 days taper course. Please advise.

## 2021-01-28 NOTE — Telephone Encounter (Signed)
Left msg for a return call from patient.  

## 2021-01-28 NOTE — Telephone Encounter (Signed)
Patient reports no more swelling that he usually has with dialysis. Advised patient to finish prednisone and facial swelling should resolve on its own after completion. Patient verbalized understanding.

## 2021-02-26 DIAGNOSIS — R079 Chest pain, unspecified: Secondary | ICD-10-CM | POA: Insufficient documentation

## 2021-02-27 DIAGNOSIS — R7989 Other specified abnormal findings of blood chemistry: Secondary | ICD-10-CM | POA: Insufficient documentation

## 2021-03-01 ENCOUNTER — Telehealth: Payer: Self-pay | Admitting: General Practice

## 2021-03-01 ENCOUNTER — Ambulatory Visit: Payer: Medicare Other | Admitting: Sports Medicine

## 2021-03-01 NOTE — Telephone Encounter (Signed)
Transition Care Management Unsuccessful Follow-up Telephone Call  Date of discharge and from where:  02/28/21 from [facility]  Attempts:  1st Attempt  Reason for unsuccessful TCM follow-up call:  Left voice message

## 2021-03-04 LAB — HEMOGLOBIN A1C: Hemoglobin A1C: 6.9

## 2021-03-04 NOTE — Telephone Encounter (Signed)
Transition Care Management Follow-up Telephone Call Date of discharge and from where: 02/28/21 from [facility] How have you been since you were released from the hospital? Doing much better. Any questions or concerns? No  Items Reviewed: Did the pt receive and understand the discharge instructions provided? Yes  Medications obtained and verified? Yes  Other? No  Any new allergies since your discharge? No  Dietary orders reviewed? Yes Do you have support at home? Yes   Home Care and Equipment/Supplies: Were home health services ordered? no  Functional Questionnaire: (I = Independent and D = Dependent) ADLs: I  Bathing/Dressing- I  Meal Prep- I  Eating- I  Maintaining continence- I  Transferring/Ambulation- I  Managing Meds- I  Follow up appointments reviewed:  PCP Hospital f/u appt confirmed? Yes  Scheduled to see Dr. Darene Lamer on 03/08/21 @ 315. [facility] f/u appt confirmed? no Are transportation arrangements needed? No  If their condition worsens, is the pt aware to call PCP or go to the Emergency Dept.? Yes Was the patient provided with contact information for the PCP's office or ED? Yes Was to pt encouraged to call back with questions or concerns? Yes

## 2021-03-06 ENCOUNTER — Ambulatory Visit: Payer: Medicare Other | Admitting: Sports Medicine

## 2021-03-08 ENCOUNTER — Other Ambulatory Visit: Payer: Self-pay

## 2021-03-08 ENCOUNTER — Encounter: Payer: Self-pay | Admitting: Sports Medicine

## 2021-03-08 ENCOUNTER — Ambulatory Visit (INDEPENDENT_AMBULATORY_CARE_PROVIDER_SITE_OTHER): Payer: Medicare Other | Admitting: Sports Medicine

## 2021-03-08 DIAGNOSIS — E119 Type 2 diabetes mellitus without complications: Secondary | ICD-10-CM

## 2021-03-08 DIAGNOSIS — I10 Essential (primary) hypertension: Secondary | ICD-10-CM

## 2021-03-08 DIAGNOSIS — I214 Non-ST elevation (NSTEMI) myocardial infarction: Secondary | ICD-10-CM

## 2021-03-08 DIAGNOSIS — K219 Gastro-esophageal reflux disease without esophagitis: Secondary | ICD-10-CM

## 2021-03-08 MED ORDER — PANTOPRAZOLE SODIUM 40 MG PO TBEC: 40.0000 mg | DELAYED_RELEASE_TABLET | Freq: Every day | ORAL | 3 refills | Status: DC

## 2021-03-08 MED ORDER — CARVEDILOL 6.25 MG PO TABS: 12.5000 mg | ORAL_TABLET | Freq: Two times a day (BID) | ORAL | 3 refills | Status: DC

## 2021-03-08 MED ORDER — AMLODIPINE BESYLATE 5 MG PO TABS: 5.0000 mg | ORAL_TABLET | Freq: Every day | ORAL | 3 refills | Status: DC

## 2021-03-08 NOTE — Assessment & Plan Note (Signed)
Brian Rose is a pleasant 81 year old male, he recalls feeling some heartburn and feeling excessively tired, went to the hospital, noted highly elevated troponins, diagnosed with MI, catheterization performed and he did get a stent. Discharged uneventfully on dual antiplatelet therapy for a year, aspirin and Brilinta. Overall feels good today. He is still having some symptoms of substernal burning particular when laying flat that likely is true heartburn/GERD, otherwise no chest pain, or excessive fatigue as he had before. He will continue with aggressive risk factor modification including blood pressure control, blood sugar control, as well as high-dose statin. Dual antiplatelet therapy for 1 year. He will keep his follow-ups with cardiology.

## 2021-03-08 NOTE — Assessment & Plan Note (Signed)
Blood pressure typically is very brittle, runs low at home, 888L to 579J systolic and he gets orthostatic so we will not make any changes to his current regimen in spite of elevated blood pressure here in the office.

## 2021-03-08 NOTE — Assessment & Plan Note (Signed)
A1c 6.9% in the hospital, recheck in 3 months.

## 2021-03-08 NOTE — Progress Notes (Signed)
Abstract results from [place] for HgbA1c.

## 2021-03-08 NOTE — Progress Notes (Addendum)
° ° °  Procedures performed today:    None.  Independent interpretation of notes and tests performed by another provider:   None.  Brief History, Exam, Impression, and Recommendations:    NSTEMI (non-ST elevated myocardial infarction) ([place]) Brian Rose is a pleasant 81 year old male, he recalls feeling some heartburn and feeling excessively tired, went to the hospital, noted highly elevated troponins, diagnosed with MI, catheterization performed and he did get a stent. Discharged uneventfully on dual antiplatelet therapy for a year, aspirin and Brilinta. Overall feels good today. He is still having some symptoms of substernal burning particular when laying flat that likely is true heartburn/GERD, otherwise no chest pain, or excessive fatigue as he had before. He will continue with aggressive risk factor modification including blood pressure control, blood sugar control, as well as high-dose statin. Dual antiplatelet therapy for 1 year. He will keep his follow-ups with cardiology.  GERD (gastroesophageal reflux disease) Still having some GERD symptoms, refilling pantoprazole.  Diabetes mellitus type 2 in nonobese A1c 6.9% in the hospital, recheck in 3 months.  Essential hypertension, benign Blood pressure typically is very brittle, runs low at home, 833X to 832N systolic and he gets orthostatic so we will not make any changes to his current regimen in spite of elevated blood pressure here in the office.    ___________________________________________ Gwen Her. Dianah Field, M.D., ABFM., CAQSM. Primary Care and [place] Instructor of [place] of [facility]

## 2021-03-08 NOTE — Assessment & Plan Note (Signed)
Still having some GERD symptoms, refilling pantoprazole.

## 2021-04-18 ENCOUNTER — Encounter: Payer: Self-pay | Admitting: Podiatry

## 2021-04-18 ENCOUNTER — Ambulatory Visit (INDEPENDENT_AMBULATORY_CARE_PROVIDER_SITE_OTHER): Payer: Medicare Other | Admitting: Podiatry

## 2021-04-18 ENCOUNTER — Other Ambulatory Visit: Payer: Self-pay

## 2021-04-18 DIAGNOSIS — E1149 Type 2 diabetes mellitus with other diabetic neurological complication: Secondary | ICD-10-CM | POA: Diagnosis not present

## 2021-04-18 DIAGNOSIS — M792 Neuralgia and neuritis, unspecified: Secondary | ICD-10-CM | POA: Diagnosis not present

## 2021-04-18 NOTE — Progress Notes (Signed)
°  Subjective:  Patient ID: Brian Rose., male    DOB: 10-25-1940,   MRN: [id number]  Chief Complaint  Patient presents with   Foot Pain    Bilateral coldness and numbness in feet    81 y.o. male presents for bilateral foot numbness and pain. Relates the feet also stay cold. Was seen almost a year ago for a similar problem but had recently had a pelvic fracture which may have been contributing to neuropathy symptoms . Denies any other pedal complaints. He is diabetic and last A1c was 6.9 Denies n/v/f/c.   Past Medical History:  Diagnosis Date   Chronic kidney disease    Diabetes ([place])    Hypertension     Objective:  Physical Exam: Vascular: DP/PT pulses 2/4 bilateral. CFT <3 seconds. Normal hair growth on digits. No edema.  Skin. No lacerations or abrasions bilateral feet. Nails1-5 bilateral thickened elongated and discolored.  Musculoskeletal: MMT 5/5 bilateral lower extremities in DF, PF, Inversion and Eversion. Deceased ROM in DF of ankle joint.  Bilateral HAV deformity noted.  Neurological: Sensation intact to light touch. Protective sensation intact   Assessment:   1. Type II diabetes mellitus with neurological manifestations ([place])   2. Neuritis      Plan:  Patient was evaluated and treated and all questions answered. -Discussed and educated patient on diabetic foot care, especially with  regards to the vascular, neurological and musculoskeletal systems.  -Stressed the importance of good glycemic control and the detriment of not  controlling glucose levels in relation to the foot. -Discussed supportive shoes at all times and checking feet regularly.  -Discussed creams to help with numbness but not many other options.  -Mechanically debrided all nails 1-5 bilateral using sterile nail nipper and filed with dremel without incident as courtesy.  -Answered all patient questions -Patient to return  in 1 year for diabetic foot check.  -Patient advised to call the office if  any problems or questions arise in the meantime.   Lorenda Peck, DPM

## 2021-04-19 ENCOUNTER — Telehealth: Payer: Self-pay | Admitting: General Practice

## 2021-04-19 NOTE — Telephone Encounter (Signed)
Transition Care Management Unsuccessful Follow-up Telephone Call  Date of discharge and from where:  04/18/21 from [facility]  Attempts:  1st Attempt  Reason for unsuccessful TCM follow-up call:  Left voice message

## 2021-04-22 NOTE — Telephone Encounter (Signed)
Transition Care Management Unsuccessful Follow-up Telephone Call  Date of discharge and from where:  04/18/21 from [facility]  Attempts:  2nd Attempt  Reason for unsuccessful TCM follow-up call:  Left voice message

## 2021-04-24 NOTE — Telephone Encounter (Signed)
Transition Care Management Unsuccessful Follow-up Telephone Call ? ?Date of discharge and from where:  04/18/21 from [facility] ? ?Attempts:  3rd Attempt ? ?Reason for unsuccessful TCM follow-up call:  Left voice message Patient has an appt 04/26/21 with PCP ? ?  ?

## 2021-04-26 ENCOUNTER — Ambulatory Visit (INDEPENDENT_AMBULATORY_CARE_PROVIDER_SITE_OTHER): Payer: Medicare Other | Admitting: Sports Medicine

## 2021-04-26 ENCOUNTER — Other Ambulatory Visit: Payer: Self-pay

## 2021-04-26 ENCOUNTER — Ambulatory Visit (INDEPENDENT_AMBULATORY_CARE_PROVIDER_SITE_OTHER): Payer: Medicare Other

## 2021-04-26 DIAGNOSIS — S2242XA Multiple fractures of ribs, left side, initial encounter for closed fracture: Secondary | ICD-10-CM | POA: Diagnosis not present

## 2021-04-26 DIAGNOSIS — S299XXA Unspecified injury of thorax, initial encounter: Secondary | ICD-10-CM | POA: Diagnosis not present

## 2021-04-26 IMAGING — DX DG RIBS W/ CHEST 3+V*L*
4 series · 5 of 5 positions shown · non-contrast
Comparison: None.

CLINICAL DATA: Injury.  Evaluate for fracture pneumothorax.

EXAM:
LEFT RIBS AND CHEST - 3+ VIEW

[chest pa]
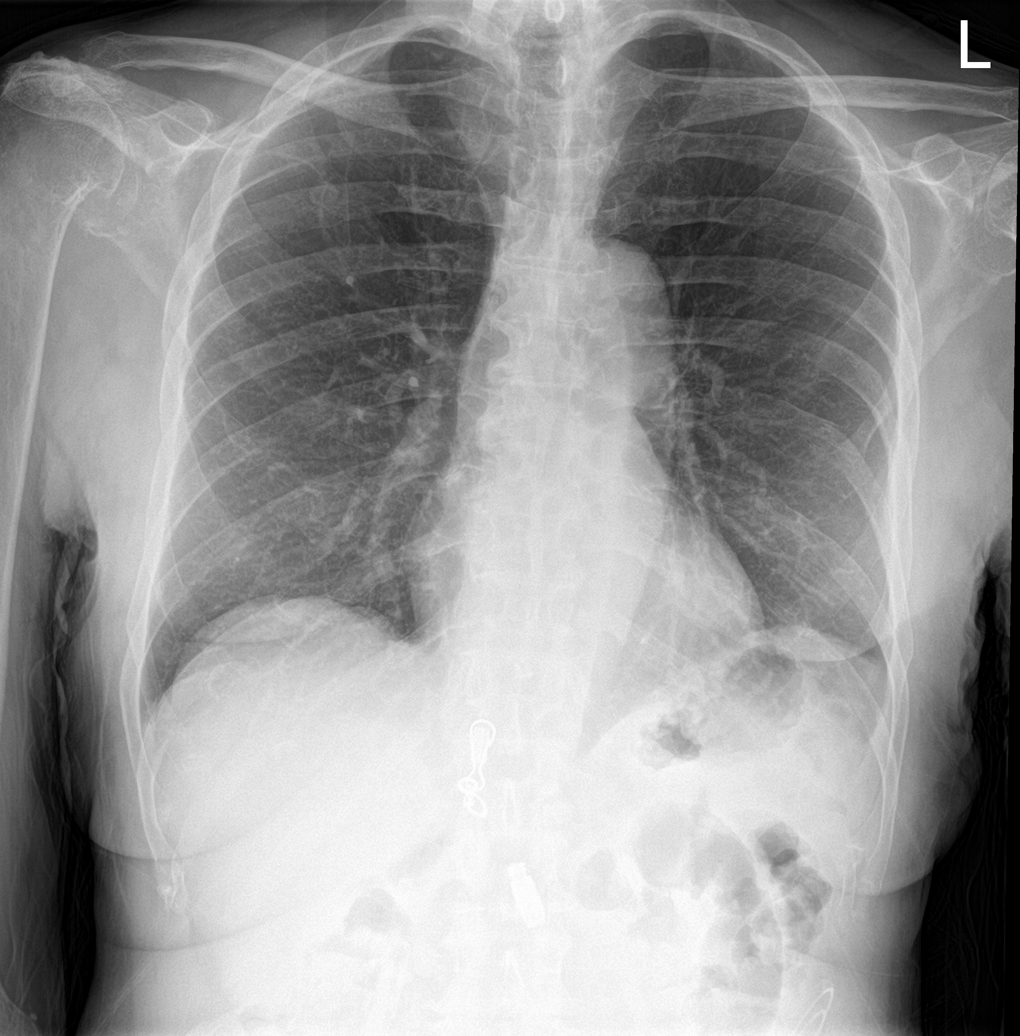

[rib pa]
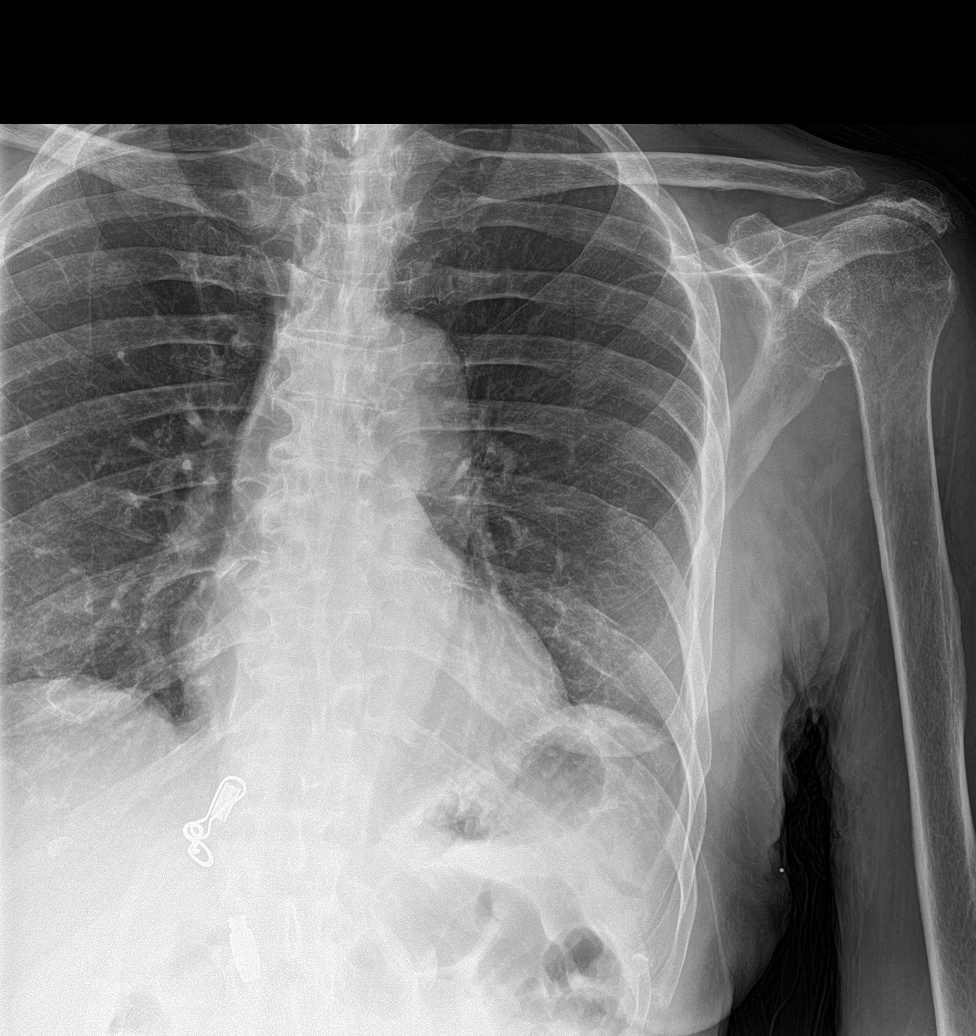

[rib ap]
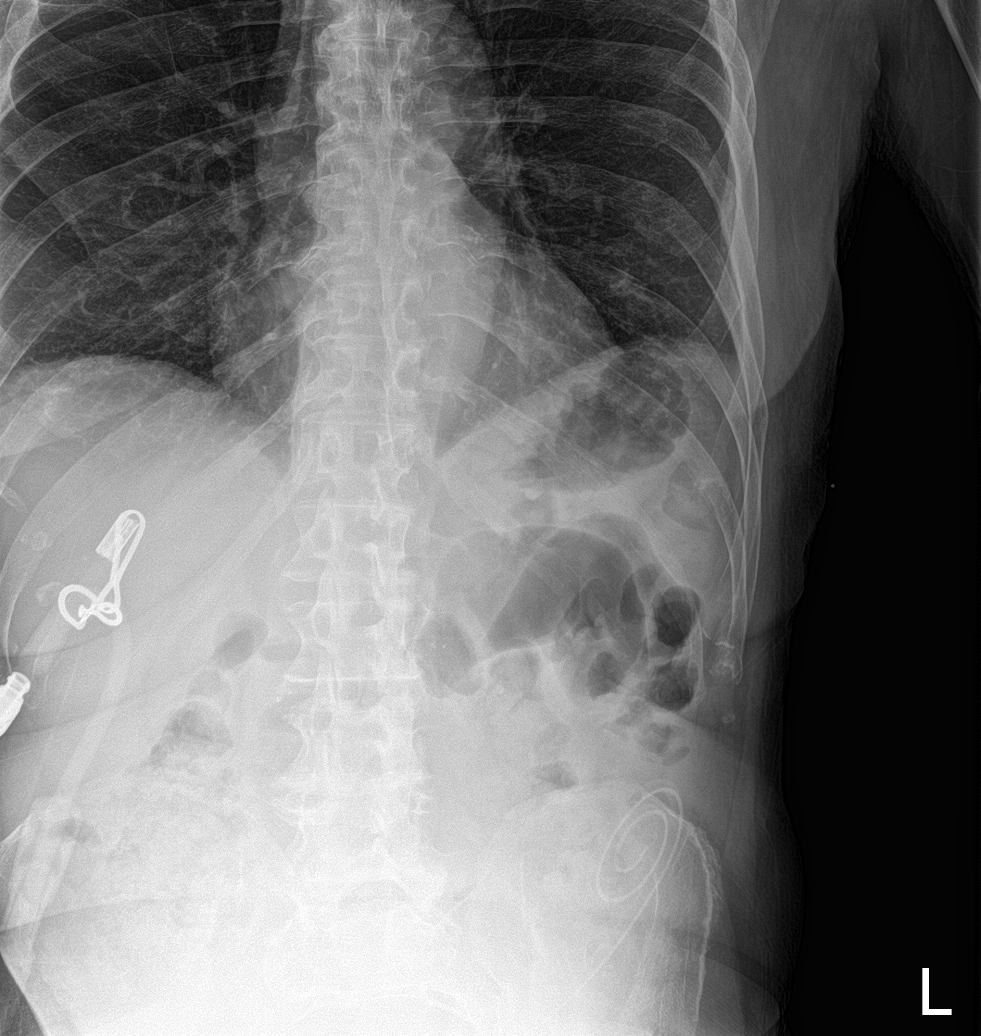

[Series 6: rib pa obl · 0.14mm/px · 2 of 2 slices shown]
[im 1/2]
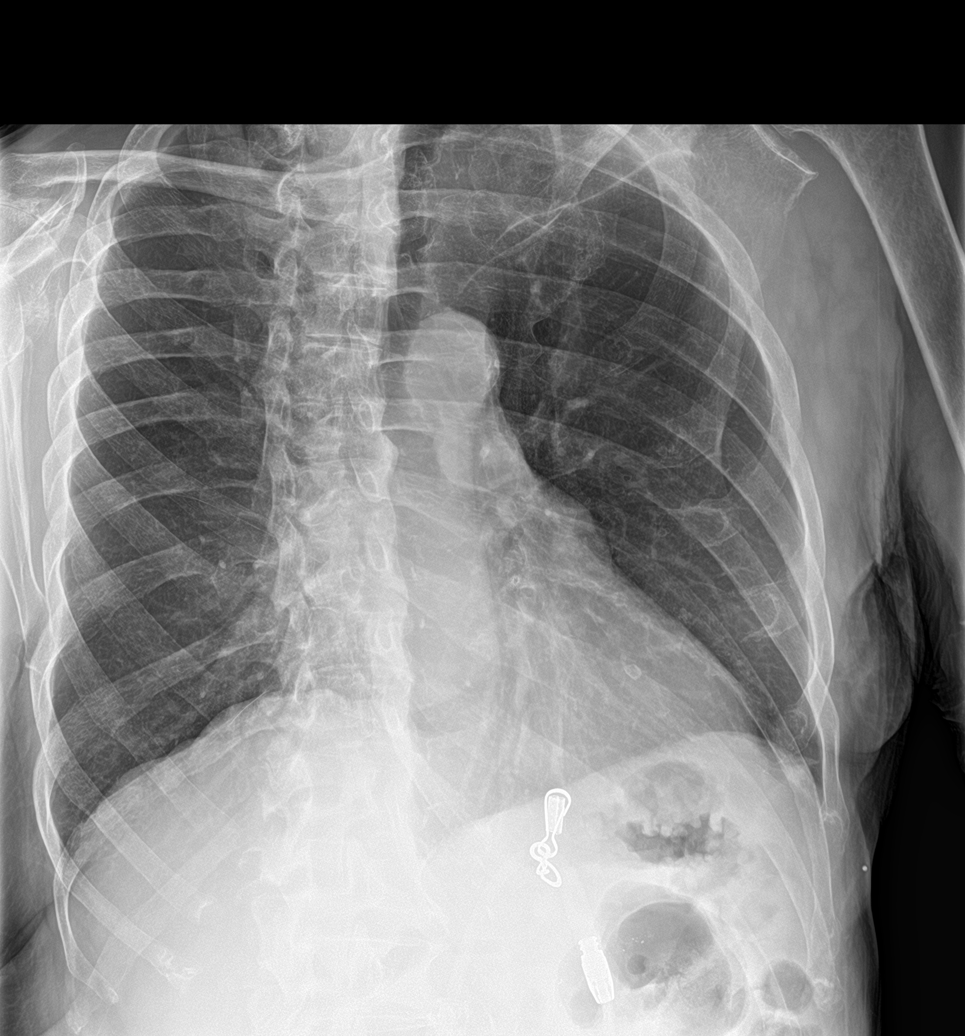
[im 2/2]
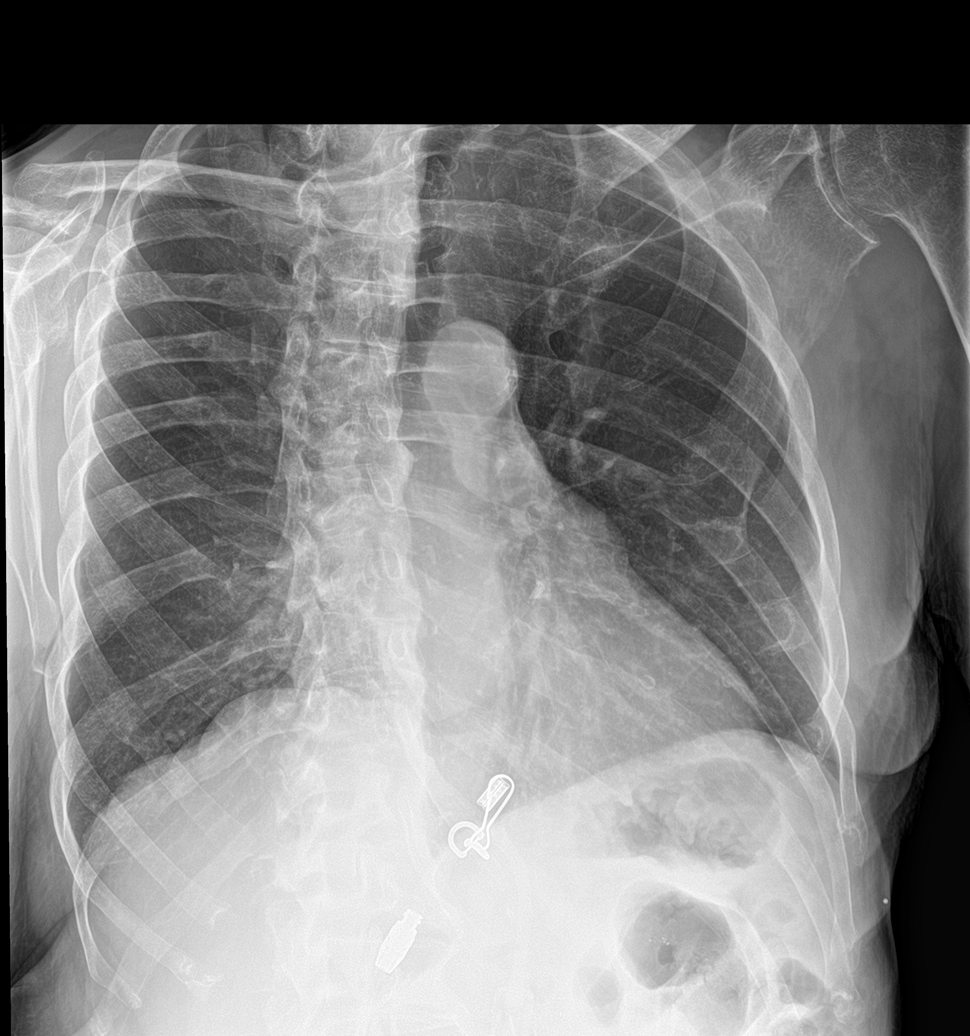

[5 of 5 positions shown; findings below may reference images not displayed]

FINDINGS: There are acute nondisplaced fractures of the posterior left eighth
and ninth rib fractures. The lungs are clear. There is no pleural
effusion or pneumothorax. Cardiomediastinal silhouette is within
normal limits. Catheter overlies the left pelvis.
IMPRESSION: 1. Acute nondisplaced fractures of the left eighth and ninth ribs.
2. No other acute cardiopulmonary process.

## 2021-04-26 NOTE — Assessment & Plan Note (Addendum)
This is a pleasant 81 year old male, took an accidental spill down the stairs, impacted left thorax, seen in the ED, x-rays were read as negative. ?Continues to have severe pain mid axillary line approximately the sixth through ninth ribs. ?Added rib belt, he does have some hydrocodone he can take, he will add Colace to prevent constipation. ?I would like another set of rib x-rays so that I can review them myself, he does have severe pain and I am concerned that there is a occult fracture. ?Return to see me 4 weeks as needed. ? ?Update: Brian Rose does have acute nondisplaced left eighth and ninth rib fractures. ?

## 2021-04-26 NOTE — Progress Notes (Addendum)
? ? ?  Procedures performed today:   ? ?The thorax was strapped with a compressive dressing. ? ?Independent interpretation of notes and tests performed by another provider:  ? ?X-rays personally reviewed, Brian Rose does have acute nondisplaced left eighth and ninth rib fractures. ? ?Brief History, Exam, Impression, and Recommendations:   ? ?Fracture of two ribs, left, closed, initial encounter ?This is a pleasant 81 year old male, took an accidental spill down the stairs, impacted left thorax, seen in the ED, x-rays were read as negative. ?Continues to have severe pain mid axillary line approximately the sixth through ninth ribs. ?Added rib belt, he does have some hydrocodone he can take, he will add Colace to prevent constipation. ?I would like another set of rib x-rays so that I can review them myself, he does have severe pain and I am concerned that there is a occult fracture. ?Return to see me 4 weeks as needed. ? ?Update: Brian Rose does have acute nondisplaced left eighth and ninth rib fractures. ? ? ? ?___________________________________________ ?Gwen Her. Dianah Field, M.D., ABFM., CAQSM. ?Primary Care and Sports Medicine ?[place] ? ?Adjunct Instructor of Family Medicine  ?[employer] ?

## 2021-06-21 ENCOUNTER — Ambulatory Visit: Payer: Medicare Other | Admitting: Sports Medicine

## 2021-07-23 ENCOUNTER — Telehealth: Payer: Self-pay | Admitting: General Practice

## 2021-07-23 NOTE — Telephone Encounter (Signed)
Transition Care Management Unsuccessful Follow-up Telephone Call  Date of discharge and from where:  07/20/21 from [facility]  Attempts:  1st Attempt  Reason for unsuccessful TCM follow-up call:  Left voice message

## 2021-07-24 NOTE — Telephone Encounter (Signed)
Transition Care Management Unsuccessful Follow-up Telephone Call  Date of discharge and from where:  07/20/21 from [facility]  Attempts:  2nd Attempt  Reason for unsuccessful TCM follow-up call:  Left voice message

## 2021-07-26 ENCOUNTER — Encounter: Payer: Self-pay | Admitting: Sports Medicine

## 2021-07-26 ENCOUNTER — Ambulatory Visit (INDEPENDENT_AMBULATORY_CARE_PROVIDER_SITE_OTHER): Payer: Medicare Other | Admitting: Sports Medicine

## 2021-07-26 DIAGNOSIS — I10 Essential (primary) hypertension: Secondary | ICD-10-CM | POA: Diagnosis not present

## 2021-07-26 DIAGNOSIS — M48061 Spinal stenosis, lumbar region without neurogenic claudication: Secondary | ICD-10-CM

## 2021-07-26 NOTE — Assessment & Plan Note (Signed)
Blood pressures have done pretty well since the addition of amlodipine, he is not orthostatic, he does have fairly brittle blood pressures, high today but usually in the 291B systolic at home per his report, I would not want to drive in any lower than that.

## 2021-07-26 NOTE — Progress Notes (Signed)
    Procedures performed today:    None.  Independent interpretation of notes and tests performed by another provider:   None.  Brief History, Exam, Impression, and Recommendations:    Essential hypertension, benign Blood pressures have done pretty well since the addition of amlodipine, he is not orthostatic, he does have fairly brittle blood pressures, high today but usually in the 939Q systolic at home per his report, I would not want to drive in any lower than that.  Lumbar spinal stenosis Brian Rose has severe spinal stenosis L3-L4, we had ordered an L3-L4 interlaminar epidural, he never got it done. He has had a couple of falls, most recent of which involved he tripped over a curb, it was more accidental than anything else, however he does have some mild foot drop when ambulating. He did not have his cane with him at the time. We discussion measures to decrease falls including carrying his cane with him at all times, gait training and strength training and physical therapy and maintaining overall vigilance. We also discussed further intervention into his lumbar spine. We will start with physical therapy, and using the cane more often, he can return to see me in 6 weeks and if still having problems we will consider an epidural.   He did want a consult with neurology, I do suspect they will want to do a nerve conduction and EMG, we will likely find some degree of peripheral neuropathy and lumbar spinal stenosis as we know already.  We will oblige.    ___________________________________________ Gwen Her. Dianah Field, M.D., ABFM., CAQSM. Primary Care and [place] Instructor of [employer] of Medicine

## 2021-07-26 NOTE — Assessment & Plan Note (Signed)
Brian Rose has severe spinal stenosis L3-L4, we had ordered an L3-L4 interlaminar epidural, he never got it done. He has had a couple of falls, most recent of which involved he tripped over a curb, it was more accidental than anything else, however he does have some mild foot drop when ambulating. He did not have his cane with him at the time. We discussion measures to decrease falls including carrying his cane with him at all times, gait training and strength training and physical therapy and maintaining overall vigilance. We also discussed further intervention into his lumbar spine. We will start with physical therapy, and using the cane more often, he can return to see me in 6 weeks and if still having problems we will consider an epidural.   He did want a consult with neurology, I do suspect they will want to do a nerve conduction and EMG, we will likely find some degree of peripheral neuropathy and lumbar spinal stenosis as we know already.  We will oblige.

## 2021-07-29 NOTE — Telephone Encounter (Signed)
Transition Care Management Follow-up Telephone Call Date of discharge and from where: 07/20/21 from [facility] How have you been since you were released from the hospital? Patient had OV with PCP on 07/26/21. Any questions or concerns? No

## 2021-07-30 ENCOUNTER — Other Ambulatory Visit: Payer: Self-pay

## 2021-07-30 DIAGNOSIS — E119 Type 2 diabetes mellitus without complications: Secondary | ICD-10-CM

## 2021-07-30 MED ORDER — PIOGLITAZONE HCL 30 MG PO TABS: 30.0000 mg | ORAL_TABLET | Freq: Every day | ORAL | 3 refills | Status: DC

## 2021-08-07 ENCOUNTER — Ambulatory Visit: Payer: Medicare Other | Attending: Sports Medicine | Admitting: Physical Therapy

## 2021-08-07 ENCOUNTER — Encounter: Payer: Self-pay | Admitting: Physical Therapy

## 2021-08-07 DIAGNOSIS — R2689 Other abnormalities of gait and mobility: Secondary | ICD-10-CM | POA: Diagnosis present

## 2021-08-07 DIAGNOSIS — M48061 Spinal stenosis, lumbar region without neurogenic claudication: Secondary | ICD-10-CM | POA: Diagnosis not present

## 2021-08-07 DIAGNOSIS — R262 Difficulty in walking, not elsewhere classified: Secondary | ICD-10-CM | POA: Insufficient documentation

## 2021-08-07 DIAGNOSIS — M6281 Muscle weakness (generalized): Secondary | ICD-10-CM | POA: Insufficient documentation

## 2021-08-07 NOTE — Patient Instructions (Signed)
Access Code: [email or web address] URL: [email or web address] Date: 08/07/2021 Prepared by: Isabelle Course  Exercises - Standing Toe Raises at Chair  - 1 x daily - 7 x weekly - 2 sets - 10 reps - Standing Heel Raise with Support  - 1 x daily - 7 x weekly - 2 sets - 10 reps - Marching with Resistance  - 1 x daily - 7 x weekly - 2 sets - 10 reps - Standing Single Leg Stance with Counter Support  - 1 x daily - 7 x weekly - 1 sets - 3 reps - 30 seconds hold - Standing March with Counter Support  - 1 x daily - 7 x weekly - 3 sets - 10 reps

## 2021-08-07 NOTE — Therapy (Signed)
[facility] [address] [place], [place], [address] Phone: [phone]   Fax:  [phone]  Physical Therapy Evaluation  Patient Details  Name: Brian Rose. MRN: [id number] Date of Birth: [birth year] Referring Provider (PT): Thekkekandam   Encounter Date: 08/07/2021 Rationale for Evaluation and Treatment Rehabilitation   PT End of Session - 08/07/21 1358     Visit Number 1    Number of Visits 12    Date for PT Re-Evaluation 09/18/21    Authorization Type Medicare    Authorization - Visit Number 1    Progress Note Due on Visit 10    PT Start Time 1749    PT Stop Time 1355    PT Time Calculation (min) 37 min    Activity Tolerance Patient tolerated treatment well    Behavior During Therapy WFL for tasks assessed/performed             Past Medical History:  Diagnosis Date   Chronic kidney disease    Diabetes ([place])    Hypertension     History reviewed. No pertinent surgical history.  There were no vitals filed for this visit.    Subjective Assessment - 08/07/21 1317     Subjective Pt states that in the past 6 months he has had multiple falls. He feels like he is "dragging his feet". He has tripped on the curb multiple times. He has also been diagnosed with spinal stenosis and states he has pain when he first wakes up in the morning but he is able to decrease pain by stretching and "moving around".    Pertinent History pelvis fracture, rib fractures    Limitations Walking    Patient Stated Goals improve balance and stop falling    Currently in Pain? No/denies                [facility] PT Assessment - 08/07/21 0001       Assessment   Medical Diagnosis spinal stenosis, multiple falls    Referring Provider (PT) Thekkekandam    Onset Date/Surgical Date 04/10/21    Next MD Visit 09/11/21    Prior Therapy yes for pelvic fracture      Precautions   Precautions Fall      Restrictions   Weight Bearing  Restrictions No      Balance Screen   Has the patient fallen in the past 6 months Yes    How many times? multiple    Has the patient had a decrease in activity level because of a fear of falling?  No    Is the patient reluctant to leave their home because of a fear of falling?  No      Prior Function   Vocation Requirements retired      Observation/Other Assessments   Focus on Therapeutic Outcomes (FOTO)  not indicated      ROM / Strength   AROM / PROM / Strength Strength      Strength   Overall Strength Comments bilat DF 3+/5, Rt knee ext 4/5, flex 4-5, Lt knee ext 4-/5, flex 4/5, bilat hip flex 4-/5      Ambulation/Gait   Gait velocity decreased      Standardized Balance Assessment   Standardized Balance Assessment Berg Balance Test;Dynamic Gait Index      Berg Balance Test   Sit to Stand Able to stand without using hands and stabilize independently    Standing Unsupported Able to stand safely 2 minutes  Sitting with Back Unsupported but Feet Supported on Floor or Stool Able to sit safely and securely 2 minutes    Stand to Sit Controls descent by using hands    Transfers Able to transfer safely, minor use of hands    Standing Unsupported with Eyes Closed Able to stand 10 seconds with supervision    Standing Unsupported with Feet Together Able to place feet together independently and stand 1 minute safely    From Standing, Reach Forward with Outstretched Arm Loses balance while trying/requires external support    From Standing Position, Pick up Object from Floor Able to pick up shoe safely and easily    From Standing Position, Turn to Look Behind Over each Shoulder Looks behind from both sides and weight shifts well    Turn 360 Degrees Able to turn 360 degrees safely in 4 seconds or less    Standing Unsupported, Alternately Place Feet on Step/Stool Able to stand independently and complete 8 steps >20 seconds    Standing Unsupported, One Foot in [employer] balance while  stepping or standing    Standing on One Leg Able to lift leg independently and hold equal to or more than 3 seconds    Total Score 43      Dynamic Gait Index   Level Surface Normal    Change in Gait Speed Mild Impairment    Gait with Horizontal Head Turns Severe Impairment    Gait with Vertical Head Turns Mild Impairment    Gait and Pivot Turn Normal    Step Over Obstacle Moderate Impairment    Step Around Obstacles Mild Impairment    Steps Mild Impairment    Total Score 15                        Objective measurements completed on examination: See above findings.       [facility] PT Treatment/Exercise - 08/07/21 0001       Exercises   Exercises Knee/Hip      Knee/Hip Exercises: Standing   Heel Raises Limitations heel/toe raises x 10    Hip Flexion Limitations marching with red TB x 10    SLS at counter x 30 sec bilat    Other Standing Knee Exercises marching with head turns x 10                     PT Education - 08/07/21 1348     Education Details PT POC and goals, HEP    Person(s) Educated Patient    Methods Explanation;Demonstration;Handout    Comprehension Returned demonstration;Verbalized understanding                 PT Long Term Goals - 08/07/21 1503       PT LONG TERM GOAL #1   Title Pt will be independent in HEP    Time 6    Period Weeks    Status New    Target Date 09/18/21      PT LONG TERM GOAL #2   Title Pt will improve Berg Balance score to >= 50/56 to demo low fall risk    Time 6    Period Weeks    Status New    Target Date 09/18/21      PT LONG TERM GOAL #3   Title Pt will improve bilat LE strength to 4+/5    Time 6    Period Weeks    Status New  Target Date 09/18/21      PT LONG TERM GOAL #4   Title Pt will improve DGI to >= 19/24 to demo decreased fall risk    Time 6    Period Weeks    Status New    Target Date 09/18/21                    Plan - 08/07/21 1400     Clinical  Impression Statement Pt is an 81 y/o male referred for multiple falls and spinal stenosis. Pt presents with impaired balance and increased risk of falls as evidenced by score of 43 on Berg and 15 on DGI. He also presents with decreased strength in bilat LEs and decreased gait speed. Pt will benefit from skilled PT to address deficits and improve functional mobility and decrease fall risk    Personal Factors and Comorbidities Age;Past/Current Experience;Comorbidity 2    Examination-Activity Limitations Stairs;Locomotion Level    Examination-Participation Restrictions Community Activity    Stability/Clinical Decision Making Evolving/Moderate complexity    Clinical Decision Making Moderate    Rehab Potential Good    PT Frequency 2x / week    PT Duration 6 weeks    PT Treatment/Interventions Aquatic Therapy;Cryotherapy;Electrical Stimulation;Iontophoresis 4mg /ml Dexamethasone;Moist Heat;Gait training;Stair training;Balance training;Therapeutic exercise;Therapeutic activities;Functional mobility training;Patient/family education;Manual techniques;Dry needling;Taping;Neuromuscular re-education    PT Next Visit Plan assess HEP, dynamic balance and gait training    PT Home Exercise Plan [id number]    [id number] and Agree with Plan of Care Patient             Patient will benefit from skilled therapeutic intervention in order to improve the following deficits and impairments:  Decreased strength, Decreased activity tolerance, Decreased knowledge of use of DME, Decreased balance, Difficulty walking  Visit Diagnosis: Balance disorder - Plan: PT plan of care cert/re-cert  Difficulty in walking, not elsewhere classified - Plan: PT plan of care cert/re-cert  Muscle weakness (generalized) - Plan: PT plan of care cert/re-cert     Problem List Patient Active Problem List   Diagnosis Date Noted   Fracture of two ribs, left, closed, initial encounter 04/26/2021   NSTEMI (non-ST elevated myocardial  infarction) ([place]) 03/08/2021   Cold left foot 12/10/2020   Right hip pain 07/17/2020   Pharyngoesophageal dysphagia 05/02/2020   Septic shock ([place]) 04/19/2020   Gram-negative bacteremia 03/29/2020   ESRD on peritoneal dialysis ([place]) 03/28/2020   GERD (gastroesophageal reflux disease) 11/16/2019   Progressive focal motor weakness 08/24/2019   Trigger thumb, right thumb 03/26/2018   Bilateral foot pain 12/03/2017   Elevated LDL cholesterol level 09/25/2017   Secondary hyperparathyroidism of renal origin ([place]) 02/58/5277   Systolic murmur 82/42/3536   Lumbar spinal stenosis 12/05/2015   Benign essential tremor 09/12/2015   Left accessory renal artery stenosis 12/18/2014   Anemia of renal disease 09/08/2013   Pseudogout 09/06/2013   Left knee pain 09/06/2013   Diabetes mellitus type 2 in nonobese ([place]) 09/06/2013   Essential hypertension, benign 09/06/2013   Annual physical exam 09/06/2013   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 MRN: [id number] Date of Birth: 1940/03/07

## 2021-08-14 ENCOUNTER — Ambulatory Visit: Payer: Medicare Other | Admitting: Physical Therapy

## 2021-08-14 DIAGNOSIS — R2689 Other abnormalities of gait and mobility: Secondary | ICD-10-CM | POA: Diagnosis not present

## 2021-08-14 DIAGNOSIS — M6281 Muscle weakness (generalized): Secondary | ICD-10-CM

## 2021-08-14 DIAGNOSIS — R262 Difficulty in walking, not elsewhere classified: Secondary | ICD-10-CM

## 2021-08-14 NOTE — Therapy (Signed)
[facility] [place], [place], [address] Phone: [phone]   Fax:  [phone]  Physical Therapy Treatment  Patient Details  Name: Brian Rose. MRN: [id number] Date of Birth: 11-22-1940 Referring Provider (PT): Thekkekandam   Encounter Date: 08/14/2021 Rationale for Evaluation and Treatment Rehabilitation   PT End of Session - 08/14/21 1510     Visit Number 2    Number of Visits 12    Date for PT Re-Evaluation 09/18/21    Authorization Type Medicare    Authorization - Visit Number 2    Progress Note Due on Visit 10    PT Start Time 4034    PT Stop Time 1502    PT Time Calculation (min) 39 min    Activity Tolerance Patient tolerated treatment well    Behavior During Therapy WFL for tasks assessed/performed             Past Medical History:  Diagnosis Date   Chronic kidney disease    Diabetes ([place])    Hypertension     No past surgical history on file.  There were no vitals filed for this visit.   Subjective Assessment - 08/14/21 1423     Subjective Pt states he feels "about the same". He states he has been performing HEP    Patient Stated Goals improve balance and stop falling    Currently in Pain? No/denies                [facility] PT Assessment - 08/14/21 0001       Assessment   Medical Diagnosis spinal stenosis, multiple falls    Referring Provider (PT) Thekkekandam    Onset Date/Surgical Date 04/10/21    Next MD Visit 09/11/21    Prior Therapy yes for pelvic fracture      Ambulation/Gait   Gait velocity 2.20ft./sec                           [facility] Adult PT Treatment/Exercise - 08/14/21 0001       Knee/Hip Exercises: Aerobic   Nustep L5 x 5 min for warm up      Knee/Hip Exercises: Standing   Heel Raises Limitations heel/toe raises x 20    Knee Flexion 20 reps    Knee Flexion Limitations 2# bilat    Hip Flexion Limitations marching with red TB 2 x 10  bilat    Hip Abduction 20 reps    Abduction Limitations 2# bilat    Hip Extension 20 reps    Extension Limitations 2# bilat    Rocker Board --   1 min each A/P and laterally   SLS 2 x 20 sec bilat with UE support    Walking with Sports Cord resisted walking all directions green TB x 8 each way    Other Standing Knee Exercises tandem stance 2 x 30 sec bilat, standing on foam with head turns x 30 sec laterally and vertically, standing on foam diagonals and chest press with 2kg med ball CGA                          PT Long Term Goals - 08/07/21 1503       PT LONG TERM GOAL #1   Title Pt will be independent in HEP    Time 6    Period Weeks    Status New    Target  Date 09/18/21      PT LONG TERM GOAL #2   Title Pt will improve Berg Balance score to >= 50/56 to demo low fall risk    Time 6    Period Weeks    Status New    Target Date 09/18/21      PT LONG TERM GOAL #3   Title Pt will improve bilat LE strength to 4+/5    Time 6    Period Weeks    Status New    Target Date 09/18/21      PT LONG TERM GOAL #4   Title Pt will improve DGI to >= 19/24 to demo decreased fall risk    Time 6    Period Weeks    Status New    Target Date 09/18/21                   Plan - 08/14/21 1511     Clinical Impression Statement Pt with good tolerance to progression of strength and balance exercises. Requires heavy UE assist with SLS and rocker board, fingertip assist with tandem stance on ground. He will continue to benefit from training balance reactions, single leg balance, dynamic gait    PT Next Visit Plan update HEP, dynamic balance, gait    PT Home Exercise Plan [id number]    Consulted and Agree with Plan of Care Patient             Patient will benefit from skilled therapeutic intervention in order to improve the following deficits and impairments:     Visit Diagnosis: Balance disorder  Difficulty in walking, not elsewhere classified  Muscle weakness  (generalized)     Problem List Patient Active Problem List   Diagnosis Date Noted   Fracture of two ribs, left, closed, initial encounter 04/26/2021   NSTEMI (non-ST elevated myocardial infarction) ([place]) 03/08/2021   Cold left foot 12/10/2020   Right hip pain 07/17/2020   Pharyngoesophageal dysphagia 05/02/2020   Septic shock ([place]) 04/19/2020   Gram-negative bacteremia 03/29/2020   ESRD on peritoneal dialysis ([place]) 03/28/2020   GERD (gastroesophageal reflux disease) 11/16/2019   Progressive focal motor weakness 08/24/2019   Trigger thumb, right thumb 03/26/2018   Bilateral foot pain 12/03/2017   Elevated LDL cholesterol level 09/25/2017   Secondary hyperparathyroidism of renal origin ([place]) 56/21/3086   Systolic murmur 57/84/6962   Lumbar spinal stenosis 12/05/2015   Benign essential tremor 09/12/2015   Left accessory renal artery stenosis 12/18/2014   Anemia of renal disease 09/08/2013   Pseudogout 09/06/2013   Left knee pain 09/06/2013   Diabetes mellitus type 2 in nonobese ([place]) 09/06/2013   Essential hypertension, benign 09/06/2013   Annual physical exam 09/06/2013   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 MRN: [id number] Date of Birth: 02-18-1941

## 2021-08-16 ENCOUNTER — Ambulatory Visit: Payer: Medicare Other | Admitting: Physical Therapy

## 2021-08-16 DIAGNOSIS — R2689 Other abnormalities of gait and mobility: Secondary | ICD-10-CM

## 2021-08-16 DIAGNOSIS — M6281 Muscle weakness (generalized): Secondary | ICD-10-CM

## 2021-08-16 DIAGNOSIS — R262 Difficulty in walking, not elsewhere classified: Secondary | ICD-10-CM

## 2021-08-20 ENCOUNTER — Ambulatory Visit: Payer: Medicare Other | Admitting: Physical Therapy

## 2021-08-23 ENCOUNTER — Ambulatory Visit: Payer: Medicare Other | Admitting: Physical Therapy

## 2021-08-28 ENCOUNTER — Ambulatory Visit: Payer: Medicare Other | Attending: Sports Medicine | Admitting: Physical Therapy

## 2021-08-28 DIAGNOSIS — R2689 Other abnormalities of gait and mobility: Secondary | ICD-10-CM | POA: Insufficient documentation

## 2021-08-28 DIAGNOSIS — R262 Difficulty in walking, not elsewhere classified: Secondary | ICD-10-CM | POA: Diagnosis present

## 2021-08-28 DIAGNOSIS — M6281 Muscle weakness (generalized): Secondary | ICD-10-CM | POA: Insufficient documentation

## 2021-08-28 NOTE — Therapy (Signed)
[place], [place], [address] Phone: [phone]   Fax:  [phone]  Physical Therapy Treatment  Patient Details  Name: Brian Rose. MRN: [id number] Date of Birth: Dec 08, 1940 Referring Provider (PT): Thekkekandam   Encounter Date: 08/28/2021 Rationale for Evaluation and Treatment Rehabilitation   PT End of Session - 08/28/21 1443     Visit Number 4    Number of Visits 12    Date for PT Re-Evaluation 09/18/21    Authorization - Visit Number 4    Progress Note Due on Visit 10    PT Start Time 1400    PT Stop Time 1442    PT Time Calculation (min) 42 min    Activity Tolerance Patient tolerated treatment well    Behavior During Therapy WFL for tasks assessed/performed             Past Medical History:  Diagnosis Date   Chronic kidney disease    Diabetes ([place])    Hypertension     No past surgical history on file.  There were no vitals filed for this visit.   Subjective Assessment - 08/28/21 1400     Subjective Pt states he is getting a second opinion about his back - he thinks he might need to have surgery. He is better able to move his leg today vs last session. Pt with no c/o back pain    Patient Stated Goals improve balance and stop falling    Currently in Pain? No/denies                [facility] PT Assessment - 08/28/21 0001       Ambulation/Gait   Gait velocity 2.17 sec      Berg Balance Test   Sit to Stand Able to stand without using hands and stabilize independently    Standing Unsupported Able to stand safely 2 minutes    Sitting with Back Unsupported but Feet Supported on Floor or Stool Able to sit safely and securely 2 minutes    Stand to Sit Sits safely with minimal use of hands    Transfers Able to transfer safely, minor use of hands    Standing Unsupported with Eyes Closed Able to stand 10 seconds safely    Standing Unsupported with Feet Together Able to  place feet together independently and stand 1 minute safely    From Standing, Reach Forward with Outstretched Arm Can reach forward >5 cm safely (2")    From Standing Position, Pick up Object from Floor Able to pick up shoe safely and easily    From Standing Position, Turn to Look Behind Over each Shoulder Looks behind from both sides and weight shifts well    Turn 360 Degrees Able to turn 360 degrees safely one side only in 4 seconds or less    Standing Unsupported, Alternately Place Feet on Step/Stool Able to stand independently and complete 8 steps >20 seconds    Standing Unsupported, One Foot in [employer] balance while stepping or standing    Standing on One Leg Able to lift leg independently and hold equal to or more than 3 seconds    Total Score 46                           [facility] Adult PT Treatment/Exercise - 08/28/21 0001       Knee/Hip Exercises: Aerobic   Nustep L5 x 5 min  for warm up      Knee/Hip Exercises: Standing   Heel Raises Limitations heel/toe raises x 20    Lateral Step Up Both;10 reps;Hand Hold: 1;Step Height: 4"    Forward Step Up Right;Left;10 reps;Hand Hold: 1;Step Height: 6"    Other Standing Knee Exercises tandem stance on foam 2 x 30 sec bilat, standing on foam with head turns 2 x 30 sec each horizontally and vertically, EC on foam x 30 sec CGA    Other Standing Knee Exercises coordination box step wiht 1 UE support with cues for lifting feet especially with backward stepping                          PT Long Term Goals - 08/07/21 1503       PT LONG TERM GOAL #1   Title Pt will be independent in HEP    Time 6    Period Weeks    Status New    Target Date 09/18/21      PT LONG TERM GOAL #2   Title Pt will improve Berg Balance score to >= 50/56 to demo low fall risk    Time 6    Period Weeks    Status New    Target Date 09/18/21      PT LONG TERM GOAL #3   Title Pt will improve bilat LE strength to 4+/5    Time 6     Period Weeks    Status New    Target Date 09/18/21      PT LONG TERM GOAL #4   Title Pt will improve DGI to >= 19/24 to demo decreased fall risk    Time 6    Period Weeks    Status New    Target Date 09/18/21                   Plan - 08/28/21 1444     Clinical Impression Statement Pt improved Berg balance score to 46/56, decreased risk for falls. Gait speed is unchanged. Pt still with difficulty with picking up his feet when fatigued and when distracted during gait. Pt educated on importance of continuing to work on LE strength and endurance    PT Next Visit Plan update HEP, dynamic balance, gait    PT Home Exercise Plan [id number]    Consulted and Agree with Plan of Care Patient             Patient will benefit from skilled therapeutic intervention in order to improve the following deficits and impairments:     Visit Diagnosis: Balance disorder  Difficulty in walking, not elsewhere classified  Muscle weakness (generalized)     Problem List Patient Active Problem List   Diagnosis Date Noted   Fracture of two ribs, left, closed, initial encounter 04/26/2021   NSTEMI (non-ST elevated myocardial infarction) ([place]) 03/08/2021   Cold left foot 12/10/2020   Right hip pain 07/17/2020   Pharyngoesophageal dysphagia 05/02/2020   Septic shock ([place]) 04/19/2020   Gram-negative bacteremia 03/29/2020   ESRD on peritoneal dialysis ([place]) 03/28/2020   GERD (gastroesophageal reflux disease) 11/16/2019   Progressive focal motor weakness 08/24/2019   Trigger thumb, right thumb 03/26/2018   Bilateral foot pain 12/03/2017   Elevated LDL cholesterol level 09/25/2017   Secondary hyperparathyroidism of renal origin ([place]) 94/76/5465   Systolic murmur 03/54/6568   Lumbar spinal stenosis 12/05/2015   Benign essential tremor 09/12/2015   Left accessory  renal artery stenosis 12/18/2014   Anemia of renal disease 09/08/2013   Pseudogout 09/06/2013   Left knee pain 09/06/2013    Diabetes mellitus type 2 in nonobese ([place]) 09/06/2013   Essential hypertension, benign 09/06/2013   Annual physical exam 09/06/2013   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 MRN: [id number] Date of Birth: 01/13/1941

## 2021-08-29 ENCOUNTER — Other Ambulatory Visit: Payer: Self-pay | Admitting: Specialist

## 2021-08-29 DIAGNOSIS — M5417 Radiculopathy, lumbosacral region: Secondary | ICD-10-CM

## 2021-08-30 ENCOUNTER — Ambulatory Visit: Payer: Medicare Other | Admitting: Physical Therapy

## 2021-08-30 DIAGNOSIS — M6281 Muscle weakness (generalized): Secondary | ICD-10-CM

## 2021-08-30 DIAGNOSIS — R262 Difficulty in walking, not elsewhere classified: Secondary | ICD-10-CM

## 2021-08-30 DIAGNOSIS — R2689 Other abnormalities of gait and mobility: Secondary | ICD-10-CM

## 2021-08-30 NOTE — Patient Instructions (Signed)
Access Code: [email or web address] URL: [email or web address] Date: 08/30/2021 Prepared by: Isabelle Course  Exercises - Standing Toe Raises at Chair  - 1 x daily - 7 x weekly - 2 sets - 10 reps - Standing Heel Raise with Support  - 1 x daily - 7 x weekly - 2 sets - 10 reps - Standing Single Leg Stance with Counter Support  - 1 x daily - 7 x weekly - 1 sets - 3 reps - 30 seconds hold - Forward Step Up  - 1 x daily - 7 x weekly - 2 sets - 10 reps - Lateral Step Up with Counter Support  - 1 x daily - 7 x weekly - 2 sets - 10 reps - Tandem Stance with Support  - 1 x daily - 7 x weekly - 2 sets - 10 reps - Standing with Head Rotation  - 1 x daily - 7 x weekly - 2 sets - 10 reps

## 2021-08-30 NOTE — Therapy (Signed)
[facility] [place], [place], [address] Phone: [phone]   Fax:  [phone]  Physical Therapy Treatment and Discharge  Patient Details  Name: Brian Rose. MRN: [id number] Date of Birth: 1941/02/15 Referring Provider (PT): Thekkekandam   Encounter Date: 08/30/2021 Rationale for Evaluation and Treatment Rehabilitation   PT End of Session - 08/30/21 1147     Visit Number 5    Number of Visits 12    Date for PT Re-Evaluation 09/18/21    Authorization Type Medicare    Authorization - Visit Number 5    Progress Note Due on Visit 10    PT Start Time 1100    PT Stop Time 1139    PT Time Calculation (min) 39 min    Activity Tolerance Patient tolerated treatment well    Behavior During Therapy WFL for tasks assessed/performed             Past Medical History:  Diagnosis Date   Chronic kidney disease    Diabetes ([place])    Hypertension     No past surgical history on file.  There were no vitals filed for this visit.   Subjective Assessment - 08/30/21 1058     Subjective Pt states he feels "about the same". His leg continues to feel better    Patient Stated Goals improve balance and stop falling    Currently in Pain? No/denies                [facility] PT Assessment - 08/30/21 0001       Functional Tests   Functional tests --   unable to perform SLS wihtout UE support     Dynamic Gait Index   Level Surface Normal    Change in Gait Speed Mild Impairment    Gait with Horizontal Head Turns Moderate Impairment    Gait with Vertical Head Turns Mild Impairment    Gait and Pivot Turn Mild Impairment    Step Over Obstacle Mild Impairment    Step Around Obstacles Normal    Steps Mild Impairment    Total Score 17                           [facility] Adult PT Treatment/Exercise - 08/30/21 0001       Knee/Hip Exercises: Aerobic   Nustep L5 x 5 min for warm up      Knee/Hip  Exercises: Standing   Heel Raises Limitations heel/toe raises x 20    Lateral Step Up Both;10 reps;Hand Hold: 1;Step Height: 4"    Forward Step Up Right;Left;10 reps;Hand Hold: 1;Step Height: 6";2 sets    Walking with Sports Cord resisted walking all directions green TB x 8    Other Standing Knee Exercises tandem stance on foam 2 x 30 sec bilat, standing on foam with head turns 2 x 30 sec each horizontally and vertically, EC on foam x 30 sec CGA                     PT Education - 08/30/21 1147     Education Details updated HEP    Person(s) Educated Patient    Methods Explanation;Demonstration;Handout    Comprehension Returned demonstration;Verbalized understanding                 PT Long Term Goals - 08/30/21 1147       PT LONG TERM GOAL #1  Title Pt will be independent in HEP    Status Achieved      PT LONG TERM GOAL #2   Title Pt will improve Berg Balance score to >= 50/56 to demo low fall risk    Status Partially Met      PT LONG TERM GOAL #3   Title Pt will improve bilat LE strength to 4+/5    Status Partially Met      PT LONG TERM GOAL #4   Title Pt will improve DGI to >= 19/24 to demo decreased fall risk    Status Partially Met                   Plan - 08/30/21 1148     Clinical Impression Statement Pt has improved Berg balance and DGI scores but remains at a fall risk. His gait speed remains unchanged. He is independent with HEP and is improving balance and functional mobility. Pt feels confident to d/c to HEP    PT Next Visit Plan d/c    PT Home Exercise Plan [id number]    Consulted and Agree with Plan of Care Patient             Patient will benefit from skilled therapeutic intervention in order to improve the following deficits and impairments:     Visit Diagnosis: Balance disorder  Difficulty in walking, not elsewhere classified  Muscle weakness (generalized)     Problem List Patient Active Problem List   Diagnosis  Date Noted   Fracture of two ribs, left, closed, initial encounter 04/26/2021   NSTEMI (non-ST elevated myocardial infarction) ([place]) 03/08/2021   Cold left foot 12/10/2020   Right hip pain 07/17/2020   Pharyngoesophageal dysphagia 05/02/2020   Septic shock ([place]) 04/19/2020   Gram-negative bacteremia 03/29/2020   ESRD on peritoneal dialysis ([place]) 03/28/2020   GERD (gastroesophageal reflux disease) 11/16/2019   Progressive focal motor weakness 08/24/2019   Trigger thumb, right thumb 03/26/2018   Bilateral foot pain 12/03/2017   Elevated LDL cholesterol level 09/25/2017   Secondary hyperparathyroidism of renal origin ([place]) 11/88/6773   Systolic murmur 73/66/8159   Lumbar spinal stenosis 12/05/2015   Benign essential tremor 09/12/2015   Left accessory renal artery stenosis 12/18/2014   Anemia of renal disease 09/08/2013   Pseudogout 09/06/2013   Left knee pain 09/06/2013   Diabetes mellitus type 2 in nonobese ([place]) 09/06/2013   Essential hypertension, benign 09/06/2013   Annual physical exam 09/06/2013   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 Patient goals were partially met. Patient is being discharged due to being pleased with the current functional level.  Tenya Araque, PT 08/30/2021, 11:49 AM  [facility] [place], [place], [address] Phone: [phone]   Fax:  [phone]  Name: Brian Rose. MRN: [id number] Date of Birth: 01-Jul-1940

## 2021-09-06 ENCOUNTER — Encounter: Payer: Self-pay | Admitting: Sports Medicine

## 2021-09-06 ENCOUNTER — Ambulatory Visit (INDEPENDENT_AMBULATORY_CARE_PROVIDER_SITE_OTHER): Payer: Medicare Other | Admitting: Sports Medicine

## 2021-09-06 DIAGNOSIS — E119 Type 2 diabetes mellitus without complications: Secondary | ICD-10-CM

## 2021-09-06 DIAGNOSIS — M48061 Spinal stenosis, lumbar region without neurogenic claudication: Secondary | ICD-10-CM

## 2021-09-06 DIAGNOSIS — G4701 Insomnia due to medical condition: Secondary | ICD-10-CM

## 2021-09-06 DIAGNOSIS — G4700 Insomnia, unspecified: Secondary | ICD-10-CM | POA: Insufficient documentation

## 2021-09-06 MED ORDER — PIOGLITAZONE HCL 30 MG PO TABS: 30.0000 mg | ORAL_TABLET | Freq: Every day | ORAL | 3 refills | Status: DC

## 2021-09-06 MED ORDER — ZOLPIDEM TARTRATE 5 MG PO TABS: 5.0000 mg | ORAL_TABLET | Freq: Every evening | ORAL | 3 refills | Status: DC | PRN

## 2021-09-06 NOTE — Assessment & Plan Note (Signed)
Refilling Actos.

## 2021-09-06 NOTE — Assessment & Plan Note (Signed)
This pleasant 81 year old male does have some insomnia, he is on peritoneal dialysis, also has to sit upright at night due to GERD. He has tried melatonin without much improvement. He does not look at bright screens, barely has to get up to void considering dialysis. We will start low-dose Ambien, there is no dialysis adjustment.

## 2021-09-06 NOTE — Progress Notes (Signed)
    Procedures performed today:    None.  Independent interpretation of notes and tests performed by another provider:   None.  Brief History, Exam, Impression, and Recommendations:    Lumbar spinal stenosis Lumbar spinal stenosis, had an epidural with [facility] and doing much better. Return as needed for this.  Diabetes mellitus type 2 in nonobese Refilling Actos.  Insomnia This pleasant 81 year old male does have some insomnia, he is on peritoneal dialysis, also has to sit upright at night due to GERD. He has tried melatonin without much improvement. He does not look at bright screens, barely has to get up to void considering dialysis. We will start low-dose Ambien, there is no dialysis adjustment.    ____________________________________________ Gwen Her. Dianah Field, M.D., ABFM., CAQSM., AME. Primary Care and Sports Medicine [facility]  Adjunct Professor of [place] [facility]  Risk manager

## 2021-09-06 NOTE — Assessment & Plan Note (Signed)
Lumbar spinal stenosis, had an epidural with [facility] and doing much better. Return as needed for this.

## 2021-09-17 ENCOUNTER — Other Ambulatory Visit: Payer: Self-pay

## 2021-09-17 DIAGNOSIS — I214 Non-ST elevation (NSTEMI) myocardial infarction: Secondary | ICD-10-CM

## 2021-09-17 MED ORDER — CARVEDILOL 6.25 MG PO TABS: 12.5000 mg | ORAL_TABLET | Freq: Two times a day (BID) | ORAL | 3 refills | Status: DC

## 2021-09-30 ENCOUNTER — Ambulatory Visit (INDEPENDENT_AMBULATORY_CARE_PROVIDER_SITE_OTHER): Payer: Medicare Other

## 2021-09-30 DIAGNOSIS — M545 Low back pain, unspecified: Secondary | ICD-10-CM

## 2021-09-30 DIAGNOSIS — M5417 Radiculopathy, lumbosacral region: Secondary | ICD-10-CM

## 2021-09-30 DIAGNOSIS — G8929 Other chronic pain: Secondary | ICD-10-CM | POA: Diagnosis not present

## 2021-10-16 ENCOUNTER — Encounter: Payer: Self-pay | Admitting: General Practice

## 2021-11-01 ENCOUNTER — Encounter: Payer: Self-pay | Admitting: Sports Medicine

## 2021-11-01 ENCOUNTER — Ambulatory Visit (INDEPENDENT_AMBULATORY_CARE_PROVIDER_SITE_OTHER): Payer: Medicare Other | Admitting: Sports Medicine

## 2021-11-01 DIAGNOSIS — I10 Essential (primary) hypertension: Secondary | ICD-10-CM | POA: Diagnosis not present

## 2021-11-01 DIAGNOSIS — I214 Non-ST elevation (NSTEMI) myocardial infarction: Secondary | ICD-10-CM | POA: Diagnosis not present

## 2021-11-01 MED ORDER — AMLODIPINE BESYLATE 5 MG PO TABS: 5.0000 mg | ORAL_TABLET | Freq: Every day | ORAL | 3 refills | Status: DC

## 2021-11-01 NOTE — Assessment & Plan Note (Signed)
Brian Rose returns, he was doing really well historically, blood pressures were in the 785Y systolic, he did run out of amlodipine. Blood pressures in the 850Y systolic today, no symptoms. Refilling amlodipine 5 mg daily, he will be sure to discontinue all midodrine, continue carvedilol at current dose of 6.25 mg daily with the option of doubling the dose if needed. He will MyChart me over the next couple of weeks with his blood pressures.

## 2021-11-01 NOTE — Progress Notes (Signed)
    Procedures performed today:    None.  Independent interpretation of notes and tests performed by another provider:   None.  Brief History, Exam, Impression, and Recommendations:    Essential hypertension, benign Bertis returns, he was doing really well historically, blood pressures were in the 284X systolic, he did run out of amlodipine. Blood pressures in the 324M systolic today, no symptoms. Refilling amlodipine 5 mg daily, he will be sure to discontinue all midodrine, continue carvedilol at current dose of 6.25 mg daily with the option of doubling the dose if needed. He will MyChart me over the next couple of weeks with his blood pressures.  Chronic process with exacerbation and pharmacologic intervention  ____________________________________________ Gwen Her. Dianah Field, M.D., ABFM., CAQSM., AME. Primary Care and Sports Medicine [facility]  Adjunct Professor of [place] [employer] [facility] of Medicine  Risk manager

## 2021-11-01 NOTE — Addendum Note (Signed)
Addended by: Dema Severin on: 11/01/2021 03:22 PM   Modules accepted: Orders

## 2021-11-08 ENCOUNTER — Ambulatory Visit: Payer: Medicare Other | Admitting: Podiatry

## 2021-11-11 ENCOUNTER — Encounter (INDEPENDENT_AMBULATORY_CARE_PROVIDER_SITE_OTHER): Payer: Medicare Other | Admitting: Sports Medicine

## 2021-11-11 DIAGNOSIS — I10 Essential (primary) hypertension: Secondary | ICD-10-CM

## 2021-11-11 DIAGNOSIS — I214 Non-ST elevation (NSTEMI) myocardial infarction: Secondary | ICD-10-CM

## 2021-11-11 MED ORDER — AMLODIPINE BESYLATE 10 MG PO TABS: 10.0000 mg | ORAL_TABLET | Freq: Every day | ORAL | 11 refills | Status: DC

## 2021-11-11 MED ORDER — CARVEDILOL 12.5 MG PO TABS: 12.5000 mg | ORAL_TABLET | Freq: Two times a day (BID) | ORAL | 3 refills | Status: DC

## 2021-11-11 NOTE — Addendum Note (Signed)
Addended by: Silverio Decamp on: 11/11/2021 10:55 AM   Modules accepted: Orders

## 2021-11-11 NOTE — Telephone Encounter (Signed)
I spent 5 total minutes of online digital evaluation and management services in this patient-initiated request for online care. 

## 2021-11-11 NOTE — Assessment & Plan Note (Addendum)
Brian Rose's blood pressures at home have continued to run generally too high, he is taking amlodipine 5 mg daily and updates me that he is taking carvedilol 25 mg twice daily, increasing amlodipine to 10 mg daily with a 2-week blood pressure recheck, he can send me the numbers in my chart.

## 2021-11-18 ENCOUNTER — Encounter: Payer: Self-pay | Admitting: Sports Medicine

## 2021-11-21 ENCOUNTER — Ambulatory Visit: Payer: Medicare Other | Admitting: Podiatry

## 2022-01-22 ENCOUNTER — Encounter: Payer: Self-pay | Admitting: Family Medicine

## 2022-01-22 ENCOUNTER — Ambulatory Visit (INDEPENDENT_AMBULATORY_CARE_PROVIDER_SITE_OTHER): Payer: Medicare Other | Admitting: Family Medicine

## 2022-01-22 VITALS — BP 150/66 | HR 78 | Temp 97.7°F | Ht 69.0 in | Wt 163.5 lb

## 2022-01-22 DIAGNOSIS — J0110 Acute frontal sinusitis, unspecified: Secondary | ICD-10-CM

## 2022-01-22 MED ORDER — SALINE SPRAY 0.65 % NA SOLN: 1.0000 | NASAL | 0 refills | Status: AC | PRN

## 2022-01-22 MED ORDER — AZITHROMYCIN 250 MG PO TABS: ORAL_TABLET | ORAL | 0 refills | Status: AC

## 2022-01-22 MED ORDER — FLUTICASONE PROPIONATE 50 MCG/ACT NA SUSP: 1.0000 | Freq: Every day | NASAL | 0 refills | Status: DC

## 2022-01-22 NOTE — Progress Notes (Signed)
Acute Office Visit  Subjective:     Patient ID: Brian Bur., male    DOB: [birth year], 81 y.o.   MRN: [id number]  Chief Complaint  Patient presents with   Nasal Congestion    Patient in office c/o sinus pressure and nasal congestion - occasionally producing thick dark brown mucus x around one week.  Started with a  cough that has now resolved.     HPI Patient is in today for acute visit.  Pt reports sinus pressure, cough at first but now resolved, nasal congestion. Denies fever or sore throat. Tried OTC sinus medicines that helped some. Symptoms worse at night. No SOB.   Review of Systems  Constitutional:  Negative for fever and weight loss.  HENT:  Positive for congestion and sinus pain. Negative for ear pain, nosebleeds and sore throat.   Eyes:  Negative for double vision.  Respiratory:  Positive for cough. Negative for shortness of breath and wheezing.   Cardiovascular:  Negative for chest pain.  Skin:  Negative for rash.  Neurological:  Negative for dizziness and headaches.        Objective:    BP (!) 150/66   Pulse 78   Temp 97.7 F (36.5 C)   Ht 5\' 9"  (1.753 m)   Wt 163 lb 8 oz (74.2 kg)   SpO2 100%   BMI 24.14 kg/m    Physical Exam Constitutional:      Appearance: Normal appearance. He is normal weight.  HENT:     Head: Normocephalic and atraumatic.     Right Ear: Tympanic membrane, ear canal and external ear normal.     Left Ear: Tympanic membrane, ear canal and external ear normal.     Nose: Congestion and rhinorrhea present.     Mouth/Throat:     Mouth: Mucous membranes are moist.     Pharynx: Oropharynx is clear.  Eyes:     Conjunctiva/sclera: Conjunctivae normal.     Pupils: Pupils are equal, round, and reactive to light.  Cardiovascular:     Rate and Rhythm: Normal rate and regular rhythm.     Pulses: Normal pulses.     Heart sounds: Normal heart sounds.  Abdominal:     General: Abdomen is flat. Bowel sounds are normal.  Skin:     General: Skin is warm.     Capillary Refill: Capillary refill takes less than 2 seconds.  Neurological:     General: No focal deficit present.     Mental Status: He is alert. Mental status is at baseline.  Psychiatric:        Mood and Affect: Mood normal.        Behavior: Behavior normal.        Thought Content: Thought content normal.        Judgment: Judgment normal.    No results found for any visits on 01/22/22.      Assessment & Plan:   Problem List Items Addressed This Visit   None Visit Diagnoses     Acute non-recurrent frontal sinusitis    -  Primary   Relevant Medications   sodium chloride (OCEAN) 0.65 % SOLN nasal spray   azithromycin (ZITHROMAX) 250 MG tablet   fluticasone (FLONASE) 50 MCG/ACT nasal spray       Meds ordered this encounter  Medications   sodium chloride (OCEAN) 0.65 % SOLN nasal spray    Sig: Place 1 spray into both nostrils as needed for congestion.    Dispense:  30 mL    Refill:  0   azithromycin (ZITHROMAX) 250 MG tablet    Sig: Take 2 tablets on day 1, then 1 tablet daily on days 2 through 5    Dispense:  6 tablet    Refill:  0   fluticasone (FLONASE) 50 MCG/ACT nasal spray    Sig: Place 1 spray into both nostrils daily.    Dispense:  16 g    Refill:  0  Treat with ABX, nasal saline spray and flonase for suspected sinusitis. Follow up prn if no better  No follow-ups on file.  Leeanne Rio, MD

## 2022-03-07 ENCOUNTER — Other Ambulatory Visit: Payer: Self-pay | Admitting: Family Medicine

## 2022-03-07 DIAGNOSIS — J0110 Acute frontal sinusitis, unspecified: Secondary | ICD-10-CM

## 2022-03-11 ENCOUNTER — Encounter: Payer: Self-pay | Admitting: Sports Medicine

## 2022-03-11 ENCOUNTER — Ambulatory Visit (INDEPENDENT_AMBULATORY_CARE_PROVIDER_SITE_OTHER): Payer: Medicare Other | Admitting: Sports Medicine

## 2022-03-11 DIAGNOSIS — K219 Gastro-esophageal reflux disease without esophagitis: Secondary | ICD-10-CM | POA: Diagnosis not present

## 2022-03-11 MED ORDER — PANTOPRAZOLE SODIUM 40 MG PO TBEC: 40.0000 mg | DELAYED_RELEASE_TABLET | Freq: Two times a day (BID) | ORAL | 3 refills | Status: DC

## 2022-03-11 MED ORDER — SUCRALFATE 1 G PO TABS: 1.0000 g | ORAL_TABLET | Freq: Four times a day (QID) | ORAL | 0 refills | Status: DC

## 2022-03-11 NOTE — Progress Notes (Signed)
    Procedures performed today:    None.  Independent interpretation of notes and tests performed by another provider:   None.  Brief History, Exam, Impression, and Recommendations:    GERD (gastroesophageal reflux disease) This is a very pleasant 82 year old male, he has a history of GERD, historically was on pantoprazole, he stopped this sometime ago and has just been using over-the-counter Prilosec. For the past several months has had increasing midepigastric pain with a burning sensation substernal worse when eating any food but significantly worse with spicy foods. He denies any bloody stools, he denies any hematemesis, no dark or tarry stools. Exam today is benign, no tenderness to palpation. We will discontinue his Prilosec and switch to pantoprazole 40 mg twice daily, Carafate. Avoid eating within 2 to 3 hours of bedtime. I would like to see him back in 6 weeks and if persistent discomfort I would like him to return to digestive health specialist for upper endoscopy.  Chronic process with exacerbation and pharmacologic intervention  ____________________________________________ Gwen Her. Dianah Field, M.D., ABFM., CAQSM., AME. Primary Care and Sports Medicine [facility]  Adjunct Professor of [place] [employer] of Medicine  Risk manager

## 2022-03-11 NOTE — Assessment & Plan Note (Signed)
This is a very pleasant 82 year old male, he has a history of GERD, historically was on pantoprazole, he stopped this sometime ago and has just been using over-the-counter Prilosec. For the past several months has had increasing midepigastric pain with a burning sensation substernal worse when eating any food but significantly worse with spicy foods. He denies any bloody stools, he denies any hematemesis, no dark or tarry stools. Exam today is benign, no tenderness to palpation. We will discontinue his Prilosec and switch to pantoprazole 40 mg twice daily, Carafate. Avoid eating within 2 to 3 hours of bedtime. I would like to see him back in 6 weeks and if persistent discomfort I would like him to return to digestive health specialist for upper endoscopy.

## 2022-03-20 ENCOUNTER — Telehealth: Payer: Self-pay

## 2022-03-20 NOTE — Telephone Encounter (Signed)
error 

## 2022-03-24 ENCOUNTER — Ambulatory Visit (INDEPENDENT_AMBULATORY_CARE_PROVIDER_SITE_OTHER): Payer: Medicare Other | Admitting: Sports Medicine

## 2022-03-24 ENCOUNTER — Ambulatory Visit (INDEPENDENT_AMBULATORY_CARE_PROVIDER_SITE_OTHER): Payer: Medicare Other

## 2022-03-24 VITALS — BP 151/81 | HR 88 | Ht 69.0 in | Wt 165.1 lb

## 2022-03-24 DIAGNOSIS — H02822 Cysts of right lower eyelid: Secondary | ICD-10-CM | POA: Insufficient documentation

## 2022-03-24 DIAGNOSIS — M1000 Idiopathic gout, unspecified site: Secondary | ICD-10-CM

## 2022-03-24 DIAGNOSIS — S92901A Unspecified fracture of right foot, initial encounter for closed fracture: Secondary | ICD-10-CM

## 2022-03-24 DIAGNOSIS — E7800 Pure hypercholesterolemia, unspecified: Secondary | ICD-10-CM

## 2022-03-24 DIAGNOSIS — M7989 Other specified soft tissue disorders: Secondary | ICD-10-CM

## 2022-03-24 DIAGNOSIS — E119 Type 2 diabetes mellitus without complications: Secondary | ICD-10-CM | POA: Diagnosis not present

## 2022-03-24 DIAGNOSIS — S92344A Nondisplaced fracture of fourth metatarsal bone, right foot, initial encounter for closed fracture: Secondary | ICD-10-CM

## 2022-03-24 DIAGNOSIS — S92334A Nondisplaced fracture of third metatarsal bone, right foot, initial encounter for closed fracture: Secondary | ICD-10-CM

## 2022-03-24 MED ORDER — PREDNISONE 50 MG PO TABS: ORAL_TABLET | ORAL | 0 refills | Status: DC

## 2022-03-24 NOTE — Assessment & Plan Note (Signed)
Sebaceous cyst right lower eyelid, warm compresses, he can really leave this alone for now.

## 2022-03-24 NOTE — Progress Notes (Addendum)
    Procedures performed today:    None.  Independent interpretation of notes and tests performed by another provider:   None.  Brief History, Exam, Impression, and Recommendations:    Gout Increasing pain dorsal right midfoot, patient suspects it is gout, he did miss some doses of allopurinol. His dorsal midfoot is somewhat warm, swollen, tender over the metatarsal shafts. Adding x-rays, 5 days of prednisone, I would like to recheck labs including uric acid levels. Return to see me in 4 weeks.  Cyst of right lower eyelid Sebaceous cyst right lower eyelid, warm compresses, he can really leave this alone for now.  Fracture right third and fourth metatarsals X-ray confirmed fracture third and fourth metatarsals, patient needs to return for a boot and discontinue prednisone.    ____________________________________________ Gwen Her. Dianah Field, M.D., ABFM., CAQSM., AME. Primary Care and Sports Medicine [facility]  Adjunct Professor of [place] of [facility] of Medicine  Risk manager

## 2022-03-24 NOTE — Assessment & Plan Note (Signed)
Increasing pain dorsal right midfoot, patient suspects it is gout, he did miss some doses of allopurinol. His dorsal midfoot is somewhat warm, swollen, tender over the metatarsal shafts. Adding x-rays, 5 days of prednisone, I would like to recheck labs including uric acid levels. Return to see me in 4 weeks.

## 2022-03-25 DIAGNOSIS — S92901A Unspecified fracture of right foot, initial encounter for closed fracture: Secondary | ICD-10-CM | POA: Insufficient documentation

## 2022-03-25 LAB — COMPLETE METABOLIC PANEL WITH GFR
AG Ratio: 1.1 (calc) (ref 1.0–2.5)
ALT: 14 U/L (ref 9–46)
AST: 18 U/L (ref 10–35)
Albumin: 3.4 g/dL — ABNORMAL LOW (ref 3.6–5.1)
Alkaline phosphatase (APISO): 129 U/L (ref 35–144)
BUN/Creatinine Ratio: 4 (calc) — ABNORMAL LOW (ref 6–22)
BUN: 48 mg/dL — ABNORMAL HIGH (ref 7–25)
CO2: 31 mmol/L (ref 20–32)
Calcium: 9 mg/dL (ref 8.6–10.3)
Chloride: 90 mmol/L — ABNORMAL LOW (ref 98–110)
Creat: 11.08 mg/dL — ABNORMAL HIGH (ref 0.70–1.22)
Globulin: 3 g/dL (calc) (ref 1.9–3.7)
Glucose, Bld: 144 mg/dL — ABNORMAL HIGH (ref 65–99)
Potassium: 5 mmol/L (ref 3.5–5.3)
Sodium: 135 mmol/L (ref 135–146)
Total Bilirubin: 0.4 mg/dL (ref 0.2–1.2)
Total Protein: 6.4 g/dL (ref 6.1–8.1)
eGFR: 4 mL/min/{1.73_m2} — ABNORMAL LOW (ref >=60)

## 2022-03-25 LAB — LIPID PANEL
Cholesterol: 173 mg/dL (ref <200)
HDL: 50 mg/dL (ref >=40)
LDL Cholesterol (Calc): 108 mg/dL (calc) — ABNORMAL HIGH
Non-HDL Cholesterol (Calc): 123 mg/dL (calc) (ref <130)
Total CHOL/HDL Ratio: 3.5 (calc) (ref <5.0)
Triglycerides: 66 mg/dL (ref <150)

## 2022-03-25 LAB — CBC
HCT: 34.5 % — ABNORMAL LOW (ref 38.5–50.0)
Hemoglobin: 11.1 g/dL — ABNORMAL LOW (ref 13.2–17.1)
MCH: 26.1 pg — ABNORMAL LOW (ref 27.0–33.0)
MCHC: 32.2 g/dL (ref 32.0–36.0)
MCV: 81 fL (ref 80.0–100.0)
Platelets: 198 10*3/uL (ref 140–400)
RBC: 4.26 10*6/uL (ref 4.20–5.80)
RDW: 21.7 % — ABNORMAL HIGH (ref 11.0–15.0)
WBC: 6.8 10*3/uL (ref 3.8–10.8)

## 2022-03-25 LAB — URIC ACID: Uric Acid, Serum: 2.5 mg/dL — ABNORMAL LOW (ref 4.0–8.0)

## 2022-03-25 LAB — TSH: TSH: 1.56 mIU/L (ref 0.40–4.50)

## 2022-03-25 NOTE — Addendum Note (Signed)
Addended by: Silverio Decamp on: 03/25/2022 03:43 PM   Modules accepted: Level of Service

## 2022-03-25 NOTE — Assessment & Plan Note (Signed)
X-ray confirmed fracture third and fourth metatarsals, patient needs to return for a boot and discontinue prednisone.

## 2022-03-25 NOTE — Addendum Note (Signed)
Addended by: Silverio Decamp on: 03/25/2022 03:44 PM   Modules accepted: Level of Service

## 2022-04-03 ENCOUNTER — Encounter: Payer: Self-pay | Admitting: Sports Medicine

## 2022-04-22 ENCOUNTER — Ambulatory Visit (INDEPENDENT_AMBULATORY_CARE_PROVIDER_SITE_OTHER): Payer: Medicare Other | Admitting: Sports Medicine

## 2022-04-22 ENCOUNTER — Ambulatory Visit (INDEPENDENT_AMBULATORY_CARE_PROVIDER_SITE_OTHER): Payer: Medicare Other

## 2022-04-22 DIAGNOSIS — S92901A Unspecified fracture of right foot, initial encounter for closed fracture: Secondary | ICD-10-CM | POA: Diagnosis not present

## 2022-04-22 DIAGNOSIS — I214 Non-ST elevation (NSTEMI) myocardial infarction: Secondary | ICD-10-CM

## 2022-04-22 DIAGNOSIS — H02822 Cysts of right lower eyelid: Secondary | ICD-10-CM | POA: Diagnosis not present

## 2022-04-22 NOTE — Assessment & Plan Note (Signed)
Small sebaceous cyst right lower eyelid, warm compresses have not been effective, even though this is benign he would like it removed, we will bring him back for removal of the right lower eyelid sebaceous cyst. 30-minute slot.

## 2022-04-22 NOTE — Progress Notes (Signed)
    Procedures performed today:    None.  Independent interpretation of notes and tests performed by another provider:   None.  Brief History, Exam, Impression, and Recommendations:    Fracture right third and fourth metatarsals Trayvonne returns, he was having right foot pain, we did an x-ray that confirmed third and fourth metatarsal fractures, he has been in a postop shoe for now. He is improving considerably, I would like updated x-rays and we can revisit this at the end of March.  Cyst of right lower eyelid Small sebaceous cyst right lower eyelid, warm compresses have not been effective, even though this is benign he would like it removed, we will bring him back for removal of the right lower eyelid sebaceous cyst. 30-minute slot.  NSTEMI (non-ST elevated myocardial infarction) ([place]) Jacameron has been having increasing heartburn, we doubled his Protonix and this has helped to some degree, today he has been endorsing increasing chest burning with exertion. 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 I am concerned that he is having recurrence of of angina. I would like him to get back in with his cardiologist.    ____________________________________________ Gwen Her. Dianah Field, M.D., ABFM., CAQSM., AME. Primary Care and Sports Medicine [facility] [facility]  Adjunct Professor of [place] of [facility] of Medicine  Risk manager

## 2022-04-22 NOTE — Assessment & Plan Note (Signed)
Brian Rose returns, he was having right foot pain, we did an x-ray that confirmed third and fourth metatarsal fractures, he has been in a postop shoe for now. He is improving considerably, I would like updated x-rays and we can revisit this at the end of March.

## 2022-04-22 NOTE — Assessment & Plan Note (Signed)
Brian Rose has been having increasing heartburn, we doubled his Protonix and this has helped to some degree, today he has been endorsing increasing chest burning with exertion. 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 I am concerned that he is having recurrence of of angina. I would like him to get back in with his cardiologist.

## 2022-04-29 ENCOUNTER — Ambulatory Visit: Payer: Medicare Other | Admitting: Sports Medicine

## 2022-05-02 ENCOUNTER — Ambulatory Visit: Payer: Medicare Other | Admitting: Sports Medicine

## 2022-05-02 ENCOUNTER — Ambulatory Visit (INDEPENDENT_AMBULATORY_CARE_PROVIDER_SITE_OTHER): Payer: Medicare Other | Admitting: Sports Medicine

## 2022-05-02 ENCOUNTER — Encounter: Payer: Self-pay | Admitting: Sports Medicine

## 2022-05-02 VITALS — BP 123/60 | HR 76 | Resp 20 | Ht 69.0 in | Wt 161.7 lb

## 2022-05-02 DIAGNOSIS — H02822 Cysts of right lower eyelid: Secondary | ICD-10-CM | POA: Diagnosis not present

## 2022-05-02 NOTE — Progress Notes (Signed)
    Procedures performed today:    Procedure:  Excision of 1 cm subcutaneous sebaceous cyst right lower eyelid Risks, benefits, and alternatives explained and consent obtained. Time out conducted. Surface prepped with alcohol. 1cc lidocaine without epinephine infiltrated in a field block. Adequate anesthesia ensured. Area prepped and draped in a sterile fashion. Excision performed with: I made a linear incision over the sebaceous cyst, that I used blunt dissection to remove the cyst contents, using my pickups I curetted the inside of the cyst and then closed it with a 2 simple interrupted 6-0 Vicryl sutures. Hemostasis achieved. Pt stable.  Independent interpretation of notes and tests performed by another provider:   None.  Brief History, Exam, Impression, and Recommendations:    Cyst of right lower eyelid Today I performed surgical excision of a right lower eyelid sebaceous cyst. Return to see me in 5 days for suture removal/wound check.    ____________________________________________ Gwen Her. Dianah Field, M.D., ABFM., CAQSM., AME. Primary Care and Sports Medicine [place] [facility]  Adjunct Professor of [place] of [facility] of Medicine  Risk manager

## 2022-05-02 NOTE — Assessment & Plan Note (Signed)
Today I performed surgical excision of a right lower eyelid sebaceous cyst. Return to see me in 5 days for suture removal/wound check.

## 2022-05-07 ENCOUNTER — Ambulatory Visit (INDEPENDENT_AMBULATORY_CARE_PROVIDER_SITE_OTHER): Payer: Medicare Other | Admitting: Sports Medicine

## 2022-05-07 DIAGNOSIS — H02822 Cysts of right lower eyelid: Secondary | ICD-10-CM

## 2022-05-07 NOTE — Progress Notes (Signed)
    Procedures performed today:    None.  Independent interpretation of notes and tests performed by another provider:   None.  Brief History, Exam, Impression, and Recommendations:    Cyst of right lower eyelid 5 days status post cyst excision, incision is clean, dry, intact, sutures removed, return as needed.    ____________________________________________ Gwen Her. Dianah Field, M.D., ABFM., CAQSM., AME. Primary Care and Sports Medicine [place] [facility] [facility]  Adjunct Professor of [place] of [facility] of Medicine  Risk manager

## 2022-05-07 NOTE — Assessment & Plan Note (Signed)
5 days status post cyst excision, incision is clean, dry, intact, sutures removed, return as needed.

## 2022-05-12 ENCOUNTER — Telehealth: Payer: Self-pay | Admitting: Sports Medicine

## 2022-05-12 NOTE — Telephone Encounter (Signed)
Contacted Brian Rose. to schedule their annual wellness visit. Appointment made for 05/26/2022 at [facility] Direct Dial: [phone]

## 2022-05-26 ENCOUNTER — Ambulatory Visit (INDEPENDENT_AMBULATORY_CARE_PROVIDER_SITE_OTHER): Payer: Medicare Other | Admitting: Sports Medicine

## 2022-05-26 DIAGNOSIS — Z0000 Encounter for general adult medical examination without abnormal findings: Secondary | ICD-10-CM | POA: Diagnosis not present

## 2022-05-26 NOTE — Patient Instructions (Addendum)
[place] Maintenance Summary and Written Plan of Care  [name] ,  Thank you for allowing me to perform your Medicare Annual Wellness Visit and for your ongoing commitment to your health.   Health Maintenance & Immunization History Health Maintenance  Topic Date Due   OPHTHALMOLOGY EXAM  05/26/2022 (Originally 05/31/2021)   FOOT EXAM  05/27/2022 (Originally 07/11/2020)   HEMOGLOBIN A1C  09/04/2022   INFLUENZA VACCINE  09/25/2022   Medicare Annual Wellness (AWV)  05/26/2023   DTaP/Tdap/Td (2 - Td or Tdap) 01/01/2025   Pneumonia Vaccine 70+ Years old  Completed   HPV VACCINES  Aged Out   COVID-19 Vaccine  Discontinued   Zoster Vaccines- Shingrix  Discontinued   Immunization History  Administered Date(s) Administered   Influenza,inj,Quad PF,6+ Mos 01/02/2015, 12/05/2015   Influenza-Unspecified 12/02/2017, 11/25/2018, 12/09/2019, 10/25/2020   Moderna Sars-Covid-2 Vaccination 04/21/2019, 05/19/2019   Pneumococcal Conjugate-13 01/02/2015   Pneumococcal Polysaccharide-23 04/03/2016   Tdap 01/02/2015   Zoster, Live 12/05/2015    These are the patient goals that we discussed:  Goals Addressed               This Visit's Progress     Patient Stated (pt-stated)        Patient stated that he would like to be able to walk upstairs again.         This is a list of Health Maintenance Items that are overdue or due now: Foot exam Eye exam    Orders/Referrals Placed Today: No orders of the defined types were placed in this encounter.  (Contact our referral department at [phone] if you have not spoken with someone about your referral appointment within the next 5 days)    Follow-up Plan Follow-up with Silverio Decamp, MD as planned Patient will schedule eye exam.  Foot exam can be completed at the next in office visit. Please have your eye exam records faxed to [place]. Medicare wellness visit in one year.  Patient will access AVS on my  chart      Health Maintenance, Male Adopting a healthy lifestyle and getting preventive care are important in promoting health and wellness. Ask your health care provider about: The right schedule for you to have regular tests and exams. Things you can do on your own to prevent diseases and keep yourself healthy. What should I know about diet, weight, and exercise? Eat a healthy diet  Eat a diet that includes plenty of vegetables, fruits, low-fat dairy products, and lean protein. Do not eat a lot of foods that are high in solid fats, added sugars, or sodium. Maintain a healthy weight Body mass index (BMI) is a measurement that can be used to identify possible weight problems. It estimates body fat based on height and weight. Your health care provider can help determine your BMI and help you achieve or maintain a healthy weight. Get regular exercise Get regular exercise. This is one of the most important things you can do for your health. Most adults should: Exercise for at least 150 minutes each week. The exercise should increase your heart rate and make you sweat (moderate-intensity exercise). Do strengthening exercises at least twice a week. This is in addition to the moderate-intensity exercise. Spend less time sitting. Even light physical activity can be beneficial. Watch cholesterol and blood lipids Have your blood tested for lipids and cholesterol at 83 years of age, then have this test every 5 years. You may need to have your cholesterol  levels checked more often if: Your lipid or cholesterol levels are high. You are older than 83 years of age. You are at high risk for heart disease. What should I know about cancer screening? Many types of cancers can be detected early and may often be prevented. Depending on your health history and family history, you may need to have cancer screening at various ages. This may include screening for: Colorectal cancer. Prostate cancer. Skin  cancer. Lung cancer. What should I know about heart disease, diabetes, and high blood pressure? Blood pressure and heart disease High blood pressure causes heart disease and increases the risk of stroke. This is more likely to develop in people who have high blood pressure readings or are overweight. Talk with your health care provider about your target blood pressure readings. Have your blood pressure checked: Every 3-5 years if you are 26-40 years of age. Every year if you are 94 years old or older. If you are between the ages of 50 and 29 and are a current or former smoker, ask your health care provider if you should have a one-time screening for abdominal aortic aneurysm (AAA). Diabetes Have regular diabetes screenings. This checks your fasting blood sugar level. Have the screening done: Once every three years after age 99 if you are at a normal weight and have a low risk for diabetes. More often and at a younger age if you are overweight or have a high risk for diabetes. What should I know about preventing infection? Hepatitis B If you have a higher risk for hepatitis B, you should be screened for this virus. Talk with your health care provider to find out if you are at risk for hepatitis B infection. Hepatitis C Blood testing is recommended for: Everyone born from [birth year] through 1966. Anyone with known risk factors for hepatitis C. Sexually transmitted infections (STIs) You should be screened each year for STIs, including gonorrhea and chlamydia, if: You are sexually active and are younger than 83 years of age. You are older than 83 years of age and your health care provider tells you that you are at risk for this type of infection. Your sexual activity has changed since you were last screened, and you are at increased risk for chlamydia or gonorrhea. Ask your health care provider if you are at risk. Ask your health care provider about whether you are at high risk for HIV. Your health  care provider may recommend a prescription medicine to help prevent HIV infection. If you choose to take medicine to prevent HIV, you should first get tested for HIV. You should then be tested every 3 months for as long as you are taking the medicine. Follow these instructions at home: Alcohol use Do not drink alcohol if your health care provider tells you not to drink. If you drink alcohol: Limit how much you have to 0-2 drinks a day. Know how much alcohol is in your drink. In the [place] one drink equals one 12 oz bottle of beer (355 mL), one 5 oz glass of wine (148 mL), or one 1 oz glass of hard liquor (44 mL). Lifestyle Do not use any products that contain nicotine or tobacco. These products include cigarettes, chewing tobacco, and vaping devices, such as e-cigarettes. If you need help quitting, ask your health care provider. Do not use street drugs. Do not share needles. Ask your health care provider for help if you need support or information about quitting drugs. General instructions Schedule regular health,  dental, and eye exams. Stay current with your vaccines. Tell your health care provider if: You often feel depressed. You have ever been abused or do not feel safe at home. Summary Adopting a healthy lifestyle and getting preventive care are important in promoting health and wellness. Follow your health care provider's instructions about healthy diet, exercising, and getting tested or screened for diseases. Follow your health care provider's instructions on monitoring your cholesterol and blood pressure. This information is not intended to replace advice given to you by your health care provider. Make sure you discuss any questions you have with your health care provider. Document Revised: 07/02/2020 Document Reviewed: 07/02/2020 Elsevier Patient Education  [place].

## 2022-05-26 NOTE — Progress Notes (Signed)
MEDICARE ANNUAL WELLNESS VISIT  05/26/2022  Telephone Visit Disclaimer This Medicare AWV was conducted by telephone due to national recommendations for restrictions regarding the COVID-19 Pandemic (e.g. social distancing).  I verified, using two identifiers, that I am speaking with Brian Rose. or their authorized healthcare agent. I discussed the limitations, risks, security, and privacy concerns of performing an evaluation and management service by telephone and the potential availability of an in-person appointment in the future. The patient expressed understanding and agreed to proceed.  Location of Patient: Home Location of Provider (nurse):  Provider home  Subjective:    Brian Rose. is a 82 y.o. male patient of Thekkekandam, Gwen Her, MD who had a Medicare Annual Wellness Visit today via telephone. Tramain is Retired and lives with their spouse. he has 3 children. he reports that he is socially active and does interact with friends/family regularly. he is minimally physically active and enjoys fishing.  Patient Care Team: Silverio Decamp, MD as PCP - General (Family Medicine)     05/26/2022    2:15 PM 08/07/2021    1:49 PM 07/09/2020    3:18 PM 04/03/2016   10:33 AM 12/12/2015    8:49 AM  Advanced Directives  Does Patient Have a Medical Advance Directive? No Yes Yes Yes Yes  Type of Advance Directive   Living will;Healthcare Power of [place];Living will [place];Living will  Does patient want to make changes to medical advance directive?   No - Patient declined No - Patient declined   Copy of [place] in Chart?   No - copy requested No - copy requested No - copy requested  Would patient like information on creating a medical advance directive? No - Patient declined        Hospital Utilization Over the Past 12 Months: # of hospitalizations or ER visits: 0 # of surgeries: 0  Review of Systems     Patient reports that his overall health is unchanged compared to last year.  History obtained from chart review and the patient  Patient Reported Readings (BP, Pulse, CBG, Weight, etc) none  Pain Assessment Pain : No/denies pain     Current Medications & Allergies (verified) Allergies as of 05/26/2022   No Known Allergies      Medication List        Accurate as of May 26, 2022  2:29 PM. If you have any questions, ask your nurse or doctor.          acetaminophen 500 MG tablet Commonly known as: TYLENOL Take by mouth.   allopurinol 100 MG tablet Commonly known as: ZYLOPRIM Take 200 mg by mouth daily.   amLODipine 10 MG tablet Commonly known as: NORVASC Take 1 tablet (10 mg total) by mouth daily.   aspirin 81 MG tablet Take 81 mg by mouth 2 (two) times daily.   B-12 1000 MCG Caps Take by mouth.   Landscape architect w/Device Kit Use as needed.   bisacodyl 10 MG suppository Commonly known as: Bisacodyl Laxative Place 1 suppository (10 mg total) rectally as needed for moderate constipation.   Brilinta 90 MG Tabs tablet Generic drug: ticagrelor Take 90 mg by mouth 2 (two) times daily.   calcitRIOL 0.25 MCG capsule Commonly known as: ROCALTROL Take by mouth.   carvedilol 25 MG tablet Commonly known as: COREG Take 1 tablet (25 mg total) by mouth 2 (two) times daily with a meal.  epoetin alfa-epbx 10000 UNIT/ML injection Commonly known as: RETACRIT Inject into the skin. Once a month   Fish Oil 1000 MG Caps Take by mouth.   fluticasone 50 MCG/ACT nasal spray Commonly known as: FLONASE SHAKE LIQUID AND USE 1 SPRAY IN EACH NOSTRIL DAILY   folic acid [id number] MCG tablet Commonly known as: FOLVITE Take by mouth.   lactulose 10 GM/15ML solution Commonly known as: CHRONULAC Take 15 mLs (10 g total) by mouth 2 (two) times daily.   metolazone 5 MG tablet Commonly known as: ZAROXOLYN TAKE 1 TABLET(5 MG) BY MOUTH DAILY AS NEEDED   MULTIPLE  VITAMINS-MINERALS PO Take 1 tablet by mouth every morning.   pantoprazole 40 MG tablet Commonly known as: PROTONIX Take 1 tablet (40 mg total) by mouth 2 (two) times daily before a meal.   pioglitazone 30 MG tablet Commonly known as: ACTOS Take 1 tablet (30 mg total) by mouth daily.   predniSONE 50 MG tablet Commonly known as: DELTASONE One tab PO daily for 5 days.   Prodigy Lancets 28G Misc Use to check blood sugar 2 time(s) daily   Prodigy No Coding Blood Gluc test strip Generic drug: glucose blood 2 (two) times daily.   sodium chloride 0.65 % Soln nasal spray Commonly known as: OCEAN Place 1 spray into both nostrils as needed for congestion.   sucralfate 1 g tablet Commonly known as: CARAFATE Take 1 tablet (1 g total) by mouth 4 (four) times daily.   VITAMIN B-3 PO Take by mouth.   zolpidem 5 MG tablet Commonly known as: AMBIEN Take 1 tablet (5 mg total) by mouth at bedtime as needed for sleep.        History (reviewed): Past Medical History:  Diagnosis Date   Chronic kidney disease    Diabetes    Hypertension    History reviewed. No pertinent surgical history. Family History  Problem Relation Age of Onset   Diabetes Mother    Hypertension Mother    Hypertension Father    Heart attack Father 52       This is what he died from.   Diabetes Sister    Diabetes Brother    Diabetes Brother    Diabetes Sister    Social History   Socioeconomic History   Marital status: Married    Spouse name: Shirlee Limerick   Number of children: 3   Years of education: 12   Highest education level: 12th grade  Occupational History    Comment: Retired  Tobacco Use   Smoking status: Former    Packs/day: 1.00    Years: 20.00    Additional pack years: 0.00    Total pack years: 20.00    Types: Cigars, Cigarettes    Quit date: 09/06/1981    Years since quitting: 40.7   Smokeless tobacco: Never  Substance and Sexual Activity   Alcohol use: No   Drug use: No   Sexual  activity: Yes    Partners: Female  Other Topics Concern   Not on file  Social History Narrative   Lives with his wife. Likes to go fishing when he can.   Social Determinants of Health   Financial Resource Strain: Low Risk  (05/26/2022)   Overall Financial Resource Strain (CARDIA)    Difficulty of Paying Living Expenses: Not hard at all  Food Insecurity: No Food Insecurity (05/26/2022)   Hunger Vital Sign    Worried About Running Out of Food in the Last Year: Never true  Ran Out of Food in the Last Year: Never true  Transportation Needs: No Transportation Needs (05/26/2022)   PRAPARE - Hydrologist (Medical): No    Lack of Transportation (Non-Medical): No  Physical Activity: Inactive (05/26/2022)   Exercise Vital Sign    Days of Exercise per Week: 0 days    Minutes of Exercise per Session: 0 min  Stress: No Stress Concern Present (05/26/2022)   [place]    Feeling of Stress : Not at all  Social Connections: Moderately Isolated (05/26/2022)   Social Connection and Isolation Panel [NHANES]    Frequency of Communication with Friends and Family: Twice a week    Frequency of Social Gatherings with Friends and Family: Never    Attends Religious Services: More than 4 times per year    Active Member of [employer] or Organizations: Not on file    Attends Archivist Meetings: Never    Marital Status: Married    Activities of Daily Living    05/26/2022    2:19 PM  In your present state of health, do you have any difficulty performing the following activities:  Hearing? 0  Vision? 0  Difficulty concentrating or making decisions? 0  Walking or climbing stairs? 1  Dressing or bathing? 0  Doing errands, shopping? 0  Preparing Food and eating ? N  Using the Toilet? N  In the past six months, have you accidently leaked urine? N  Do you have problems with loss of bowel control? N  Managing your  Medications? N  Managing your Finances? N  Housekeeping or managing your Housekeeping? N    Patient Education/ Literacy How often do you need to have someone help you when you read instructions, pamphlets, or other written materials from your doctor or pharmacy?: 1 - Never What is the last grade level you completed in school?: 2 years  of college  Exercise Current Exercise Habits: Home exercise routine, Type of exercise: stretching, Time (Minutes): 15, Frequency (Times/Week): 3, Weekly Exercise (Minutes/Week): 45, Exercise limited by: orthopedic condition(s)  Diet Patient reports consuming 2 meals a day and 3 snack(s) a day Patient reports that his primary diet is: Regular Patient reports that she does have regular access to food.   Depression Screen    05/26/2022    2:17 PM 04/26/2021   10:14 AM 07/09/2020    3:19 PM 09/02/2017   11:41 AM 04/21/2017    2:10 PM 04/03/2016   10:27 AM 12/12/2014    3:45 PM  PHQ 2/9 Scores  PHQ - 2 Score 0 0 2 1 0 0 0  PHQ- 9 Score   5 4        Fall Risk    05/26/2022    2:17 PM 04/26/2021   10:15 AM 04/26/2021   10:14 AM 07/09/2020    3:19 PM 09/23/2018    5:58 PM  [place] in the past year? 0 1 1 1  0  Comment     Emmi Telephone Survey: data to providers prior to load  Number falls in past yr: 0 0 0 0   Injury with Fall? 0 1 1 1    Risk for fall due to : No Fall Risks  Impaired balance/gait History of fall(s)   Follow up Falls evaluation completed  Falls evaluation completed;Falls prevention discussed;Education provided;Follow up appointment Falls evaluation completed;Education provided;Falls prevention discussed      Objective:  Brian Rose. seemed alert and oriented and he participated appropriately during our telephone visit.  Blood Pressure Weight BMI  BP Readings from Last 3 Encounters:  05/02/22 123/60  03/24/22 (!) 151/81  03/11/22 (!) 165/75   Wt Readings from Last 3 Encounters:  05/02/22 161 lb 11.2 oz (73.3 kg)  03/24/22  165 lb 1.9 oz (74.9 kg)  01/22/22 163 lb 8 oz (74.2 kg)   BMI Readings from Last 1 Encounters:  05/02/22 23.88 kg/m    *Unable to obtain current vital signs, weight, and BMI due to telephone visit type  Hearing/Vision  Lyndle did not seem to have difficulty with hearing/understanding during the telephone conversation Reports that he has not had a formal eye exam by an eye care professional within the past year Reports that he has not had a formal hearing evaluation within the past year *Unable to fully assess hearing and vision during telephone visit type  Cognitive Function:    05/26/2022    2:21 PM 07/09/2020    3:28 PM  6CIT Screen  What Year? 0 points 0 points  What month? 0 points 0 points  What time? 0 points 0 points  Count back from 20 0 points 0 points  Months in reverse 0 points 0 points  Repeat phrase 0 points 0 points  Total Score 0 points 0 points   (Normal:0-7, Significant for Dysfunction: >8)  Normal Cognitive Function Screening: Yes   Immunization & Health Maintenance Record Immunization History  Administered Date(s) Administered   Influenza,inj,Quad PF,6+ Mos 01/02/2015, 12/05/2015   Influenza-Unspecified 12/02/2017, 11/25/2018, 12/09/2019, 10/25/2020   Moderna Sars-Covid-2 Vaccination 04/21/2019, 05/19/2019   Pneumococcal Conjugate-13 01/02/2015   Pneumococcal Polysaccharide-23 04/03/2016   Tdap 01/02/2015   Zoster, Live 12/05/2015    Health Maintenance  Topic Date Due   Medicare Annual Wellness (AWV)  07/09/2021   OPHTHALMOLOGY EXAM  05/26/2022 (Originally 05/31/2021)   FOOT EXAM  05/27/2022 (Originally 07/11/2020)   HEMOGLOBIN A1C  09/04/2022   INFLUENZA VACCINE  09/25/2022   DTaP/Tdap/Td (2 - Td or Tdap) 01/01/2025   Pneumonia Vaccine 4+ Years old  Completed   HPV VACCINES  Aged Out   COVID-19 Vaccine  Discontinued   Zoster Vaccines- Shingrix  Discontinued       Assessment  This is a routine wellness examination for [employer]Marland Kitchen  Health Maintenance: Due or Overdue Health Maintenance Due  Topic Date Due   Medicare Annual Wellness (AWV)  07/09/2021    Brian Rose. does not need a referral for Community Assistance: Care Management:   no Social Work:    no Prescription Assistance:  no Nutrition/Diabetes Education:  no   Plan:  Personalized Goals  Goals Addressed               This Visit's Progress     Patient Stated (pt-stated)        Patient stated that he would like to be able to walk upstairs again.       Personalized Health Maintenance & Screening Recommendations  Foot exam Eye exam  Lung Cancer Screening Recommended: no (Low Dose CT Chest recommended if Age 25-80 years, 30 pack-year currently smoking OR have quit w/in past 15 years) Hepatitis C Screening recommended: no HIV Screening recommended: no  Advanced Directives: Written information was not prepared per patient's request.  Referrals & Orders No orders of the defined types were placed in this encounter.   Follow-up Plan Follow-up with Silverio Decamp, MD as planned Patient will  schedule eye exam.  Foot exam can be completed at the next in office visit. Please have your eye exam records faxed to [place]. Medicare wellness visit in one year.  Patient will access AVS on my chart   I have personally reviewed and noted the following in the patient's chart:   Medical and social history Use of alcohol, tobacco or illicit drugs  Current medications and supplements Functional ability and status Nutritional status Physical activity Advanced directives List of other physicians Hospitalizations, surgeries, and ER visits in previous 12 months Vitals Screenings to include cognitive, depression, and falls Referrals and appointments  In addition, I have reviewed and discussed with Brian Rose. certain preventive protocols, quality metrics, and best practice recommendations. A written personalized care plan for  preventive services as well as general preventive health recommendations is available and can be mailed to the patient at his request.      Tinnie Gens, RN BSN  05/26/2022

## 2022-11-20 ENCOUNTER — Telehealth: Payer: Self-pay | Admitting: Sports Medicine

## 2022-11-20 ENCOUNTER — Ambulatory Visit: Payer: Medicare Other

## 2022-11-20 VITALS — BP 126/66 | HR 93 | Ht 69.0 in | Wt 158.2 lb

## 2022-11-20 DIAGNOSIS — I10 Essential (primary) hypertension: Secondary | ICD-10-CM

## 2022-11-20 NOTE — Progress Notes (Signed)
HPI  Pt comes in today to have BP check, states it has been running lower than normal. Today my first BP reading on the LA was 126/66 checked it 10 mins later on the LA and got 135/66.      Assessment and plan: Spoke with Dr. Karie Schwalbe in regards to pt BP he would like him to stop his amlodipine 10mg  and continue the carvedilol 25mg  and f/u with in 2 weeks which was scheduled today.

## 2022-11-20 NOTE — Telephone Encounter (Signed)
Stop amlodipine and come in ASAP for nurse visit blood pressure check.  What was his pulse rate?

## 2022-11-20 NOTE — Assessment & Plan Note (Signed)
Potential episode of asymptomatic hypotension, grade he tells me he has not been on his blood pressure medication, amlodipine or carvedilol. We brought him here for nurse visit, blood pressure was well-controlled and stable off of medication. He does need to restart his carvedilol for cardiovascular protection, however I would like him to stay off of amlodipine and I can see him back in the office in about 2 weeks to ensure things are stable.

## 2022-11-20 NOTE — Telephone Encounter (Signed)
Called and spoke to patient and he is coming in today at 3:00 for a nurse visit.

## 2022-11-20 NOTE — Progress Notes (Signed)
    Procedures performed today:    None.  Independent interpretation of notes and tests performed by another provider:   None.  Brief History, Exam, Impression, and Recommendations:    Essential hypertension, benign Potential episode of asymptomatic hypotension, grade he tells me he has not been on his blood pressure medication, amlodipine or carvedilol. We brought him here for nurse visit, blood pressure was well-controlled and stable off of medication. He does need to restart his carvedilol for cardiovascular protection, however I would like him to stay off of amlodipine and I can see him back in the office in about 2 weeks to ensure things are stable.   ____________________________________________ [name]. Benjamin Stain, M.D., ABFM., CAQSM., AME. Primary Care and Sports Medicine [facility] [facility]  Adjunct Professor of Family Medicine  [place] [employer] [facility] [employer]  Restaurant manager, fast food

## 2022-11-20 NOTE — Telephone Encounter (Signed)
Pt called. He states that his bp has been dropping to the 80's for the past couple of days.

## 2022-11-20 NOTE — Addendum Note (Signed)
Addended by: Monica Becton on: 11/20/2022 04:32 PM   Modules accepted: Level of Service

## 2022-12-04 ENCOUNTER — Ambulatory Visit (INDEPENDENT_AMBULATORY_CARE_PROVIDER_SITE_OTHER): Payer: Medicare Other | Admitting: Sports Medicine

## 2022-12-04 ENCOUNTER — Encounter: Payer: Self-pay | Admitting: Sports Medicine

## 2022-12-04 ENCOUNTER — Telehealth: Payer: Self-pay | Admitting: Sports Medicine

## 2022-12-04 ENCOUNTER — Ambulatory Visit: Payer: Medicare Other

## 2022-12-04 VITALS — BP 148/72 | HR 79 | Ht 69.0 in | Wt 167.0 lb

## 2022-12-04 DIAGNOSIS — M170 Bilateral primary osteoarthritis of knee: Secondary | ICD-10-CM

## 2022-12-04 DIAGNOSIS — M19011 Primary osteoarthritis, right shoulder: Secondary | ICD-10-CM

## 2022-12-04 DIAGNOSIS — I214 Non-ST elevation (NSTEMI) myocardial infarction: Secondary | ICD-10-CM

## 2022-12-04 DIAGNOSIS — I10 Essential (primary) hypertension: Secondary | ICD-10-CM

## 2022-12-04 NOTE — Progress Notes (Signed)
    Procedures performed today:    None.  Independent interpretation of notes and tests performed by another provider:   None.  Brief History, Exam, Impression, and Recommendations:    Essential hypertension, benign Brian Rose has had fairly labile blood pressures, he has had a few systolics in the 80s and he does feel somewhat tired and dizzy. He was historically on amlodipine 10 and carvedilol high dose. We had him discontinue his amlodipine, and his blood pressure returned to normal. Now it is back up. 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 In the meantime we will restart his amlodipine but at half dose/5 mg. He will let me know in a couple of weeks how the blood pressure is looking at home  Primary osteoarthritis of both knees Bilateral knee osteoarthritis, he was treated by an outside provider, he tells me he did get some PRP which was not effective, he has never had viscosupplementation. He is interested in having me address his knees, he does have diabetes so we will minimize steroid injections, adding bilateral x-rays today, home physical therapy, and approval for gel. We can start the gel shots when he gets the approval.  Primary osteoarthritis, right shoulder Known right shoulder osteoarthritis managed at an outside facility, he tells me he did well with PRP, he is interested in this again, he will get some x-rays today, home physical therapy and he will let me know when he is ready to try PRP.    ____________________________________________ [name]. Benjamin Stain, M.D., ABFM., CAQSM., AME. Primary Care and Sports Medicine [place] [facility]  Adjunct Professor of Family Medicine  [place] [employer] [facility] [employer]  [employer]

## 2022-12-04 NOTE — Assessment & Plan Note (Signed)
Known right shoulder osteoarthritis managed at an outside facility, he tells me he did well with PRP, he is interested in this again, he will get some x-rays today, home physical therapy and he will let me know when he is ready to try PRP.

## 2022-12-04 NOTE — Assessment & Plan Note (Signed)
Brian Rose has had fairly labile blood pressures, he has had a few systolics in the 80s and he does feel somewhat tired and dizzy. He was historically on amlodipine 10 and carvedilol high dose. We had him discontinue his amlodipine, and his blood pressure returned to normal. Now it is back up. 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 In the meantime we will restart his amlodipine but at half dose/5 mg. He will let me know in a couple of weeks how the blood pressure is looking at home

## 2022-12-04 NOTE — Assessment & Plan Note (Signed)
Bilateral knee osteoarthritis, he was treated by an outside provider, he tells me he did get some PRP which was not effective, he has never had viscosupplementation. He is interested in having me address his knees, he does have diabetes so we will minimize steroid injections, adding bilateral x-rays today, home physical therapy, and approval for gel. We can start the gel shots when he gets the approval.

## 2022-12-04 NOTE — Telephone Encounter (Signed)
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

## 2022-12-11 NOTE — Telephone Encounter (Signed)
PA information submitted via [email or web address] for Orthovisc Paperwork has been printed and given to Dr. T for signatures. Once obtained, information will be faxed to [employer] at [phone]  

## 2022-12-24 NOTE — Telephone Encounter (Signed)
Patient was approved fully covered no copay and he was notified and will call back and get scheduled.

## 2023-01-14 ENCOUNTER — Ambulatory Visit (INDEPENDENT_AMBULATORY_CARE_PROVIDER_SITE_OTHER): Payer: Medicare Other | Admitting: Sports Medicine

## 2023-01-14 ENCOUNTER — Other Ambulatory Visit (INDEPENDENT_AMBULATORY_CARE_PROVIDER_SITE_OTHER): Payer: Medicare Other

## 2023-01-14 DIAGNOSIS — M170 Bilateral primary osteoarthritis of knee: Secondary | ICD-10-CM | POA: Diagnosis not present

## 2023-01-14 MED ORDER — HYALURONAN 30 MG/2ML IX SOSY
30.0000 mg | PREFILLED_SYRINGE | Freq: Once | INTRA_ARTICULAR | Status: AC
  Administered 2023-01-14: 30 mg via INTRA_ARTICULAR

## 2023-01-14 MED ORDER — TRIAMCINOLONE ACETONIDE 40 MG/ML IJ SUSP
80.0000 mg | Freq: Once | INTRAMUSCULAR | Status: AC
  Administered 2023-01-14: 80 mg via INTRAMUSCULAR

## 2023-01-14 NOTE — Progress Notes (Signed)
    Procedures performed today:    Procedure: Real-time Ultrasound Guided injection of the left knee Device: Samsung HS60  Verbal informed consent obtained.  Time-out conducted.  Noted no overlying erythema, induration, or other signs of local infection.  Skin prepped in a sterile fashion.  Local anesthesia: Topical Ethyl chloride.  With sterile technique and under real time ultrasound guidance: Mild effusion noted ,1 cc Kenalog 40, 2 cc lidocaine, 2 cc bupivacaine injected easily, syringe switched and 30 mg/2 mL of OrthoVisc (sodium hyaluronate) in a prefilled syringe was injected easily into the knee through a 22-gauge needle. Completed without difficulty  Advised to call if fevers/chills, erythema, induration, drainage, or persistent bleeding.  Images permanently stored and available for review in PACS.  Impression: Technically successful ultrasound guided injection.  Procedure: Real-time Ultrasound Guided injection of the right knee Device: Samsung HS60  Verbal informed consent obtained.  Time-out conducted.  Noted no overlying erythema, induration, or other signs of local infection.  Skin prepped in a sterile fashion.  Local anesthesia: Topical Ethyl chloride.  With sterile technique and under real time ultrasound guidance: Moderate effusion noted ,1 cc Kenalog 40, 2 cc lidocaine, 2 cc bupivacaine injected easily, syringe switched and 30 mg/2 mL of OrthoVisc (sodium hyaluronate) in a prefilled syringe was injected easily into the knee through a 22-gauge needle. Completed without difficulty  Advised to call if fevers/chills, erythema, induration, drainage, or persistent bleeding.  Images permanently stored and available for review in PACS.  Impression: Technically successful ultrasound guided injection.  Independent interpretation of notes and tests performed by another provider:   None.  Brief History, Exam, Impression, and Recommendations:    Primary osteoarthritis of both  knees Known bilateral knee osteoarthritis, he has had PRP in the past by an outside provider that was not effective, never had Visco. We got him approved for Visco, today we will do bilateral steroid injections and Orthovisc No. 1 of 4 both knees, return in 1 week for #2 of 4 both knees. Co-pay was $0.    ____________________________________________ [name]. Benjamin Stain, M.D., ABFM., CAQSM., AME. Primary Care and Sports Medicine [place] [facility]  Adjunct Professor of Family Medicine  [place] [employer] [facility] [employer]  Restaurant manager, fast food

## 2023-01-14 NOTE — Assessment & Plan Note (Signed)
Known bilateral knee osteoarthritis, he has had PRP in the past by an outside provider that was not effective, never had Visco. We got him approved for Visco, today we will do bilateral steroid injections and Orthovisc No. 1 of 4 both knees, return in 1 week for #2 of 4 both knees. Co-pay was $0.

## 2023-01-21 ENCOUNTER — Other Ambulatory Visit (INDEPENDENT_AMBULATORY_CARE_PROVIDER_SITE_OTHER): Payer: Medicare Other

## 2023-01-21 ENCOUNTER — Ambulatory Visit: Payer: Medicare Other | Admitting: Sports Medicine

## 2023-01-21 DIAGNOSIS — M170 Bilateral primary osteoarthritis of knee: Secondary | ICD-10-CM | POA: Diagnosis not present

## 2023-01-21 MED ORDER — HYALURONAN 30 MG/2ML IX SOSY
30.0000 mg | PREFILLED_SYRINGE | Freq: Once | INTRA_ARTICULAR | Status: AC
  Administered 2023-01-21: 30 mg via INTRA_ARTICULAR

## 2023-01-21 NOTE — Progress Notes (Addendum)
    Procedures performed today:    Procedure: Real-time Ultrasound Guided injection of the left knee Device: Samsung HS60  Verbal informed consent obtained.  Time-out conducted.  Noted no overlying erythema, induration, or other signs of local infection.  Skin prepped in a sterile fashion.  Local anesthesia: Topical Ethyl chloride.  With sterile technique and under real time ultrasound guidance: Mild effusion noted, 30 mg/2 mL of OrthoVisc (sodium hyaluronate) in a prefilled syringe was injected easily into the knee through a 22-gauge needle. Completed without difficulty  Advised to call if fevers/chills, erythema, induration, drainage, or persistent bleeding.  Images permanently stored and available for review in PACS.  Impression: Technically successful ultrasound guided injection.   Procedure: Real-time Ultrasound Guided injection of the right knee Device: Samsung HS60  Verbal informed consent obtained.  Time-out conducted.  Noted no overlying erythema, induration, or other signs of local infection.  Skin prepped in a sterile fashion.  Local anesthesia: Topical Ethyl chloride.  With sterile technique and under real time ultrasound guidance: moderate effusion noted, 30 mg/2 mL of OrthoVisc (sodium hyaluronate) in a prefilled syringe was injected easily into the knee through a 22-gauge needle. Completed without difficulty  Advised to call if fevers/chills, erythema, induration, drainage, or persistent bleeding.  Images permanently stored and available for review in PACS.  Impression: Technically successful ultrasound guided injection.  Independent interpretation of notes and tests performed by another provider:   None.  Brief History, Exam, Impression, and Recommendations:    Primary osteoarthritis of both knees Failure of steroid injections, PRP by an outside provider, Orthovisc #2 of 4 both knees today. Return in 1 week for #3 of 4 both knees. Co-pay with  $0.    ____________________________________________ [name]. Benjamin Stain, M.D., ABFM., CAQSM., AME. Primary Care and Sports Medicine [place] [facility]  Adjunct Professor of Family Medicine  [place] [employer]  Restaurant manager, fast food

## 2023-01-21 NOTE — Assessment & Plan Note (Signed)
Failure of steroid injections, PRP by an outside provider, Orthovisc #2 of 4 both knees today. Return in 1 week for #3 of 4 both knees. Co-pay with $0.

## 2023-01-26 ENCOUNTER — Encounter: Payer: Self-pay | Admitting: Sports Medicine

## 2023-01-26 ENCOUNTER — Ambulatory Visit (INDEPENDENT_AMBULATORY_CARE_PROVIDER_SITE_OTHER): Payer: Medicare Other | Admitting: Sports Medicine

## 2023-01-26 ENCOUNTER — Other Ambulatory Visit (INDEPENDENT_AMBULATORY_CARE_PROVIDER_SITE_OTHER): Payer: Medicare Other

## 2023-01-26 DIAGNOSIS — M170 Bilateral primary osteoarthritis of knee: Secondary | ICD-10-CM | POA: Diagnosis not present

## 2023-01-26 MED ORDER — HYALURONAN 30 MG/2ML IX SOSY
30.0000 mg | PREFILLED_SYRINGE | Freq: Once | INTRA_ARTICULAR | Status: AC
  Administered 2023-01-26: 30 mg via INTRA_ARTICULAR

## 2023-01-26 NOTE — Progress Notes (Signed)
    Procedures performed today:    Procedure: Real-time Ultrasound Guided injection of the left knee Device: Samsung HS60  Verbal informed consent obtained.  Time-out conducted.  Noted no overlying erythema, induration, or other signs of local infection.  Skin prepped in a sterile fashion.  Local anesthesia: Topical Ethyl chloride.  With sterile technique and under real time ultrasound guidance: Mild effusion noted, 30 mg/2 mL of OrthoVisc (sodium hyaluronate) in a prefilled syringe was injected easily into the knee through a 22-gauge needle. Completed without difficulty  Advised to call if fevers/chills, erythema, induration, drainage, or persistent bleeding.  Images permanently stored and available for review in PACS.  Impression: Technically successful ultrasound guided injection.   Procedure: Real-time Ultrasound Guided injection of the right knee Device: Samsung HS60  Verbal informed consent obtained.  Time-out conducted.  Noted no overlying erythema, induration, or other signs of local infection.  Skin prepped in a sterile fashion.  Local anesthesia: Topical Ethyl chloride.  With sterile technique and under real time ultrasound guidance: moderate effusion noted, 30 mg/2 mL of OrthoVisc (sodium hyaluronate) in a prefilled syringe was injected easily into the knee through a 22-gauge needle. Completed without difficulty  Advised to call if fevers/chills, erythema, induration, drainage, or persistent bleeding.  Images permanently stored and available for review in PACS.  Impression: Technically successful ultrasound guided injection.  Independent interpretation of notes and tests performed by another provider:   None.  Brief History, Exam, Impression, and Recommendations:    Primary osteoarthritis of both knees Failure of steroid injections, PRP by an outside provider, today we did Orthovisc No. 3 of 4 both knees, return in 1 week for #4 of 4, co-pay  $0.    ____________________________________________ [name]. Benjamin Stain, M.D., ABFM., CAQSM., AME. Primary Care and Sports Medicine [place] [facility]  Adjunct Professor of Family Medicine  [place] [employer] [facility]  Restaurant manager, fast food

## 2023-01-26 NOTE — Addendum Note (Signed)
Addended by: Carren Rang A on: 01/26/2023 04:52 PM   Modules accepted: Orders

## 2023-01-26 NOTE — Assessment & Plan Note (Signed)
Failure of steroid injections, PRP by an outside provider, today we did Orthovisc No. 3 of 4 both knees, return in 1 week for #4 of 4, co-pay $0.

## 2023-02-02 ENCOUNTER — Other Ambulatory Visit: Payer: Self-pay

## 2023-02-02 ENCOUNTER — Other Ambulatory Visit (INDEPENDENT_AMBULATORY_CARE_PROVIDER_SITE_OTHER): Payer: Medicare Other

## 2023-02-02 ENCOUNTER — Ambulatory Visit (INDEPENDENT_AMBULATORY_CARE_PROVIDER_SITE_OTHER): Payer: Medicare Other | Admitting: Sports Medicine

## 2023-02-02 DIAGNOSIS — M170 Bilateral primary osteoarthritis of knee: Secondary | ICD-10-CM

## 2023-02-02 DIAGNOSIS — M19011 Primary osteoarthritis, right shoulder: Secondary | ICD-10-CM | POA: Diagnosis not present

## 2023-02-02 MED ORDER — HYALURONAN 30 MG/2ML IX SOSY
30.0000 mg | PREFILLED_SYRINGE | Freq: Once | INTRA_ARTICULAR | Status: AC
  Administered 2023-02-02: 30 mg via INTRA_ARTICULAR

## 2023-02-02 MED ORDER — TRIAMCINOLONE ACETONIDE 40 MG/ML IJ SUSP
40.0000 mg | Freq: Once | INTRAMUSCULAR | Status: AC
  Administered 2023-02-02: 40 mg via INTRAMUSCULAR

## 2023-02-02 NOTE — Assessment & Plan Note (Signed)
Failure PRP by an outside provider.   Orthovisc No. 4 of 4 both knees, return in 6 weeks as needed. Co-pay $0.

## 2023-02-02 NOTE — Addendum Note (Signed)
Addended by: Carren Rang A on: 02/02/2023 04:26 PM   Modules accepted: Orders

## 2023-02-02 NOTE — Assessment & Plan Note (Signed)
Known shoulder osteoarthritis, swelling and pain today. We did an aspiration and injection. Return to see me in 6 weeks for this.

## 2023-02-02 NOTE — Progress Notes (Signed)
    Procedures performed today:    Procedure: Real-time Ultrasound Guided injection of the left knee Device: Samsung HS60  Verbal informed consent obtained.  Time-out conducted.  Noted no overlying erythema, induration, or other signs of local infection.  Skin prepped in a sterile fashion.  Local anesthesia: Topical Ethyl chloride.  With sterile technique and under real time ultrasound guidance: Mild effusion noted, 30 mg/2 mL of OrthoVisc (sodium hyaluronate) in a prefilled syringe was injected easily into the knee through a 22-gauge needle. Completed without difficulty  Advised to call if fevers/chills, erythema, induration, drainage, or persistent bleeding.  Images permanently stored and available for review in PACS.  Impression: Technically successful ultrasound guided injection.   Procedure: Real-time Ultrasound Guided injection of the right knee Device: Samsung HS60  Verbal informed consent obtained.  Time-out conducted.  Noted no overlying erythema, induration, or other signs of local infection.  Skin prepped in a sterile fashion.  Local anesthesia: Topical Ethyl chloride.  With sterile technique and under real time ultrasound guidance: moderate effusion noted, 30 mg/2 mL of OrthoVisc (sodium hyaluronate) in a prefilled syringe was injected easily into the knee through a 22-gauge needle. Completed without difficulty  Advised to call if fevers/chills, erythema, induration, drainage, or persistent bleeding.  Images permanently stored and available for review in PACS.  Impression: Technically successful ultrasound guided injection.  Procedure: Real-time Ultrasound Guided aspiration/injection of right subacromial/subdeltoid bursa Device: Samsung HS60  Verbal informed consent obtained.  Time-out conducted.  Noted no overlying erythema, induration, or other signs of local infection.  Skin prepped in a sterile fashion.  Local anesthesia: Topical Ethyl chloride.  With sterile  technique and under real time ultrasound guidance: 60 mL of clear, straw-colored fluid was aspirated from the subacromial subdeltoid bursa, syringe switched and 1 cc Kenalog 40, 2 cc lidocaine, 2 cc bupivacaine injected easily Completed without difficulty  Advised to call if fevers/chills, erythema, induration, drainage, or persistent bleeding.  Images permanently stored and available for review in PACS.  Impression: Technically successful ultrasound guided aspiration/injection.  Independent interpretation of notes and tests performed by another provider:   None.  Brief History, Exam, Impression, and Recommendations:    Primary osteoarthritis of both knees Failure PRP by an outside provider.   Orthovisc No. 4 of 4 both knees, return in 6 weeks as needed. Co-pay $0.  Primary osteoarthritis, right shoulder Known shoulder osteoarthritis, swelling and pain today. We did an aspiration and injection. Return to see me in 6 weeks for this.    ____________________________________________ [name]. Benjamin Stain, M.D., ABFM., CAQSM., AME. Primary Care and Sports Medicine [place] [facility]  Adjunct Professor of Family Medicine  [place] [employer]  Restaurant manager, fast food

## 2023-03-17 ENCOUNTER — Ambulatory Visit: Payer: Medicare Other | Admitting: Sports Medicine

## 2023-05-27 ENCOUNTER — Other Ambulatory Visit: Payer: Self-pay | Admitting: Sports Medicine

## 2023-05-27 DIAGNOSIS — E119 Type 2 diabetes mellitus without complications: Secondary | ICD-10-CM

## 2023-05-27 NOTE — Telephone Encounter (Signed)
 Copied from CRM [id number]. Topic: Clinical - Medication Refill >> May 27, 2023  4:53 PM Adrianna P wrote: Most Recent Primary Care Visit:  Provider: Monica Becton  Department: [facility]  Visit Type: OFFICE VISIT  Date: 02/02/2023  Medication: pioglitazone (ACTOS) 30 MG tablet  Has the patient contacted their pharmacy? Yes (Agent: If no, request that the patient contact the pharmacy for the refill. If patient does not wish to contact the pharmacy document the reason why and proceed with request.) (Agent: If yes, when and what did the pharmacy advise?)  Is this the correct pharmacy for this prescription? Yes If no, delete pharmacy and type the correct one.  This is the patient's preferred pharmacy:  [id number] - [place], [place] - [address] AT [facility] [address] [place] [address] Phone: [phone] Fax: [phone]  [id number] - [place], [place] - [address] [place] [address] Phone: [phone] Fax: [phone]   Has the prescription been filled recently? No  Is the patient out of the medication? Yes  Has the patient been seen for an appointment in the last year OR does the patient have an upcoming appointment? Yes  Can we respond through MyChart? Yes  Agent: Please be advised that Rx refills may take up to 3 business days. We ask that you follow-up with your pharmacy.

## 2023-05-29 MED ORDER — PIOGLITAZONE HCL 30 MG PO TABS: 30.0000 mg | ORAL_TABLET | Freq: Every day | ORAL | 3 refills | Status: AC

## 2023-05-29 NOTE — Telephone Encounter (Signed)
 Requested medication (s) are due for refill today: yes  Requested medication (s) are on the active medication list: yes  Last refill:  09/06/21 #90 3 RF  Future visit scheduled: no  Notes to clinic:  overdue labs   Requested Prescriptions  Pending Prescriptions Disp Refills   pioglitazone (ACTOS) 30 MG tablet 90 tablet 3    Sig: Take 1 tablet (30 mg total) by mouth daily.     Endocrinology:  Diabetes - Glitazones - pioglitazone Failed - 05/29/2023  9:30 AM      Failed - HBA1C is between 0 and 7.9 and within 180 days    Hemoglobin A1C  Date Value Ref Range Status  03/04/2021 6.9  Final         Passed - Valid encounter within last 6 months    Recent Outpatient Visits           3 months ago Primary osteoarthritis of both knees   [place] [facility] at [facility], [name], MD   4 months ago Primary osteoarthritis of both knees   [facility] at [facility], [name], MD   4 months ago Primary osteoarthritis of both knees   [facility] at [facility], [name], MD   4 months ago Primary osteoarthritis of both knees   [facility] & [facility] Medicine at [facility], [name], MD   5 months ago Essential hypertension, benign   [facility] Medicine at [facility], [name], MD

## 2023-06-24 ENCOUNTER — Telehealth: Payer: Self-pay

## 2023-06-24 DIAGNOSIS — I639 Cerebral infarction, unspecified: Secondary | ICD-10-CM

## 2023-06-24 NOTE — Transitions of Care (Post Inpatient/ED Visit) (Signed)
 06/24/2023  Name: Brian Rose. MRN: [id number] DOB: Jan 05, 1941  Today's TOC FU Call Status: Today's TOC FU Call Status:: Successful TOC FU Call Completed TOC FU Call Complete Date: 06/24/23 Patient's Name and Date of Birth confirmed.  Transition Care Management Follow-up Telephone Call Date of Discharge: 06/23/23 Discharge Facility: Other ([facility] Facility) Name of Other ([facility]) Discharge Facility: [facility] Type of Discharge: Inpatient Admission Primary Inpatient Discharge Diagnosis:: Acute ischemic stroke How have you been since you were released from the hospital?: Better Any questions or concerns?: No (Patient denies)  Items Reviewed: Did you receive and understand the discharge instructions provided?: Yes Medications obtained,verified, and reconciled?: Yes (Medications Reviewed) Any new allergies since your discharge?: No Dietary orders reviewed?: Yes Type of Diet Ordered:: low salt heart healthy carb modified Do you have support at home?: Yes People in Home [RPT]: spouse Name of Support/Comfort Primary Source: wife  Medications Reviewed Today: Medications Reviewed Today     Reviewed by Sharmaine Dearth, RN (Registered Nurse) on 06/24/23 at 1008  Med List Status: <None>   Medication Order Taking? Sig Documenting Provider Last Dose Status Informant  acetaminophen  (TYLENOL ) 500 MG tablet [id number] Yes Take by mouth. [provider] Taking Active   allopurinol (ZYLOPRIM) 100 MG tablet [id number] Yes Take 200 mg by mouth daily. [provider] Taking Active   amLODipine  (NORVASC ) 10 MG tablet [id number] Yes Take 5 mg by mouth daily. Take 2.5mg  as needed for SBP over consistently Gean Keels, MD Taking Active   aspirin 81 MG tablet [id number] Yes Take 81 mg by mouth 2 (two) times daily. [provider] Taking Active   atorvastatin (LIPITOR) 40 MG tablet [id number] Yes Take 40 mg by mouth daily. [provider]  Taking Active   bisacodyl  (BISACODYL  LAXATIVE) 10 MG suppository [id number] No Place 1 suppository (10 mg total) rectally as needed for moderate constipation.  Patient not taking: Reported on 06/24/2023   Leon Rajas, MD Not Taking Consider Medication Status and Discontinue (No longer needed (for PRN medications))   Blood Glucose Monitoring Suppl (BAYER CONTOUR MONITOR) W/DEVICE KIT [id number] Yes Use as needed. Gean Keels, MD Taking Active   calcitRIOL (ROCALTROL) 0.25 MCG capsule [id number] Yes Take by mouth. [provider] Taking Active   carvedilol  (COREG ) 25 MG tablet [id number] Yes Take 6.25 mg by mouth 2 (two) times daily with a meal. Gean Keels, MD Taking Active   clopidogrel (PLAVIX) 75 MG tablet [id number] Yes Take 75 mg by mouth daily. [provider] Taking Active   Cyanocobalamin (B-12) 1000 MCG CAPS [id number] Yes Take by mouth. [provider] Taking Active   docusate sodium (COLACE) 100 MG capsule [id number] Yes Take 100 mg by mouth 2 (two) times daily as needed for mild constipation. [provider] Taking Active   epoetin  alfa-epbx (RETACRIT ) 10000 UNIT/ML injection [id number] Yes Inject into the skin. Once a month [provider] Taking Active   fluticasone  (FLONASE ) 50 MCG/ACT nasal spray [id number] No SHAKE LIQUID AND USE 1 SPRAY IN EACH NOSTRIL DAILY  Patient not taking: Reported on 06/24/2023   Manette Section, MD Not Taking Consider Medication Status and Discontinue (No longer needed (for PRN medications))   folic acid  (FOLVITE ) 800 MCG tablet [id number] Yes Take by mouth. [provider] Taking Active   glucose blood (PRODIGY NO CODING BLOOD GLUC) test strip [id number] Yes 2 (two) times daily. [provider] Taking Active   lactulose  (CHRONULAC ) 10 GM/15ML solution [id number] Yes  Take 15 mLs (10 g total) by mouth 2 (two) times daily.  Patient taking differently: Take 10 g by mouth daily as  needed for moderate constipation.   Leon Rajas, MD Taking Active   melatonin 1 MG TABS tablet [id number] Yes Take 1 mg by mouth at bedtime. [provider] Taking Active   metolazone (ZAROXOLYN) 5 MG tablet [id number] No TAKE 1 TABLET(5 MG) BY MOUTH DAILY AS NEEDED  Patient not taking: Reported on 06/24/2023   [provider] Not Taking Consider Medication Status and Discontinue (No longer needed (for PRN medications))   MULTIPLE VITAMINS-MINERALS PO [id number] No Take 1 tablet by mouth every morning.  Patient not taking: Reported on 06/24/2023   [provider] Not Taking Consider Medication Status and Discontinue (No longer needed (for PRN medications))   Niacin (VITAMIN B-3 PO) [id number] Yes Take by mouth. [provider] Taking Active   nitroGLYCERIN (NITROSTAT) 0.4 MG SL tablet [id number] Yes Place 0.4 mg under the tongue every 5 (five) minutes as needed for chest pain. [provider] Taking Active            Med Note Bernadene Brewer, Cordarrell Sane A   Wed Jun 24, 2023  9:58 AM) Patient reports he has not needed this recently   Omega-3 Fatty Acids (FISH OIL) 1000 MG CAPS [id number] Yes Take by mouth. [provider] Taking Active   pioglitazone  (ACTOS ) 30 MG tablet [id number] Yes Take 1 tablet (30 mg total) by mouth daily. Gean Keels, MD Taking Active   Prodigy Lancets 28G MISC [id number] Yes Use to check blood sugar 2 time(s) daily [provider] Taking Active   pyridOXINE (VITAMIN B6) 100 MG tablet [id number] Yes Take 100 mg by mouth daily. [provider] Taking Active   sodium chloride (OCEAN) 0.65 % SOLN nasal spray [id number] Yes Place 1 spray into both nostrils as needed for congestion. Manette Section, MD Taking Active   torsemide  (DEMADEX ) 100 MG tablet [id number] Yes Take 100 mg by mouth daily. [provider] Taking Active             Home Care and Equipment/Supplies: Were Home Health Services  Ordered?: No Any new equipment or medical supplies ordered?: No  Functional Questionnaire: Do you need assistance with bathing/showering or dressing?: Yes (Wife can help as needed) Do you need assistance with meal preparation?: Yes (wife helps as needed) Do you need assistance with eating?: No Do you need assistance with getting out of bed/getting out of a chair/moving?: Yes (Patient states he is using walker around the house) Do you have difficulty managing or taking your medications?: No  Follow up appointments reviewed: PCP Follow-up appointment confirmed?: Yes Date of PCP follow-up appointment?: 07/02/23 Follow-up Provider: Gean Keels, MD Specialist Hospital Follow-up appointment confirmed?: NA Do you need transportation to your follow-up appointment?: No (Wife will drive him - he was driving prior to admission but has double vision) Do you understand care options if your condition(s) worsen?: Yes-patient verbalized understanding  SDOH Interventions Today    Flowsheet Row Most Recent Value  SDOH Interventions   Food Insecurity Interventions Intervention Not Indicated  Housing Interventions Intervention Not Indicated  Transportation Interventions Intervention Not Indicated  Utilities Interventions Intervention Not Indicated       Goals Addressed             This Visit's Progress    VBCI Transitions of Care (TOC) Care Plan       Problems:  Recent Hospitalization  for treatment of Acute ischemic stroke  Discharge instructions state ambulatory referral for PT/OT and patient states they are supposed to call him to schedule - he does not know location or number to call if he does not hear from them   Goal:  Over the next 30 days, the patient will not experience hospital readmission  Interventions:  Transitions of Care: Doctor Visits  - discussed the importance of doctor visits  Stroke: Reviewed Importance of taking all medications as prescribed Reviewed  Importance of attending all scheduled provider appointments Advised patient to discuss ambulatory referral to PT/OT  with provider if he does not get a call to schedule Assessed social determinant of health barriers Assessed for signs and symptoms of stroke Reviewed referrals to outpatient therapy  Patient Self Care Activities:  Attend all scheduled provider appointments Call pharmacy for medication refills 3-7 days in advance of running out of medications Call provider office for new concerns or questions  Notify RN Care Manager of [facility] call rescheduling needs Participate in Transition of Care Program/Attend TOC scheduled calls Take medications as prescribed    Plan:  Telephone follow up appointment with care management team member scheduled for:  07/03/23 10am The patient has been provided with contact information for the care management team and has been advised to call with any health related questions or concerns.         Tonia Frankel RN, CCM [place]  VBCI-Population Health RN Care Manager [phone]

## 2023-06-24 NOTE — Patient Instructions (Signed)
 Visit Information  Thank you for taking time to visit with me today. Please don't hesitate to contact me if I can be of assistance to you before our next scheduled telephone appointment.  Our next appointment is by telephone on 07/03/23 at 10am  Following is a copy of your care plan:   Goals Addressed             This Visit's Progress    VBCI Transitions of Care (TOC) Care Plan       Problems:  Recent Hospitalization for treatment of Acute ischemic stroke  Discharge instructions state ambulatory referral for PT/OT and patient states they are supposed to call him to schedule - he does not know location or number to call if he does not hear from them   Goal:  Over the next 30 days, the patient will not experience hospital readmission  Interventions:  Transitions of Care: Doctor Visits  - discussed the importance of doctor visits  Stroke: Reviewed Importance of taking all medications as prescribed Reviewed Importance of attending all scheduled provider appointments Advised patient to discuss ambulatory referral to PT/OT  with provider if he does not get a call to schedule Assessed social determinant of health barriers Assessed for signs and symptoms of stroke Reviewed referrals to outpatient therapy  Patient Self Care Activities:  Attend all scheduled provider appointments Call pharmacy for medication refills 3-7 days in advance of running out of medications Call provider office for new concerns or questions  Notify RN Care Manager of [facility] call rescheduling needs Participate in Transition of Care Program/Attend TOC scheduled calls Take medications as prescribed    Plan:  Telephone follow up appointment with care management team member scheduled for:  07/03/23 10am The patient has been provided with contact information for the care management team and has been advised to call with any health related questions or concerns.         Patient verbalizes understanding of  instructions and care plan provided today and agrees to view in MyChart. Active MyChart status and patient understanding of how to access instructions and care plan via MyChart confirmed with patient.     Telephone follow up appointment with care management team member scheduled for:07/03/23 10am The patient has been provided with contact information for the care management team and has been advised to call with any health related questions or concerns.   Please call the care guide team at [phone] if you need to cancel or reschedule your appointment.   Please call the Suicide and Crisis Lifeline: 988 call the [place]  National Suicide Prevention Lifeline: (215)408-6242 or TTY: 929 490 0776 TTY 647-065-1807) to talk to a trained counselor call 911 if you are experiencing a Mental Health or Behavioral Health Crisis or need someone to talk to.  Tonia Frankel RN, CCM [place]  VBCI-Population Health RN Care Manager [phone]

## 2023-07-02 ENCOUNTER — Encounter: Payer: Self-pay | Admitting: Sports Medicine

## 2023-07-02 ENCOUNTER — Ambulatory Visit (INDEPENDENT_AMBULATORY_CARE_PROVIDER_SITE_OTHER): Admitting: Sports Medicine

## 2023-07-02 VITALS — BP 136/67 | HR 78 | Resp 20 | Ht 69.0 in | Wt 156.0 lb

## 2023-07-02 DIAGNOSIS — H4902 Third [oculomotor] nerve palsy, left eye: Secondary | ICD-10-CM | POA: Diagnosis not present

## 2023-07-02 MED ORDER — DIAZEPAM 5 MG PO TABS
5.0000 mg | ORAL_TABLET | Freq: Three times a day (TID) | ORAL | 0 refills | Status: AC | PRN
Start: 2023-07-02 — End: ?

## 2023-07-02 NOTE — Progress Notes (Signed)
    Procedures performed today:    None.  Independent interpretation of notes and tests performed by another provider:   None.  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 Since that he does have ophthalmology referral, I would like neurology consultation as well, ideally neuro-ophthalmology. Vitals were stable, chronic disease processes stable today.    ____________________________________________ Joselyn Nicely. Sandy Crumb, M.D., ABFM., CAQSM., AME. Primary Care and Sports Medicine [facility]  Adjunct Professor of [employer] [facility]  [employer]  Restaurant manager, fast food

## 2023-07-02 NOTE — Assessment & Plan Note (Addendum)
 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 On exam he does have paresis to adduction of the left eye, other motions are intact. 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 Other motor, sensory, coordination functions are intact. Since that he does have ophthalmology referral, I would like neurology consultation as well, ideally neuro-ophthalmology. Vitals were stable, chronic disease processes stable today. Due to his vertical nystagmus I would like to give him some symptom relief in the meantime, Valium is a vestibular suppressant so we will try this.

## 2023-07-03 ENCOUNTER — Other Ambulatory Visit: Payer: Self-pay

## 2023-07-03 NOTE — Transitions of Care (Post Inpatient/ED Visit) (Signed)
 Transition of Care week 2  Visit Note  07/03/2023  Name: Brian Rose. MRN: [id number]          DOB: [birth year]  Situation: Patient enrolled in [facility] 30-day program. Visit completed with patient by telephone.   Background: Patient discharged from [facility] 06/23/23, was having double vision and is being followed by [facility] RN. 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 Patient reported today that he is not taking the statin and states he is not tolerating it. TOC RN discussed purpose of medication and patient states he will discuss with cardiology at appointment today. Patient states he has started outpatient PT/OT and states PCP ordered neurology referral and patient is awaiting call to schedule appointment  Initial Transition Care Management Follow-up Telephone Call    Past Medical History:  Diagnosis Date   Chronic kidney disease    Diabetes (HCC)    Hypertension     Assessment: Patient Reported Symptoms: Cognitive        Neurological Neurological Review of Symptoms: Vision changes (patient continues to report double vision) Neurological Comment: patient continues to report double vision - newly prescribed diazePAM  5 mg Oral  HEENT HEENT Symptoms Reported: Other: Other HEENT Symptoms/Conditions: patient continues to report double vision - newly prescribed diazePAM  5 mg Oral HEENT Comment: patient continues to report double vision - newly prescribed diazePAM  5 mg Oral from PCP appointment 07/02/23    Cardiovascular      Respiratory Respiratory Symptoms Reported: No symptoms reported    Endocrine Patient reports the following symptoms related to hypoglycemia or hyperglycemia  : No symptoms reported Is patient diabetic?: Yes    Gastrointestinal Gastrointestinal Symptoms Reported: No symptoms reported      Genitourinary Additional Genitourinary Details: Patient with ESRD on PD at night at home - follows with nephrology Dr Dwaine Gip in [place] appointment 07/15/23    Integumentary      Musculoskeletal          Psychosocial           There were no vitals filed for this visit.  Medications Reviewed Today     Reviewed by Sharmaine Dearth, RN (Registered Nurse) on 07/03/23 at 1014  Med List Status: <None>   Medication Order Taking? Sig Documenting Provider Last Dose Status Informant  acetaminophen  (TYLENOL ) 500 MG tablet [id number] Yes Take by mouth. [provider] Taking Active   allopurinol (ZYLOPRIM) 100 MG tablet [id number] Yes Take 200 mg by mouth daily. [provider] Taking Active   amLODipine  (NORVASC ) 10 MG tablet [id number] No Take 5 mg by mouth daily. Take 2.5mg  as needed for SBP over consistently  Patient not taking: Reported on 07/03/2023   Gean Keels, MD Not Taking Active            Med Note [facility], [place] A   Fri Jul 03, 2023 10:01 AM) BP reported by patient 07/03/23 113/62  aspirin 81 MG tablet [id number] Yes Take 81 mg by mouth 2 (two) times daily. [provider] Taking Active   atorvastatin (LIPITOR) 40 MG tablet [id number] No Take 40 mg by mouth daily.  Patient not taking: Reported on 07/03/2023   [provider] Not Taking Active  Med Note [facility]er, Catilyn Boggus A   Fri Jul 03, 2023 10:02 AM) Patient states he is not tolerant to statins  bisacodyl  (BISACODYL  LAXATIVE) 10 MG suppository [id number] No Place 1 suppository (10 mg total) rectally as needed for moderate constipation.  Patient not taking: Reported on 07/03/2023   Leon Rajas, MD Not Taking Consider Medication Status and Discontinue (No longer needed (for PRN medications))   Blood Glucose Monitoring Suppl  (BAYER CONTOUR MONITOR) W/DEVICE KIT [id number] Yes Use as needed. Gean Keels, MD Taking Active   calcitRIOL (ROCALTROL) 0.25 MCG capsule [id number] Yes Take by mouth. [provider] Taking Active   carvedilol  (COREG ) 25 MG tablet [id number] Yes Take 6.25 mg by mouth 2 (two) times daily with a meal. Gean Keels, MD Taking Active   clopidogrel (PLAVIX) 75 MG tablet [id number] Yes Take 75 mg by mouth daily. [provider] Taking Active   Cyanocobalamin (B-12) 1000 MCG CAPS [id number] Yes Take by mouth. [provider] Taking Active   diazepam  (VALIUM ) 5 MG tablet [id number] Yes Take 1 tablet (5 mg total) by mouth every 8 (eight) hours as needed (vertigo/dizziness). Gean Keels, MD Taking Active   docusate sodium (COLACE) 100 MG capsule [id number] Yes Take 100 mg by mouth 2 (two) times daily as needed for mild constipation. [provider] Taking Active   epoetin  alfa-epbx (RETACRIT ) 10000 UNIT/ML injection [id number] Yes Inject into the skin. Once a month [provider] Taking Active   fluticasone  (FLONASE ) 50 MCG/ACT nasal spray [id number] No SHAKE LIQUID AND USE 1 SPRAY IN EACH NOSTRIL DAILY  Patient not taking: Reported on 07/03/2023   Manette Section, MD Not Taking Active            Med Note [facility], [place] A   Fri Jul 03, 2023 10:13 AM) Per 06/23/23 [facility] discharge this was discontinued  folic acid  (FOLVITE ) 800 MCG tablet [id number] Yes Take by mouth. [provider] Taking Active   glucose blood (PRODIGY NO CODING BLOOD GLUC) test strip [id number] Yes 2 (two) times daily. [provider] Taking Active   lactulose  (CHRONULAC ) 10 GM/15ML solution [id number] No Take 15 mLs (10 g total) by mouth 2 (two) times daily.  Patient not taking: Reported on 07/03/2023   Leon Rajas, MD Not Taking Active            Med Note [id number], [place] A   Fri Jul 03, 2023 10:09 AM) States he hasn't needed  melatonin 1  MG TABS tablet [id number] Yes Take 1 mg by mouth at bedtime. [provider] Taking Active   metolazone (ZAROXOLYN) 5 MG tablet [id number] No   Patient not taking: Reported on 07/03/2023   [provider] Not Taking Active            Med Note Bern[facility] Brewer, Lasundra Hascall A   Fri Jul 03, 2023 10:12 AM) Per 4/29 [facility] Glasser discharge this was discontinued  MULTIPLE VITAMINS-MINERALS PO [id number] No Take 1 tablet by mouth every morning.  Patient not taking: Reported on 07/03/2023   [provider] Not Taking Consider Medication Status and Discontinue (No longer needed (for PRN medications))            Med Note Bernadene Brewer, Andilyn Bettcher A   Fri Jul 03, 2023 10:13 AM) Per 06/23/23 [facility] discharge this was discontinued  Niacin (VITAMIN B-3 PO) [id number] Yes Take by mouth. [provider] Taking Active   nitroGLYCERIN (NITROSTAT) 0.4 MG SL tablet [id number]  Place 0.4  mg under the tongue every 5 (five) minutes as needed for chest pain.  Patient not taking: Reported on 07/02/2023   [provider]  Consider Medication Status and Discontinue (No longer needed (for PRN medications))            Med Note [facility], Ernestine Langworthy A   Wed Jun 24, 2023  9:58 AM) Patient reports he has not needed this recently   Omega-3 Fatty Acids (FISH OIL) 1000 MG CAPS [id number] Yes Take by mouth. [provider] Taking Active   pioglitazone  (ACTOS ) 30 MG tablet [id number] Yes Take 1 tablet (30 mg total) by mouth daily. Gean Keels, MD Taking Active   Prodigy Lancets 28G MISC [id number] Yes Use to check blood sugar 2 time(s) daily [provider] Taking Active   pyridOXINE (VITAMIN B6) 100 MG tablet [id number] Yes Take 100 mg by mouth daily. [provider] Taking Active   sodium chloride (OCEAN) 0.65 % SOLN nasal spray [id number] No Place 1 spray into both nostrils as needed for congestion.  Patient not taking: Reported on 07/03/2023   Manette Section, MD Not Taking Active    torsemide  (DEMADEX ) 100 MG tablet [id number] Yes Take 100 mg by mouth daily. [provider] Taking Active             Recommendation:   Specialty provider follow-up Cardiology appointment scheduled today 07/03/23 patient to discuss statin and other options  Follow Up Plan:   Telephone follow up appointment date/time:  07/10/23 10am   Tonia Frankel RN, CCM [place]  VBCI-Population Health RN Care Manager [phone]

## 2023-07-10 ENCOUNTER — Telehealth: Payer: Self-pay

## 2023-07-10 NOTE — Transitions of Care (Post Inpatient/ED Visit) (Signed)
 Transition of Care week 3  Visit Note  07/10/2023  Name: Brian Rose. MRN: [id number]          DOB: 05-07-1940  Situation: Patient enrolled in [facility] 30-day program. Visit completed with patient by telephone.   Background: Patient discharged from [facility] 06/23/23, was having double vision and is being followed by [facility] RN. 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 Patient was seen 07/03/23 by cardiology, Dr Lethea Ravel and was restarted on Atorvastatin but at lower 20mg  dose and patient states he is trying this dose. Patient states his oculomotor nerve palsy visit was rescheduled to 07/13/23. Patient is attending OT/PT and continues with PD at night at home.   Initial Transition Care Management Follow-up Telephone Call    Past Medical History:  Diagnosis Date   Chronic kidney disease    Diabetes (HCC)    Hypertension     Assessment: Patient Reported Symptoms: Cognitive Cognitive Status: Alert and oriented to person, place, and time, Normal speech and language skills Cognitive/Intellectual Conditions Management [RPT]: None reported or documented in medical history or problem list      Neurological Neurological Review of Symptoms: Vision changes Neurological Self-Management Outcome: 3 (uncertain) Neurological Comment: Patient reports he feels the double vision has improved "a little" patient states he hasn't taken the diazepam  anymore - he didn't like how it made him feel  HEENT HEENT Symptoms Reported: Other: Other HEENT Symptoms/Conditions: double vision has slightly improved HEENT Self-Management Outcome: 3 (uncertain) HEENT Comment: patients appointment was rescheduled from  07/07/23 to 07/13/23 - states he went to OT yesterday and was told to remove the patch from left eye and wear glasses with tape over left eye    Cardiovascular Cardiovascular Symptoms Reported: No symptoms reported Cardiovascular Conditions: Hypertension Weight: 159 lb 6.3 oz (72.3 kg) Cardiovascular Self-Management Outcome: 3 (uncertain) Cardiovascular Comment: patient states he only takes Amlodipine  when SBP > 150  Respiratory Respiratory Symptoms Reported: No symptoms reported    Endocrine Patient reports the following symptoms related to hypoglycemia or hyperglycemia : No symptoms reported Is patient diabetic?: Yes Is patient checking blood sugars at home?: Yes Endocrine Conditions: Diabetes Endocrine Comment: Patient states before he starts dialysis at night he checks VSS and sugar  Gastrointestinal Gastrointestinal Symptoms Reported: No symptoms reported      Genitourinary Genitourinary Symptoms Reported: Other Additional Genitourinary Details: Patient continues with PD at night at home and still has f/u with nephrology 07/15/23 Genitourinary Conditions: End-stage renal disease  Integumentary      Musculoskeletal          Psychosocial           There were no vitals filed for this visit.  Medications Reviewed Today     Reviewed by Sharmaine Dearth, RN (Registered Nurse) on 07/10/23 at 1024  Med List Status: <None>   Medication Order Taking? Sig Documenting Provider Last Dose Status Informant  acetaminophen  (TYLENOL ) 500 MG tablet [id number] Yes Take by mouth. [provider] Taking Active   allopurinol (ZYLOPRIM) 100 MG tablet [id number] Yes Take 200 mg by mouth daily. [provider] Taking Active   amLODipine  (NORVASC ) 10 MG tablet [id number] Yes Take 5 mg by mouth daily. Take 2.5mg  as needed  for SBP over consistently Gean Keels, MD Taking Active            Med Note [facility], [place] A   Fri Jul 10, 2023 10:13 AM) Patient reports he  took this 07/11/23 when SBP > 150   aspirin 81 MG tablet [id number] Yes Take 81 mg by mouth 2 (two) times daily. [provider] Taking Active   atorvastatin (LIPITOR) 40 MG tablet [id number] Yes Take 40 mg by mouth daily. 07/03/23 cardiology restarted at 20mg  daily -patient trying [provider] Taking Active            Med Note [facility] Brewer, Janille Draughon A   Fri Jul 10, 2023 10:16 AM) Patient is trying the 20mg  dose as prescribed by cardiology 07/03/23  bisacodyl  (BISACODYL  LAXATIVE) 10 MG suppository [id number] No Place 1 suppository (10 mg total) rectally as needed for moderate constipation.  Patient not taking: Reported on 07/10/2023   Leon Rajas, MD Not Taking Active   Blood Glucose Monitoring Suppl (BAYER CONTOUR MONITOR) W/DEVICE KIT [id number] Yes Use as needed. Gean Keels, MD Taking Active   calcitRIOL (ROCALTROL) 0.25 MCG capsule [id number] Yes Take by mouth. [provider] Taking Active   carvedilol  (COREG ) 25 MG tablet [id number] Yes Take 6.25 mg by mouth 2 (two) times daily with a meal. Gean Keels, MD Taking Active   clopidogrel (PLAVIX) 75 MG tablet [id number] Yes Take 75 mg by mouth daily. [provider] Taking Active   Cyanocobalamin (B-12) 1000 MCG CAPS [id number] Yes Take by mouth. [provider] Taking Active   diazepam  (VALIUM ) 5 MG tablet [id number] No Take 1 tablet (5 mg total) by mouth every 8 (eight) hours as needed (vertigo/dizziness).  Patient not taking: Reported on 07/10/2023   Gean Keels, MD Not Taking Active   docusate sodium (COLACE) 100 MG capsule [id number] Yes Take 100 mg by mouth 2 (two) times daily as needed for mild constipation. [provider] Taking Active   epoetin  alfa-epbx (RETACRIT ) 10000 UNIT/ML injection [id number] Yes Inject into the skin. Once a month [provider] Taking Active   fluticasone  (FLONASE ) 50 MCG/ACT nasal spray [id number] No SHAKE LIQUID AND USE 1 SPRAY IN  EACH NOSTRIL DAILY  Patient not taking: Reported on 07/10/2023   Manette Section, MD Not Taking Active            Med Note [facility], Danesha Kirchoff A   Fri Jul 03, 2023 10:13 AM) Per [facility]/23/23 [facility] discharge this was discontinued  folic acid  (FOLVITE ) 800 MCG tablet [id number] Yes Take by mouth. [provider] Taking Active   glucose blood (PRODIGY NO CODING BLOOD GLUC) test strip [id number] Yes 2 (two) times daily. [provider] Taking Active   lactulose  (CHRONULAC ) 10 GM/15ML solution [id number] No Take 15 mLs (10 g total) by mouth 2 (two) times daily.  Patient not taking: Reported on 07/10/2023   Leon Rajas, MD Not Taking Active            Med Note [facility], [place] A   Fri Jul 03, 2023 10:09 AM) States he hasn't needed  melatonin 1 MG TABS tablet [id number] Yes Take 1 mg by mouth at bedtime. [provider] Taking Active   metolazone (ZAROXOLYN) 5 MG tablet [id number] No   Patient not taking: Reported on 07/10/2023   [provider] Not Taking Active            Med Note Bernadene Brewer, [place] A   Fri Jul 03, 2023 10:12 AM) Per 4/29 Vergil Glasser discharge this was discontinued  MULTIPLE VITAMINS-MINERALS PO [id number] No Take 1 tablet by mouth every morning.  Patient not taking: Reported on 07/10/2023   [provider] Not Taking Active            Med Note Bernadene Brewer, [place] A   Fri Jul 03, 2023 10:13 AM) Per 06/23/23 [facility] discharge this was discontinued  Niacin (VITAMIN B-3 PO) [id number] Yes Take by mouth. [provider] Taking Active   nitroGLYCERIN (NITROSTAT) 0.4 MG SL tablet [id number] No Place 0.4 mg under the tongue every 5 (five) minutes as needed for chest pain.  Patient not taking: Reported on 07/10/2023   [provider] Not Taking Active            Med Note [facility], Nilza Eaker A   Wed Jun 24, 2023  9:58 AM) Patient reports he has not needed this recently   Omega-3 Fatty Acids (FISH OIL) 1000 MG CAPS [id number] Yes  Take by mouth. [provider] Taking Active   pioglitazone  (ACTOS ) 30 MG tablet [id number] Yes Take 1 tablet (30 mg total) by mouth daily. Gean Keels, MD Taking Active   Prodigy Lancets 28G MISC [id number] Yes Use to check blood sugar 2 time(s) daily [provider] Taking Active   pyridOXINE (VITAMIN B6) 100 MG tablet [id number] Yes Take 100 mg by mouth daily. [provider] Taking Active   sodium chloride (OCEAN) 0.65 % SOLN nasal spray [id number] No Place 1 spray into both nostrils as needed for congestion.  Patient not taking: Reported on 07/10/2023   Manette Section, MD Not Taking Active   torsemide  (DEMADEX ) 100 MG tablet [id number] Yes Take 100 mg by mouth daily. [provider] Taking Active             Recommendation:   oculomotor nerve palsy visit rescheduled to 5/19 & Nephrology, Dr Charon Copper 07/15/23  Follow Up Plan:   Telephone follow up appointment date/time:  07/17/23 10am  Tonia Frankel RN, CCM [place]  VBCI-Population Health RN Care Manager [phone]

## 2023-07-17 ENCOUNTER — Other Ambulatory Visit: Payer: Self-pay

## 2023-07-17 NOTE — Transitions of Care (Post Inpatient/ED Visit) (Signed)
 Transition of Care week 4  Visit Note  07/17/2023  Name: Brian Rose. MRN: [id number]          DOB: [birth year]  Situation: Patient enrolled in [facility] 30-day program. Visit completed with patient by telephone.   Background: Patient discharged from [facility] 06/23/23, was having double vision and is being followed by [facility] RN. 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 Patient was seen 07/13/23 by ophthalmologist and states MD feels vision issues will resolve on their own but patient will follow up in 6 weeks. Patient also seen by nephrology 07/15/23 and states there were no changes. Patient reported he was resting at beginning of call and [facility] RN advised we will keep call short today. Next call scheduled later in the day as patient states sometimes he does like to sleep in a little   Initial Transition Care Management Follow-up Telephone Call    Past Medical History:  Diagnosis Date   Chronic kidney disease    Diabetes (HCC)    Hypertension     Assessment: Patient Reported Symptoms: Cognitive Cognitive Status: Alert and oriented to person, place, and time, Normal speech and language skills Cognitive/Intellectual Conditions Management [RPT]: None reported or documented in medical history or problem list      Neurological Neurological Review of Symptoms: Vision changes (Patient was seen 5/29 by opthalmologist) Neurological Comment: Patient saw opthalmologist 07/13/23 and states MD feels vision may improve on it's own - will follow up in 6 weeks  HEENT Other HEENT Symptoms/Conditions: continues with double vision but saw opthalmologist 07/13/23 and patient states MD feels this will  resolve on it's own - will follow up in 6 weeks HEENT Conditions: Vision problem(s) Vision problem(s)  Cardiovascular Cardiovascular Symptoms Reported: Not assessed    Respiratory Respiratory Symptoms Reported: No symptoms reported    Endocrine Patient reports the following symptoms related to hypoglycemia or hyperglycemia : No symptoms reported, Not assessed    Gastrointestinal Gastrointestinal Symptoms Reported: No symptoms reported      Genitourinary Additional Genitourinary Details: Patient continues with PD at home at night - saw nephrology 07/15/23 with no changes    Integumentary      Musculoskeletal          Psychosocial           Vitals:   07/17/23 1026  BP: 129/60    Medications Reviewed Today     Reviewed by Sharmaine Dearth, RN (Registered Nurse) on 07/17/23 at 1020  Med List Status: <None>   Medication Order Taking? Sig Documenting Provider Last Dose Status Informant  acetaminophen  (TYLENOL ) 500 MG tablet [id number] Yes Take by mouth. [provider] Taking Active   allopurinol (ZYLOPRIM) 100 MG tablet [id number] Yes Take 200 mg by mouth daily. [provider] Taking Active   amLODipine  (NORVASC ) 10 MG tablet [id number] No Take 5 mg by mouth daily. Take 2.5mg  as needed for SBP over consistently  Patient not taking: Reported on 07/17/2023   Gean Keels, MD Not Taking Active            Med Note [facility], [place] A   Fri Jul 10, 2023 10:13 AM) Patient reports he took this 07/11/23 when SBP > 150  aspirin 81 MG tablet [id number] Yes Take 81 mg by mouth 2 (two) times daily. [provider] Taking Active   atorvastatin (LIPITOR) 40 MG tablet [id number] Yes Take 40 mg by mouth daily. 07/03/23 cardiology restarted at 20mg  daily -patient trying [provider] Taking Active            Med Note Bernadene Brewer, Shigeko Manard A   Fri Jul 10, 2023 10:16 AM) Patient is trying the 20mg  dose as prescribed by cardiology 07/03/23  bisacodyl   (BISACODYL  LAXATIVE) 10 MG suppository [id number] No Place 1 suppository (10 mg total) rectally as needed for moderate constipation.  Patient not taking: Reported on 07/03/2023   Leon Rajas, MD Not Taking Active   Blood Glucose Monitoring Suppl (BAYER CONTOUR MONITOR) W/DEVICE KIT [id number] No Use as needed.  Patient not taking: Reported on 07/17/2023   Gean Keels, MD Not Taking Active   calcitRIOL (ROCALTROL) 0.25 MCG capsule [id number] Yes Take by mouth. [provider] Taking Active   carvedilol  (COREG ) 25 MG tablet [id number] Yes Take 6.25 mg by mouth 2 (two) times daily with a meal. Gean Keels, MD Taking Active   clopidogrel (PLAVIX) 75 MG tablet [id number] Yes Take 75 mg by mouth daily. [provider] Taking Active   Cyanocobalamin (B-12) 1000 MCG CAPS [id number] Yes Take by mouth. [provider] Taking Active   diazepam  (VALIUM ) 5 MG tablet [id number] No Take 1 tablet (5 mg total) by mouth every 8 (eight) hours as needed (vertigo/dizziness).  Patient not taking: Reported on 07/17/2023   Gean Keels, MD Not Taking Active   docusate sodium (COLACE) 100 MG capsule [id number] Yes Take 100 mg by mouth 2 (two) times daily as needed for mild constipation. [provider] Taking Active   epoetin  alfa-epbx (RETACRIT ) 10000 UNIT/ML injection [id number] Yes Inject into the skin. Once a month [provider] Taking Active   fluticasone  (FLONASE ) 50 MCG/ACT nasal spray [id number] No SHAKE LIQUID AND USE 1 SPRAY IN EACH NOSTRIL DAILY  Patient not taking: Reported on 07/17/2023   Manette Section, MD Not Taking Active            Med Note [facility], [place] A   Fri Jul 03, 2023 10:13 AM) Per 06/23/23 [facility] discharge this was discontinued  folic acid  (FOLVITE ) 800 MCG tablet [id number] Yes Take by mouth. [provider] Taking Active   glucose blood (PRODIGY NO CODING BLOOD GLUC) test strip [id number] Yes 2 (two) times  daily. [provider] Taking Active   lactulose  (CHRONULAC ) 10 GM/15ML solution [id number] No Take 15 mLs (10 g total) by mouth 2 (two) times daily.  Patient not taking: Reported on 07/03/2023   Leon Rajas, MD Not Taking Active            Med Note [facility], [place] A   Fri Jul 03, 2023 10:09 AM) States he hasn't needed  melatonin 1 MG TABS tablet [id number] Yes Take 1 mg by mouth at bedtime. [provider] Taking Active   metolazone (ZAROXOLYN) 5 MG tablet [id number] No   Patient not taking: Reported on 07/03/2023   [provider] Not Taking Active            Med Note Bernadene Brewer, Cyril Woodmansee A   Fri Jul 03, 2023 10:12 AM) Per 4/29 [facility] Glasser discharge this was discontinued  MULTIPLE VITAMINS-MINERALS PO [id number] No Take 1 tablet by mouth every morning.  Patient not taking: Reported on 07/03/2023   [provider]  Not Taking Active            Med Note Bernadene Brewer, Siah Kannan A   Fri Jul 03, 2023 10:13 AM) Per 06/23/23 [facility] discharge this was discontinued  Niacin (VITAMIN B-3 PO) [id number] Yes Take by mouth. [provider] Taking Active   nitroGLYCERIN (NITROSTAT) 0.4 MG SL tablet [id number] No Place 0.4 mg under the tongue every 5 (five) minutes as needed for chest pain.  Patient not taking: Reported on 07/02/2023   [provider] Not Taking Active            Med Note [facility], Meko Masterson A   Wed Jun 24, 2023  9:58 AM) Patient reports he has not needed this recently   Omega-3 Fatty Acids (FISH OIL) 1000 MG CAPS [id number] Yes Take by mouth. [provider] Taking Active   pioglitazone  (ACTOS ) 30 MG tablet [id number] Yes Take 1 tablet (30 mg total) by mouth daily. Gean Keels, MD Taking Active   Prodigy Lancets 28G MISC [id number] Yes Use to check blood sugar 2 time(s) daily [provider] Taking Active   pyridOXINE (VITAMIN B6) 100 MG tablet [id number] Yes Take 100 mg by mouth daily. [provider] Taking Active    sodium chloride (OCEAN) 0.65 % SOLN nasal spray [id number] No Place 1 spray into both nostrils as needed for congestion.  Patient not taking: Reported on 07/03/2023   Manette Section, MD Not Taking Active   torsemide  (DEMADEX ) 100 MG tablet [id number] Yes Take 100 mg by mouth daily. [provider] Taking Active             Recommendation:   Continue with follow up appointments as directed and call with any new issues/concerns  Follow Up Plan:   Telephone follow up appointment date/time:  07/24/23 1pm  Tonia Frankel RN, CCM [place]  VBCI-Population Health RN Care Manager [phone]

## 2023-07-24 ENCOUNTER — Other Ambulatory Visit: Payer: Self-pay

## 2023-07-24 VITALS — Wt 159.2 lb

## 2023-07-24 DIAGNOSIS — I639 Cerebral infarction, unspecified: Secondary | ICD-10-CM

## 2023-07-24 NOTE — Transitions of Care (Post Inpatient/ED Visit) (Signed)
 Transition of Care Week 5 [facility] Program Closing  Visit Note  07/24/2023  Name: Brian Rose. MRN: [id number]          DOB: 03/05/1940  Situation: Patient enrolled in [facility] 30-day program. Visit completed with patient by telephone.   Background: Patient discharged from [facility] 06/23/23, was having double vision and is being followed by [facility] RN. 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 Patient has not had any MD visits since last TOC call. Patient states he continues to do well. Feels goals met for [facility] and patient agrees to CCM for chronic health concerns.   Initial Transition Care Management Follow-up Telephone Call    Past Medical History:  Diagnosis Date   Chronic kidney disease    Diabetes (HCC)    Hypertension     Assessment: Patient Reported Symptoms: Cognitive Cognitive Status: Alert and oriented to person, place, and time, Normal speech and language skills Cognitive/Intellectual Conditions Management [RPT]: None reported or documented in medical history or problem list      Neurological Neurological Review of Symptoms: Vision changes (Patient reports he has some improvement with vision) Neurological Comment: patient states improvement has been noted and Opthmalogist felt it would resolvve - Patient has f/u 08/27/23  HEENT        Cardiovascular   Weight: 159 lb 2.8 oz (72.2 kg) Cardiovascular Comment: Patient states he ahs not needed Amlodipine  - states SBP has not been >150  Respiratory Respiratory Symptoms Reported: No symptoms reported    Endocrine Patient reports the following symptoms related to hypoglycemia or hyperglycemia : No symptoms reported Is patient diabetic?: Yes Is patient checking blood sugars at home?: Yes Endocrine Conditions: Diabetes  Gastrointestinal Gastrointestinal Symptoms Reported: No symptoms reported      Genitourinary Additional Genitourinary Details: Patient  continues with PD at home at night    Integumentary Additional Integumentary Details: patient states dialysis stite on abdomen looks good and states he goes monthly to dialysis clinic - but states he has never had an issue with it    Musculoskeletal Musculoskelatal Symptoms Reviewed: No symptoms reported   Falls in the past year?: No Number of falls in past year: 1 or less    Psychosocial Psychosocial Symptoms Reported: No symptoms reported         There were no vitals filed for this visit.  Medications Reviewed Today     Reviewed by Sharmaine Dearth, RN (Registered Nurse) on 07/24/23 at 1307  Med List Status: <None>   Medication Order Taking? Sig Documenting Provider Last Dose Status Informant  acetaminophen  (TYLENOL ) 500 MG tablet [id number] Yes Take by mouth. [provider] Taking Active   allopurinol (ZYLOPRIM) 100 MG tablet [id number] Yes Take 200 mg by mouth daily. [provider] Taking Active   amLODipine  (NORVASC ) 10 MG tablet [id number] No Take 5 mg by mouth daily. Take 2.5mg  as needed for SBP over consistently  Patient not taking: Reported on 07/24/2023   Gean Keels, MD Not Taking Active            Med Note [facility], [place] A   Fri Jul 10, 2023 10:13 AM) Patient reports he took this 07/11/23 when SBP > 150   aspirin 81 MG tablet [id number] Yes Take 81 mg by mouth 2 (two) times daily. [provider] Taking Active   atorvastatin (LIPITOR) 40 MG tablet [id number] Yes Take 40 mg by mouth daily. 07/03/23 cardiology restarted  at 20mg  daily -patient trying [provider] Taking Active            Med Note Bernadene Brewer, Jaydence Arnesen A   Fri Jul 10, 2023 10:16 AM) Patient is trying the 20mg  dose as prescribed by cardiology 07/03/23  bisacodyl  (BISACODYL  LAXATIVE) 10 MG suppository [id number] No Place 1 suppository (10 mg total) rectally as needed for moderate constipation.  Patient not taking: Reported on 07/24/2023   Leon Rajas, MD Not  Taking Active   Blood Glucose Monitoring Suppl (BAYER CONTOUR MONITOR) W/DEVICE KIT [id number] No Use as needed.  Patient not taking: Reported on 07/24/2023   Gean Keels, MD Not Taking Active   calcitRIOL (ROCALTROL) 0.25 MCG capsule [id number] Yes Take by mouth. [provider] Taking Active   carvedilol  (COREG ) 25 MG tablet [id number] Yes Take 6.25 mg by mouth 2 (two) times daily with a meal. Gean Keels, MD Taking Active   clopidogrel (PLAVIX) 75 MG tablet [id number] Yes Take 75 mg by mouth daily. [provider] Taking Active   Cyanocobalamin (B-12) 1000 MCG CAPS [id number] Yes Take by mouth. [provider] Taking Active   diazepam  (VALIUM ) 5 MG tablet [id number] No Take 1 tablet (5 mg total) by mouth every 8 (eight) hours as needed (vertigo/dizziness).  Patient not taking: Reported on 07/10/2023   Gean Keels, MD Not Taking Consider Medication Status and Discontinue (Patient Preference)   docusate sodium (COLACE) 100 MG capsule [id number] No Take 100 mg by mouth 2 (two) times daily as needed for mild constipation.  Patient not taking: Reported on 07/24/2023   [provider] Not Taking Active   epoetin  alfa-epbx (RETACRIT ) 10000 UNIT/ML injection [id number] Yes Inject into the skin. Once a month [provider] Taking Active   fluticasone  (FLONASE ) 50 MCG/ACT nasal spray [id number] No SHAKE LIQUID AND USE 1 SPRAY IN EACH NOSTRIL DAILY  Patient not taking: Reported on 07/24/2023   Manette Section, MD Not Taking Active            Med Note [facility], [facility] A   Fri Jul 03, 2023 10:13 AM) Per 06/23/23 [facility] discharge this was discontinued  folic acid  (FOLVITE ) 800 MCG tablet [id number] Yes Take by mouth. [provider] Taking Active   glucose blood (PRODIGY NO CODING BLOOD GLUC) test strip [id number] Yes 2 (two) times daily. [provider] Taking Active   lactulose  (CHRONULAC ) 10 GM/15ML solution  [id number] No Take 15 mLs (10 g total) by mouth 2 (two) times daily.  Patient not taking: Reported on 07/24/2023   Leon Rajas, MD Not Taking Active            Med Note [facility], [place] A   Fri Jul 03, 2023 10:09 AM) States he hasn't needed  melatonin 1 MG TABS tablet [id number] Yes Take 1 mg by mouth at bedtime. [provider] Taking Active   metolazone (ZAROXOLYN) 5 MG tablet [id number] No   Patient not taking: Reported on 07/24/2023   [provider] Not Taking Active            Med Note Bernadene Brewer, Stiven Kaspar A   Fri Jul 03, 2023 10:12 AM) Per 4/29 Vergil Glasser discharge this was discontinued  MULTIPLE VITAMINS-MINERALS PO [id number] No Take 1 tablet by mouth every morning.  Patient not taking: Reported on 07/24/2023   [provider] Not Taking Active            Med Note [facility], Nihar Klus A   Fri  Jul 03, 2023 10:13 AM) Per 06/23/23 [facility] discharge this was discontinued  Niacin (VITAMIN B-3 PO) [id number] Yes Take by mouth. [provider] Taking Active   nitroGLYCERIN (NITROSTAT) 0.4 MG SL tablet [id number] No Place 0.4 mg under the tongue every 5 (five) minutes as needed for chest pain.  Patient not taking: Reported on 07/24/2023   [provider] Not Taking Active            Med Note [facility], Nohea Kras A   Wed Jun 24, 2023  9:58 AM) Patient reports he has not needed this recently   Omega-3 Fatty Acids (FISH OIL) 1000 MG CAPS [id number] Yes Take by mouth. [provider] Taking Active   pioglitazone  (ACTOS ) 30 MG tablet [id number] Yes Take 1 tablet (30 mg total) by mouth daily. Gean Keels, MD Taking Active   Prodigy Lancets 28G MISC [id number] Yes Use to check blood sugar 2 time(s) daily [provider] Taking Active   pyridOXINE (VITAMIN B6) 100 MG tablet [id number] Yes Take 100 mg by mouth daily. [provider] Taking Active   sodium chloride (OCEAN) 0.65 % SOLN nasal spray [id number] No Place 1 spray into  both nostrils as needed for congestion.  Patient not taking: Reported on 07/24/2023   Manette Section, MD Not Taking Active   torsemide  (DEMADEX ) 100 MG tablet [id number] Yes Take 100 mg by mouth daily. [provider] Taking Active             Recommendation:   Referral to: CCM  Follow Up Plan:   Closing From:  Transitions of Care Program  Tonia Frankel RN, CCM [facility]  VBCI-Population Health RN Care Manager [phone]

## 2023-08-04 ENCOUNTER — Telehealth: Payer: Self-pay | Admitting: *Deleted

## 2023-08-04 NOTE — Progress Notes (Signed)
 Complex Care Management Note  Care Guide Note 08/04/2023 Name: Brian Rose. MRN: [id number] DOB: [birth year]  [name] is a 83 y.o. year old male who sees Thekkekandam, Joselyn Nicely, MD for primary care. I reached out to Mathews Solomons. by phone today to offer complex care management services.  [name] was given information about Complex Care Management services today including:   The Complex Care Management services include support from the care team which includes your Nurse Care Manager, Clinical Social Worker, or Pharmacist.  The Complex Care Management team is here to help remove barriers to the health concerns and goals most important to you. Complex Care Management services are voluntary, and the patient may decline or stop services at any time by request to their care team member.   Complex Care Management Consent Status: Patient agreed to services and verbal consent obtained.   Follow up plan:  Telephone appointment with complex care management team member scheduled for:  08/07/2023  Encounter Outcome:  Patient Scheduled  Kandis Ormond, CMA [place]  [facility], [facility] Guide Direct Dial : [phone]  Fax: [phone] Website: [email or web address]

## 2023-08-07 ENCOUNTER — Other Ambulatory Visit: Payer: Self-pay

## 2023-08-07 NOTE — Patient Instructions (Signed)
 Visit Information  Thank you for taking time to visit with me today. Please don't hesitate to contact me if I can be of assistance to you before our next scheduled telephone appointment.  Our next appointment is by telephone on 7/11 at 10:30 am.  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 as evidenced by adherence to care management team scheduled appointments      Interventions:   Diabetes Interventions: Assessed patient's understanding of A1c goal: <7% Provided education to patient about basic DM disease process Reviewed medications with patient and discussed importance of medication adherence Counseled on importance of regular laboratory monitoring as prescribed Discussed plans with patient for ongoing care management follow up and provided patient with direct contact information for care management team Review of patient status, including review of consultants reports, relevant laboratory and other test results, and medications completed Screening for signs and symptoms of depression related to chronic disease state  Assessed social determinant of health barriers Lab Results  Component Value Date   HGBA1C 6.9 03/04/2021    Evaluation of current treatment plan related to DMII and diplopia, self-management and patient's adherence to plan as established by provider. Discussed plans with patient for ongoing care management follow up and provided patient with direct contact information for care management team Discussed plans with patient for ongoing care  management follow up and provided patient with direct contact information for care management team  Patient Self-Care Activities:  Attend all scheduled provider appointments Call pharmacy for medication refills 3-7 days in advance of running out of medications Call provider office for new concerns or questions  Perform all self care activities independently  Perform IADL's (shopping, preparing meals, housekeeping, managing finances) independently Take medications as prescribed   keep appointment with eye doctor check feet daily for cuts, sores or redness drink 6 to 8 glasses of water each day eat fish at least once per week fill half of plate with vegetables manage portion size do heel pump exercise 2 to 3 times each day wear comfortable, cotton socks wear comfortable, well-fitting shoes  Plan:  The patient has been provided with contact information for the care management team and has been advised to call with any health related questions or concerns.  Next RNCM appointment with Bartholomew Light is 09/04/23 at 10:30 am.             Patient verbalizes understanding of instructions and care plan provided today and agrees to view in MyChart. Active MyChart status and patient understanding of how to access instructions and care plan via MyChart confirmed with patient.     The patient has been provided with contact information for the care management team and has been advised to call with any health related questions or concerns.   Please call the care guide team at [phone] if you need to cancel or reschedule your appointment.   Please call 1-800-273-TALK (toll free, 24 hour hotline) if you are experiencing a Mental Health or  Behavioral Health Crisis or need someone to talk to.  Jakota Manthei A. Saverio Curling RN, BA, [facility], CRRN [place]  [facility] RN Care Manager Direct Dial : [phone]  Fax: [phone]

## 2023-08-08 NOTE — Patient Outreach (Addendum)
 Complex Care Management   Visit Note  08/07/2023  Name:  Brian Rose. 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 I obtained verbal consent from Patient.  Visit completed with patient  on the phone  Background:   Past Medical History:  Diagnosis Date   Chronic kidney disease    Diabetes (HCC)    Hypertension     Assessment: Patient Reported Symptoms:  Cognitive Cognitive Status: Insightful and able to interpret abstract concepts, Alert and oriented to person, place, and time, Normal speech and language skills Cognitive/Intellectual Conditions Management [RPT]: None reported or documented in medical history or problem list   Health Maintenance Behaviors: Annual physical exam, Healthy diet Health Facilitated by: Healthy diet, Rest  Neurological Neurological Review of Symptoms: Vision changes Neurological Conditions:  (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) Neurological Management Strategies: Adequate rest, Coping strategies, Routine screening Neurological Self-Management Outcome: 3 (uncertain) Neurological Comment: patient reports that his diplopia is slowly improving and his Ophthalmologist feels it will resolve on its own  HEENT HEENT Symptoms Reported: Sudden change or loss of vision Other HEENT Symptoms/Conditions: Patient's diplopia has not resolved as of yet but Ophthalmologist feels patient's diplopia will resolve on its own, has follow up with Eye Doctor on 08/27/23 HEENT Conditions: Vision problem(s) Vision Problems:  (diplopia) HEENT Management Strategies: Coping strategies, Adequate rest, Routine screening HEENT Self-Management Outcome: 3 (uncertain) HEENT Comment: Patient's diplopia has not resolved as of yet but Ophthalmologist feels patient's diplopia will resolve on its own, has follow up with Eye Doctor on 08/27/23 Vision problem(s)   Cardiovascular Cardiovascular Symptoms Reported: No symptoms reported Does patient have uncontrolled Hypertension?: No Cardiovascular Conditions: Hypertension, Myocardial infarction Cardiovascular Management Strategies: Medication therapy, Coping strategies, Adequate rest, Diet modification Cardiovascular Self-Management Outcome: 4 (good)  Respiratory Respiratory Symptoms Reported: No symptoms reported    Endocrine Patient reports the following symptoms related to hypoglycemia or hyperglycemia : No symptoms reported Is patient diabetic?: Yes Is patient checking blood sugars at home?: Yes Endocrine Conditions: Diabetes Endocrine Management Strategies: Coping strategies, Adequate rest, Diet modification Endocrine Self-Management Outcome: 4 (good)  Gastrointestinal Gastrointestinal Symptoms Reported: No symptoms reported   Nutrition Risk Screen (CP): No indicators present  Genitourinary Genitourinary Symptoms Reported: Other Other Genitourinary Symptoms: 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 Genitourinary Conditions: Chronic kidney disease, Other Other Genitourinary Conditions: ESRD Genitourinary Management Strategies: Peritoneal dialysis, Adequate rest Peritoneal Dialysis Schedule: nightly Peritoneal Dialysis Last Treatment: 08/06/23 Genitourinary Self-Management Outcome: 4 (good)  Integumentary Integumentary Symptoms Reported: Other, Incision Additional Integumentary Details: Peritoneal Dialysis site is clean, dry, and intact and patient states he is very careful to prevent infection Skin Management Strategies: Routine screening Skin Self-Management Outcome: 4 (good)  Musculoskeletal Musculoskelatal Symptoms Reviewed: No symptoms reported   Falls in the past year?: No Number of falls in past year: 1 or less Patient at Risk for Falls Due to: Impaired vision Fall risk Follow up: Falls evaluation completed  Psychosocial Psychosocial Symptoms Reported: No symptoms  reported   Major Change/Loss/Stressor/Fears (CP): Medical condition, self Quality of Family Relationships: involved, helpful, supportive Do you feel physically threatened by others?: No      07/10/2023   10:32 AM  Depression screen PHQ 2/9  Decreased Interest 0  Down, Depressed, Hopeless 0  PHQ - 2 Score 0    There were no vitals filed for this visit.  Medications Reviewed Today     Reviewed  by Randye Buttner, RN (Registered Nurse) on 08/07/23 at 2324  Med List Status: <None>   Medication Order Taking? Sig Documenting Provider Last Dose Status Informant  acetaminophen  (TYLENOL ) 500 MG tablet [id number] Yes Take by mouth. [provider]  Active   allopurinol (ZYLOPRIM) 100 MG tablet [id number] Yes Take 200 mg by mouth daily. [provider]  Active   amLODipine  (NORVASC ) 10 MG tablet [id number]  Take 5 mg by mouth daily. Take 2.5mg  as needed for SBP over consistently  Patient not taking: Reported on 08/07/2023   Gean Keels, MD  Active            Med Note [facility], [place] A   Fri Jul 10, 2023 10:13 AM) Patient reports he took this 07/11/23 when SBP > 150   aspirin 81 MG tablet [id number] Yes Take 81 mg by mouth 2 (two) times daily. [provider]  Active   atorvastatin (LIPITOR) 40 MG tablet [id number] Yes Take 40 mg by mouth daily. 07/03/23 cardiology restarted at 20mg  daily -patient trying [provider]  Active            Med Note Bernadene Brewer, RHONDA A   Fri Jul 10, 2023 10:16 AM) Patient is trying the 20mg  dose as prescribed by cardiology 07/03/23  bisacodyl  (BISACODYL  LAXATIVE) 10 MG suppository [id number] Yes Place 1 suppository (10 mg total) rectally as needed for moderate constipation. Leon Rajas, MD  Active   Blood Glucose Monitoring Suppl (BAYER CONTOUR MONITOR) W/DEVICE KIT [id number]  Use as needed.  Patient not taking: Reported on 08/07/2023   Gean Keels, MD  Active   calcitRIOL (ROCALTROL) 0.25 MCG capsule  [id number] Yes Take by mouth. [provider]  Active   carvedilol  (COREG ) 25 MG tablet [id number] Yes Take 6.25 mg by mouth 2 (two) times daily with a meal. Gean Keels, MD  Active   clopidogrel (PLAVIX) 75 MG tablet [id number] Yes Take 75 mg by mouth daily. [provider]  Active   Cyanocobalamin (B-12) 1000 MCG CAPS [id number] Yes Take by mouth. [provider]  Active   diazepam  (VALIUM ) 5 MG tablet [id number]  Take 1 tablet (5 mg total) by mouth every 8 (eight) hours as needed (vertigo/dizziness).  Patient not taking: Reported on 08/07/2023   Gean Keels, MD  Active   docusate sodium (COLACE) 100 MG capsule [id number]  Take 100 mg by mouth 2 (two) times daily as needed for mild constipation.  Patient not taking: Reported on 08/07/2023   [provider]  Active   epoetin  alfa-epbx (RETACRIT ) 10000 UNIT/ML injection [id number]  Inject into the skin. Once a month [provider]  Active   fluticasone  (FLONASE ) 50 MCG/ACT nasal spray [id number]  SHAKE LIQUID AND USE 1 SPRAY IN EACH NOSTRIL DAILY  Patient not taking: Reported on 08/07/2023   Manette Section, MD  Active            Med Note [facility], [place] A   Fri Jul 03, 2023 10:13 AM) Per 06/23/23 [facility] discharge this was discontinued  folic acid  (FOLVITE ) 800 MCG tablet [id number] Yes Take by mouth. [provider]  Active   glucose blood (PRODIGY NO CODING BLOOD GLUC) test strip [id number]  2 (two) times daily. [provider]  Active   lactulose  (CHRONULAC ) 10 GM/15ML solution [id number] Yes Take 15 mLs (10 g total) by mouth 2 (two) times daily. Leon Rajas, MD  Active  Med Note Bernadene Brewer, RHONDA A   Fri Jul 03, 2023 10:09 AM) States he hasn't needed  melatonin 1 MG TABS tablet [id number] Yes Take 1 mg by mouth at bedtime. [provider]  Active   metolazone (ZAROXOLYN) 5 MG tablet [id number]    Patient not taking: Reported on 08/07/2023    [provider]  Active            Med Note Bernadene Brewer, [place] A   Fri Jul 03, 2023 10:12 AM) Per 4/29 [facility] discharge this was discontinued  MULTIPLE VITAMINS-MINERALS PO [id number]  Take 1 tablet by mouth every morning.  Patient not taking: Reported on 08/07/2023   [provider]  Active            Med Note Bernadene Brewer, [place] A   Fri Jul 03, 2023 10:13 AM) Per 06/23/23 [facility] discharge this was discontinued  Niacin (VITAMIN B-3 PO) [id number] Yes Take by mouth. [provider]  Active   nitroGLYCERIN (NITROSTAT) 0.4 MG SL tablet [id number]  Place 0.4 mg under the tongue every 5 (five) minutes as needed for chest pain.  Patient not taking: Reported on 08/07/2023   [provider]  Active            Med Note Bernadene Brewer, RHON[place] A   Wed Jun 24, 2023  9:58 AM) Patient reports he has not needed this recently   Omega-3 Fatty Acids (FISH OIL) 1000 MG CAPS [id number] Yes Take by mouth. [provider]  Active   pioglitazone  (ACTOS ) 30 MG tablet [id number] Yes Take 1 tablet (30 mg total) by mouth daily. Gean Keels, MD  Active   Prodigy Lancets 28G [place] [id number]  Use to check blood sugar 2 time(s) daily [provider]  Active   pyridOXINE (VITAMIN B6) 100 MG tablet [id number] Yes Take 100 mg by mouth daily. [provider]  Active   sodium chloride (OCEAN) 0.65 % SOLN nasal spray [id number]  Place 1 spray into both nostrils as needed for congestion.  Patient not taking: Reported on 08/07/2023   Manette Section, MD  Active   torsemide  (DEMADEX ) 100 MG tablet [id number] Yes Take 100 mg by mouth daily. [provider]  Active             Recommendation:   08/17/23, as previously scheduled, PCP Follow-up Specialty provider follow-up : Ophthalmologist Dr. Jerilynn Montenegro on 08/27/23  Follow Up Plan:   Telephone follow up appointment date/time:  09/04/23 at 10:30 am  Rayvon Brandvold A. Saverio Curling RN, BA, [facility], CRRN [place]   [facility] RN Care Manager Direct Dial : [phone]  Fax: [phone]

## 2023-09-04 ENCOUNTER — Other Ambulatory Visit: Payer: Self-pay

## 2023-09-04 NOTE — Patient Instructions (Signed)
 Visit Information  Thank you for taking time to visit with me today. Please don't hesitate to contact me if I can be of assistance to you before our next scheduled telephone appointment.  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 as evidenced by adherence to care management team scheduled appointments      Interventions:   Diabetes Interventions: Assessed patient's understanding of A1c goal: <7% Provided education to patient about basic DM disease process Reviewed medications with patient and discussed importance of medication adherence Counseled on importance of regular laboratory monitoring as prescribed Discussed plans with patient for ongoing care management follow up and provided patient with direct contact information for care management team Review of patient status, including review of consultants reports, relevant laboratory and other test results, and medications completed Screening for signs and symptoms of depression related to chronic disease state  Assessed social determinant of health barriers Lab Results  Component Value Date   HGBA1C 6.9 03/04/2021    Evaluation of current treatment plan related to DMII and diplopia, self-management and patient's adherence to plan as established by provider. Discussed plans with patient for ongoing care management follow up and provided patient with direct contact information for care management team Discussed plans with patient for ongoing care  management follow up and provided patient with direct contact information for care management team  Patient Self-Care Activities:  Attend all scheduled provider appointments Call pharmacy for medication refills 3-7 days in advance of running out of medications Call provider office for new concerns or questions  Perform all self care activities independently  Perform IADL's (shopping, preparing meals, housekeeping, managing finances) independently Take medications as prescribed   keep appointment with eye doctor check feet daily for cuts, sores or redness drink 6 to 8 glasses of water each day eat fish at least once per week fill half of plate with vegetables manage portion size do heel pump exercise 2 to 3 times each day wear comfortable, cotton socks wear comfortable, well-fitting shoes  Plan:  The patient has been provided with contact information for the care management team and has been advised to call with any health related questions or concerns.  Next RNCM appointment with Channing is 10/05/23 at 10:00 am.             Patient verbalizes understanding of instructions and care plan provided today and agrees to view in MyChart. Active MyChart status and patient understanding of how to access instructions and care plan via MyChart confirmed with patient.     The patient has been provided with contact information for the care management team and has been advised to call with any health related questions or concerns.   Please call the care guide team at [phone] if you need to cancel or reschedule your appointment.   Please call 1-800-273-TALK (toll free, 24 hour hotline) if you are experiencing a Mental Health or  Behavioral Health Crisis or need someone to talk to.  Lesle Faron A. Gordy RN, BA, [facility], CRRN [place]  [facility] Population Health RN Care Manager Direct Dial : [phone]  Fax: [phone]

## 2023-09-04 NOTE — Patient Outreach (Signed)
 Complex Care Management   Visit Note  09/04/2023  Name:  Brian Rose. MRN: [id number] DOB: October 15, 1940  Situation: Referral received for Complex Care Management related to DM2 and Diplopia due to Cervical Nerve III Palsy. I obtained verbal consent from Patient.  Visit completed with patient  on the phone  Background:   Past Medical History:  Diagnosis Date   Chronic kidney disease    Diabetes (HCC)    Hypertension     Assessment: Patient Reported Symptoms:  Cognitive Cognitive Status: Alert and oriented to person, place, and time, Normal speech and language skills, Insightful and able to interpret abstract concepts Cognitive/Intellectual Conditions Management [RPT]: None reported or documented in medical history or problem list   Health Maintenance Behaviors: Annual physical exam, Healthy diet  Neurological Neurological Review of Symptoms: Vision changes Neurological Management Strategies: Adequate rest, Coping strategies, Routine screening Neurological Self-Management Outcome: 3 (uncertain) Neurological Comment: patient reported today that his diplopia continues to slowly improve and his Opthalmologist feels it will resolve on its own  HEENT HEENT Symptoms Reported: Sudden change or loss of vision HEENT Management Strategies: Coping strategies, Adequate rest, Routine screening, Medical device (utilizes eyepatch as needed go counter diplopia)    Cardiovascular Cardiovascular Symptoms Reported: No symptoms reported Does patient have uncontrolled Hypertension?: No Cardiovascular Management Strategies: Medication therapy, Coping strategies, Adequate rest, Diet modification Cardiovascular Self-Management Outcome: 4 (good)  Respiratory Respiratory Symptoms Reported: No symptoms reported    Endocrine Endocrine Symptoms Reported: No symptoms reported Is patient diabetic?: Yes Is patient checking blood sugars at home?: Yes List most recent blood sugar readings, include date and time  of day: A1c = 6.1 on 09/01/23 Endocrine Self-Management Outcome: 4 (good)  Gastrointestinal Gastrointestinal Symptoms Reported: No symptoms reported   Nutrition Risk Screen (CP): No indicators present  Genitourinary Genitourinary Symptoms Reported: Other Other Genitourinary Symptoms: Patient is on peritoneal dialysis at home nightly - states he needs to redo a test that is done every 3 months to monitor PD adequacy and recent PDA was not adequate    Integumentary Integumentary Symptoms Reported: No symptoms reported, Incision Skin Management Strategies: Routine screening Skin Self-Management Outcome: 4 (good)  Musculoskeletal Musculoskelatal Symptoms Reviewed: No symptoms reported   Falls in the past year?: No Patient at Risk for Falls Due to: Impaired vision Fall risk Follow up: Falls evaluation completed  Psychosocial Psychosocial Symptoms Reported: No symptoms reported   Major Change/Loss/Stressor/Fears (CP): Medical condition, self Quality of Family Relationships: helpful, involved, supportive Do you feel physically threatened by others?: No      07/10/2023   10:32 AM  Depression screen PHQ 2/9  Decreased Interest 0  Down, Depressed, Hopeless 0  PHQ - 2 Score 0    There were no vitals filed for this visit.  Medications Reviewed Today     Reviewed by Gordy Channing LABOR, RN (Registered Nurse) on 09/04/23 at 1659  Med List Status: <None>   Medication Order Taking? Sig Documenting Provider Last Dose Status Informant  acetaminophen  (TYLENOL ) 500 MG tablet [id number] Yes Take by mouth. [provider]  Active   allopurinol (ZYLOPRIM) 100 MG tablet [id number] Yes Take 200 mg by mouth daily. [provider]  Active   amLODipine  (NORVASC ) 10 MG tablet [id number] Yes Take 5 mg by mouth daily. Take 2.5mg  as needed for SBP over consistently Curtis Debby PARAS, MD  Active            Med Note [facility], [place] A   Fri Jul 10, 2023 10:13  AM) Patient reports he  took this 07/11/23 when SBP > 150   aspirin 81 MG tablet [id number] Yes Take 81 mg by mouth 2 (two) times daily. [provider]  Active   atorvastatin (LIPITOR) 40 MG tablet [id number] Yes Take 40 mg by mouth daily. 07/03/23 cardiology restarted at 20mg  daily -patient trying [provider]  Active            Med Note ANNIE, RHONDA A   Fri Jul 10, 2023 10:16 AM) Patient is trying the 20mg  dose as prescribed by cardiology 07/03/23  bisacodyl  (BISACODYL  LAXATIVE) 10 MG suppository [id number] Yes Place 1 suppository (10 mg total) rectally as needed for moderate constipation. Pauline Garnette LABOR, MD  Active   Blood Glucose Monitoring Suppl (BAYER CONTOUR MONITOR) W/DEVICE KIT [id number] Yes Use as needed. Curtis Debby PARAS, MD  Active   calcitRIOL (ROCALTROL) 0.25 MCG capsule [id number] Yes Take by mouth. [provider]  Active   carvedilol  (COREG ) 25 MG tablet [id number] Yes Take 6.25 mg by mouth 2 (two) times daily with a meal. Curtis Debby PARAS, MD  Active   clopidogrel (PLAVIX) 75 MG tablet [id number] Yes Take 75 mg by mouth daily. [provider]  Active   Cyanocobalamin (B-12) 1000 MCG CAPS [id number] Yes Take by mouth. [provider]  Active   diazepam  (VALIUM ) 5 MG tablet [id number] Yes Take 1 tablet (5 mg total) by mouth every 8 (eight) hours as needed (vertigo/dizziness). Curtis Debby PARAS, MD  Active   docusate sodium (COLACE) 100 MG capsule [id number] Yes Take 100 mg by mouth 2 (two) times daily as needed for mild constipation. [provider]  Active   epoetin  alfa-epbx (RETACRIT ) 10000 UNIT/ML injection [id number] Yes Inject into the skin. Once a month [provider]  Active   fluticasone  (FLONASE ) 50 MCG/ACT nasal spray [id number]  SHAKE LIQUID AND USE 1 SPRAY IN EACH NOSTRIL DAILY  Patient not taking: Reported on 08/07/2023   Colette Torrence GRADE, MD  Active            Med Note [facility], [place] A   Fri Jul 03, 2023 10:13 AM)  Per 06/23/23 [facility] discharge this was discontinued  folic acid  (FOLVITE ) 800 MCG tablet [id number] Yes Take by mouth. [provider]  Active   glucose blood (PRODIGY NO CODING BLOOD GLUC) test strip [id number] Yes 2 (two) times daily. [provider]  Active   lactulose  (CHRONULAC ) 10 GM/15ML solution [id number] Yes Take 15 mLs (10 g total) by mouth 2 (two) times daily. Pauline Garnette LABOR, MD  Active            Med Note [facility], [place] A   Fri Jul 03, 2023 10:09 AM) States he hasn't needed  melatonin 1 MG TABS tablet [id number] Yes Take 1 mg by mouth at bedtime. [provider]  Active   metolazone (ZAROXOLYN) 5 MG tablet [id number] Yes  [provider]  Active            Med Note ANNIE, RHON[place] A   Fri Jul 03, 2023 10:12 AM) Per 4/29 [facility] discharge this was discontinued  MULTIPLE VITAMINS-MINERALS PO [id number]  Take 1 tablet by mouth every morning.  Patient not taking: Reported on 09/04/2023   [provider]  Active            Med Note ANNIE, [place] A   Fri Jul 03, 2023 10:13 AM) Per 06/23/23 [facility] discharge this was discontinued  Niacin (VITAMIN B-3  PO) [id number] Yes Take by mouth. [provider]  Active   nitroGLYCERIN (NITROSTAT) 0.4 MG SL tablet [id number]  Place 0.4 mg under the tongue every 5 (five) minutes as needed for chest pain.  Patient not taking: Reported on 09/04/2023   [provider]  Active            Med Note ANNIE, RHON[place] A   Wed Jun 24, 2023  9:58 AM) Patient reports he has not needed this recently   Omega-3 Fatty Acids (FISH OIL) 1000 MG CAPS [id number] Yes Take by mouth. [provider]  Active   pioglitazone  (ACTOS ) 30 MG tablet [id number] Yes Take 1 tablet (30 mg total) by mouth daily. Curtis Debby PARAS, MD  Active   Prodigy Lancets 28G MISC [id number] Yes Use to check blood sugar 2 time(s) daily [provider]  Active   pyridOXINE (VITAMIN B6) 100 MG tablet  [id number] Yes Take 100 mg by mouth daily. [provider]  Active   sodium chloride (OCEAN) 0.65 % SOLN nasal spray [id number]  Place 1 spray into both nostrils as needed for congestion.  Patient not taking: Reported on 09/04/2023   Colette Torrence GRADE, MD  Active   torsemide  (DEMADEX ) 100 MG tablet [id number] Yes Take 100 mg by mouth daily. [provider]  Active             Recommendation:   Continue Current Plan of Care Follow up with PCP on 10/02/23 @ 2;45 pm  Follow Up Plan:   Telephone follow up appointment date/time:  10/05/23 @ 10am with RN Care Manager Channing LITTIE Channing A. Gordy RN, BA, [facility], CRRN [place]  [facility] RN Care Manager Direct Dial : [phone]  Fax: [phone]

## 2023-10-02 ENCOUNTER — Ambulatory Visit: Admitting: Sports Medicine

## 2023-10-02 ENCOUNTER — Ambulatory Visit (INDEPENDENT_AMBULATORY_CARE_PROVIDER_SITE_OTHER)

## 2023-10-02 ENCOUNTER — Other Ambulatory Visit (INDEPENDENT_AMBULATORY_CARE_PROVIDER_SITE_OTHER)

## 2023-10-02 VITALS — BP 160/73 | HR 77 | Ht 69.0 in | Wt 166.0 lb

## 2023-10-02 DIAGNOSIS — M19011 Primary osteoarthritis, right shoulder: Secondary | ICD-10-CM | POA: Diagnosis not present

## 2023-10-02 DIAGNOSIS — M19012 Primary osteoarthritis, left shoulder: Secondary | ICD-10-CM | POA: Diagnosis not present

## 2023-10-02 DIAGNOSIS — H4902 Third [oculomotor] nerve palsy, left eye: Secondary | ICD-10-CM | POA: Diagnosis not present

## 2023-10-02 MED ORDER — TRIAMCINOLONE ACETONIDE 40 MG/ML IJ SUSP
40.0000 mg | Freq: Once | INTRAMUSCULAR | Status: AC
  Administered 2023-10-02: 40 mg via INTRA_ARTICULAR

## 2023-10-02 NOTE — Progress Notes (Signed)
    Procedures performed today:    Procedure: Real-time Ultrasound Guided aspiration/injection of the left glenohumeral joint Device: Samsung HS60  Verbal informed consent obtained.  Time-out conducted.  Noted no overlying erythema, induration, or other signs of local infection.  Skin prepped in a sterile fashion.  Local anesthesia: Topical Ethyl chloride.  With sterile technique and under real time ultrasound guidance: arthritic joint noted, effusion noted, I aspirated 120 mL of serosanguineous fluid, syringe switched and 1 cc Kenalog  40, 2 cc lidocaine , 2 cc bupivacaine injected easily Completed without difficulty  Advised to call if fevers/chills, erythema, induration, drainage, or persistent bleeding.  Images permanently stored and available for review in PACS.  Impression: Technically successful ultrasound guided aspiration/injection.  Independent interpretation of notes and tests performed by another provider:   None.  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 On exam he does have paresis to adduction of the left eye, other motions are intact. 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 Other motor, sensory, coordination functions are intact. Since that he does have ophthalmology referral, I would like neurology consultation as well, ideally neuro-ophthalmology. Vitals were stable, chronic disease processes stable today. Due to his vertical nystagmus I would like to give him some symptom  relief in the meantime, Valium  is a vestibular suppressant so we will try this.  Update: After extensive workup with ophthalmology and ENT his symptoms have actually resolved.  Exam is normal as well.  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    ____________________________________________ Debby PARAS. Curtis, M.D., ABFM., CAQSM., AME. Primary Care and Sports Medicine [facility]  Adjunct Professor of [facility] Medicine  [employer]  Restaurant manager, fast food

## 2023-10-02 NOTE — Assessment & Plan Note (Signed)
 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 On exam he does have paresis to adduction of the left eye, other motions are intact. 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 Other motor, sensory, coordination functions are intact. Since that he does have ophthalmology referral, I would like neurology consultation as well, ideally neuro-ophthalmology. Vitals were stable, chronic disease processes stable today. Due to his vertical nystagmus I would like to give him some symptom relief in the meantime, Valium  is a vestibular suppressant so we will try this.  Update: After extensive workup with ophthalmology and ENT his symptoms have actually resolved.  Exam is normal as well.

## 2023-10-02 NOTE — Addendum Note (Signed)
 Addended by: OLIVA-AVELLANEDA, Mena Simonis L on: 10/02/2023 03:59 PM   Modules accepted: Orders

## 2023-10-02 NOTE — Addendum Note (Signed)
 Addended by: CURTIS DEBBY PARAS on: 10/02/2023 03:44 PM   Modules accepted: Orders

## 2023-10-02 NOTE — Assessment & Plan Note (Addendum)
 Known bilateral shoulder osteoarthritis, right side doing okay, glenohumeral aspiration and injection on the left, x-rays, return in 6 weeks.

## 2023-10-04 ENCOUNTER — Ambulatory Visit: Payer: Self-pay | Admitting: Sports Medicine

## 2023-10-05 ENCOUNTER — Other Ambulatory Visit: Payer: Self-pay

## 2023-10-05 NOTE — Patient Outreach (Signed)
 Complex Care Management   Visit Note  10/05/2023  Name:  Brian Rose. 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 I obtained verbal consent from Patient.  Visit completed with patient  on the phone  Background:   Past Medical History:  Diagnosis Date   Chronic kidney disease    Diabetes (HCC)    Hypertension     Assessment: Patient Reported Symptoms:  Cognitive Cognitive Status: Normal speech and language skills, Alert and oriented to person, place, and time, Insightful and able to interpret abstract concepts Cognitive/Intellectual Conditions Management [RPT]: None reported or documented in medical history or problem list   Health Maintenance Behaviors: Annual physical exam, Healthy diet Health Facilitated by: Healthy diet, Rest  Neurological Neurological Review of Symptoms: Vision changes Neurological Management Strategies: Adequate rest, Coping strategies, Routine screening Neurological Self-Management Outcome: 3 (uncertain) Neurological Comment: 10/02/2023 patient reports that his diplopia continues to slowly but steadily improve  HEENT HEENT Symptoms Reported: Sudden change or loss of vision HEENT Management Strategies: Coping strategies, Adequate rest, Routine screening, Medical device HEENT Self-Management Outcome: 4 (good) Vision problem(s)  Cardiovascular Cardiovascular Symptoms Reported: No symptoms reported Does patient have uncontrolled Hypertension?: No Cardiovascular Management Strategies: Medication therapy, Coping strategies, Adequate rest, Diet modification Cardiovascular Self-Management Outcome: 4 (good)  Respiratory Respiratory Symptoms Reported: No symptoms reported    Endocrine Endocrine Symptoms Reported: No symptoms reported Is patient diabetic?: Yes Is patient checking blood sugars at home?: Yes Endocrine  Self-Management Outcome: 4 (good)  Gastrointestinal Gastrointestinal Symptoms Reported: No symptoms reported   Nutrition Risk Screen (CP): No indicators present  Genitourinary Genitourinary Symptoms Reported: Other (Patient on peritoneal dialysis) Genitourinary Management Strategies: Peritoneal dialysis, Adequate rest Peritoneal Dialysis Schedule: nightly Peritoneal Dialysis Last Treatment: 10/04/23 Genitourinary Self-Management Outcome: 4 (good)  Integumentary Integumentary Symptoms Reported: No symptoms reported    Musculoskeletal Musculoskelatal Symptoms Reviewed: Joint pain Additional Musculoskeletal Details: Suffers from osteoarthritis to bilateral shoulders Musculoskeletal Management Strategies: Coping strategies, Medication therapy, Routine screening Musculoskeletal Self-Management Outcome: 4 (good) Musculoskeletal Comment: Peridically receives steroid injections to shoulders - received injection on 10/02/23 Falls in the past year?: No    Psychosocial Psychosocial Symptoms Reported: No symptoms reported     Quality of Family Relationships: helpful, involved, supportive Do you feel physically threatened by others?: No      07/10/2023   10:32 AM  Depression screen PHQ 2/9  Decreased Interest 0  Down, Depressed, Hopeless 0  PHQ - 2 Score 0    There were no vitals filed for this visit.  Medications Reviewed Today     Reviewed by Gordy Channing LABOR, RN (Registered Nurse) on 10/05/23 at 1044  Med List Status: <None>   Medication Order Taking? Sig Documenting Provider Last Dose Status Informant  acetaminophen  (TYLENOL ) 500 MG tablet [id number] Yes Take by mouth. [provider]  Active   allopurinol (ZYLOPRIM) 100 MG tablet [id number] Yes Take 200 mg by mouth daily. [provider]  Active   amLODipine  (NORVASC ) 10 MG tablet [id number] Yes Take 5 mg by mouth daily. Take 2.5mg  as needed for SBP over consistently Curtis Debby PARAS, MD  Active             Med Note [facility], [place] [facility]   Fri Jul 10, 2023 10:13 AM) Patient reports he took this 07/11/23 when SBP > 150   aspirin 81 MG tablet [id number] Yes Take 81  mg by mouth 2 (two) times daily. [provider]  Active   atorvastatin (LIPITOR) 40 MG tablet [id number] Yes Take 40 mg by mouth daily. 07/03/23 cardiology restarted at 20mg  daily -patient trying [provider]  Active            Med Note ANNIE, RHONDA A   Fri Jul 10, 2023 10:16 AM) Patient is trying the 20mg  dose as prescribed by cardiology 07/03/23  bisacodyl  (BISACODYL  LAXATIVE) 10 MG suppository [id number] Yes Place 1 suppository (10 mg total) rectally as needed for moderate constipation. Pauline Garnette LABOR, MD  Active   Blood Glucose Monitoring Suppl (BAYER CONTOUR MONITOR) W/DEVICE KIT [id number] Yes Use as needed. Curtis Debby PARAS, MD  Active   calcitRIOL (ROCALTROL) 0.25 MCG capsule [id number] Yes Take by mouth. [provider]  Active   carvedilol  (COREG ) 25 MG tablet [id number] Yes Take 6.25 mg by mouth 2 (two) times daily with a meal. Curtis Debby PARAS, MD  Active   clopidogrel (PLAVIX) 75 MG tablet [id number] Yes Take 75 mg by mouth daily. [provider]  Active   Cyanocobalamin (B-12) 1000 MCG CAPS [id number] Yes Take by mouth. [provider]  Active   diazepam  (VALIUM ) 5 MG tablet [id number] Yes Take 1 tablet (5 mg total) by mouth every 8 (eight) hours as needed (vertigo/dizziness). Curtis Debby PARAS, MD  Active   docusate sodium (COLACE) 100 MG capsule [id number] Yes Take 100 mg by mouth 2 (two) times daily as needed for mild constipation. [provider]  Active   epoetin  alfa-epbx (RETACRIT ) 10000 UNIT/ML injection [id number] Yes Inject into the skin. Once a month [provider]  Active   fluticasone  (FLONASE ) 50 MCG/ACT nasal spray [id number]  SHAKE LIQUID AND USE 1 SPRAY IN EACH NOSTRIL DAILY  Patient not taking: Reported on 10/05/2023   Colette Torrence GRADE, MD   Active            Med Note [facility], [place] [facility]   Fri Jul 03, 2023 10:13 AM) Per 06/23/23 [facility] discharge this was discontinued  folic acid  (FOLVITE ) 800 MCG tablet [id number] Yes Take by mouth. [provider]  Active   glucose blood (PRODIGY NO CODING BLOOD GLUC) test strip [id number] Yes 2 (two) times daily. [provider]  Active   lactulose  (CHRONULAC ) 10 GM/15ML solution [id number]  Take 15 mLs (10 g total) by mouth 2 (two) times daily.  Patient not taking: Reported on 10/05/2023   Pauline Garnette LABOR, MD  Active            Med Note [facility], [place] [facility]   Fri Jul 03, 2023 10:09 AM) States he hasn't needed  melatonin 1 MG TABS tablet [id number] Yes Take 1 mg by mouth at bedtime.  Patient taking differently: Take 1 mg by mouth at bedtime as needed.   [provider]  Active   metolazone (ZAROXOLYN) 5 MG tablet [id number]    Patient not taking: Reported on 10/05/2023   [provider]  Active            Med Note ANNIE, [place]   Fri Jul 03, 2023 10:12 AM) Per 4/29 [facility] discharge this was discontinued  MULTIPLE VITAMINS-MINERALS PO [id number]  Take 1 tablet by mouth every morning.  Patient not taking: Reported on 10/05/2023   [provider]  Active            Med Note ANNIE, [place] [facility]   Fri Jul 03, 2023 10:13 AM) Per  06/23/23 [facility] discharge this was discontinued  Niacin (VITAMIN B-3 PO) [id number] Yes Take by mouth. [provider]  Active   nitroGLYCERIN (NITROSTAT) 0.4 MG SL tablet [id number] Yes Place 0.4 mg under the tongue every 5 (five) minutes as needed for chest pain. [provider]  Active            Med Note ANNIE, RHONDA A   Wed Jun 24, 2023  9:58 AM) Patient reports he has not needed this recently   Omega-3 Fatty Acids (FISH OIL) 1000 MG CAPS [id number] Yes Take by mouth. [provider]  Active   pioglitazone  (ACTOS ) 30 MG tablet [id number] Yes Take 1 tablet (30 mg total) by mouth daily.  Curtis Debby PARAS, MD  Active   Prodigy Lancets 28G MISC [id number] Yes Use to check blood sugar 2 time(s) daily [provider]  Active   pyridOXINE (VITAMIN B6) 100 MG tablet [id number] Yes Take 100 mg by mouth daily. [provider]  Active   sodium chloride (OCEAN) 0.65 % SOLN nasal spray [id number] Yes Place 1 spray into both nostrils as needed for congestion. Colette Torrence GRADE, MD  Active   torsemide  (DEMADEX ) 100 MG tablet [id number] Yes Take 100 mg by mouth daily. [provider]  Active             Recommendation:   Continue Current Plan of Care  Follow Up Plan:   Telephone follow up appointment date/time:  11/02/2023 at 11 am with [facility] Manager Zeyna Mkrtchyan L.   Kyian Obst A. Gordy RN, BA, [facility], CRRN [place]  [facility] RN Care Manager Direct Dial : [phone]  Fax: [phone]

## 2023-10-05 NOTE — Patient Instructions (Signed)
 Visit Information  Thank you for taking time to visit with me today. Please don't hesitate to contact me if I can be of assistance to you before our next scheduled telephone appointment.  Our next appointment is by telephone on 11/02/2023 at 11:00 am.  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 as evidenced by adherence to care management team scheduled appointments      Interventions:   Diabetes Interventions: Assessed patient's understanding of A1c goal: <7% Provided education to patient about basic DM disease process Reviewed medications with patient and discussed importance of medication adherence Counseled on importance of regular laboratory monitoring as prescribed Discussed plans with patient for ongoing care management follow up and provided patient with direct contact information for care management team Review of patient status, including review of consultants reports, relevant laboratory and other test results, and medications completed Screening for signs and symptoms of depression related to chronic disease state  Assessed social determinant of health barriers Lab Results  Component Value Date   HGBA1C 6.9 03/04/2021    Evaluation of current treatment plan related to DMII and diplopia, self-management and patient's adherence to plan as established by provider. Discussed plans with patient for ongoing care management follow up and provided patient with direct contact information for care management team Discussed plans with patient for ongoing care  management follow up and provided patient with direct contact information for care management team  Patient Self-Care Activities:  Attend all scheduled provider appointments Call pharmacy for medication refills 3-7 days in advance of running out of medications Call provider office for new concerns or questions  Perform all self care activities independently  Perform IADL's (shopping, preparing meals, housekeeping, managing finances) independently Take medications as prescribed   keep appointment with eye doctor check feet daily for cuts, sores or redness drink 6 to 8 glasses of water each day eat fish at least once per week fill half of plate with vegetables manage portion size do heel pump exercise 2 to 3 times each day wear comfortable, cotton socks wear comfortable, well-fitting shoes  Plan:  The patient has been provided with contact information for the care management team and has been advised to call with any health related questions or concerns.  Next RNCM appointment with Channing is 11/02/2023 at 11:00 am.             Patient verbalizes understanding of instructions and care plan provided today and agrees to view in MyChart. Active MyChart status and patient understanding of how to access instructions and care plan via MyChart confirmed with patient.     The patient has been provided with contact information for the care management team and has been advised to call with any health related questions or concerns.   Please call the care guide team at [phone] if you need to cancel or reschedule your appointment.   Please call 1-800-273-TALK (toll free, 24 hour hotline) if you are experiencing a Mental Health or  Behavioral Health Crisis or need someone to talk to.  Ginna Schuur A. Gordy RN, BA, [facility], CRRN   [facility] RN Care Manager Direct Dial : [phone]  Fax: [phone]

## 2023-10-27 ENCOUNTER — Encounter: Payer: Self-pay | Admitting: Sports Medicine

## 2023-11-02 ENCOUNTER — Other Ambulatory Visit: Payer: Self-pay

## 2023-11-04 NOTE — Patient Outreach (Signed)
 11/02/2023   Spoke to patient at agreed-upon scheduled appointment on 11/02/23. Patient states he is doing very well and his health has been quite stable. He still has mild residual diplopia, due to the diagnosis of Oculomotor Nerve Palsy earlier this year, and the patient understands from his ENT Specialist that this is expected to completely resolve slowly over several months.  Patient continues with Peritoneal dialysis at home without incident. Patient to continue current care plan.  Next appointment with patient scheduled for 11/30/23 at 11 am.  Channing A. Gordy RN, BA, [facility], CRRN   [facility] RN Care Manager Direct Dial : [phone]  Fax: [phone]

## 2023-11-04 NOTE — Patient Instructions (Signed)
 Visit Information  Thank you for taking time to visit with me today. Please don't hesitate to contact me if I can be of assistance to you before our next scheduled telephone appointment.  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 as evidenced by adherence to care management team scheduled appointments      Interventions:   Diabetes Interventions: Assessed patient's understanding of A1c goal: <7% Provided education to patient about basic DM disease process Reviewed medications with patient and discussed importance of medication adherence Counseled on importance of regular laboratory monitoring as prescribed Discussed plans with patient for ongoing care management follow up and provided patient with direct contact information for care management team Review of patient status, including review of consultants reports, relevant laboratory and other test results, and medications completed Screening for signs and symptoms of depression related to chronic disease state  Assessed social determinant of health barriers Lab Results  Component Value Date   HGBA1C 6.9 03/04/2021    Evaluation of current treatment plan related to DMII and diplopia, self-management and patient's adherence to plan as established by provider. Discussed plans with patient for ongoing care management follow up and provided patient with direct contact information for care management team Discussed plans with patient for ongoing care  management follow up and provided patient with direct contact information for care management team  Patient Self-Care Activities:  Attend all scheduled provider appointments Call pharmacy for medication refills 3-7 days in advance of running out of medications Call provider office for new concerns or questions  Perform all self care activities independently  Perform IADL's (shopping, preparing meals, housekeeping, managing finances) independently Take medications as prescribed   keep appointment with eye doctor check feet daily for cuts, sores or redness drink 6 to 8 glasses of water each day eat fish at least once per week fill half of plate with vegetables manage portion size do heel pump exercise 2 to 3 times each day wear comfortable, cotton socks wear comfortable, well-fitting shoes  Plan:  The patient has been provided with contact information for the care management team and has been advised to call with any health related questions or concerns.  Next RNCM appointment with Channing is 11/30/2023 at 11:00 am.             Patient verbalizes understanding of instructions and care plan provided today and agrees to view in MyChart. Active MyChart status and patient understanding of how to access instructions and care plan via MyChart confirmed with patient.     The patient has been provided with contact information for the care management team and has been advised to call with any health related questions or concerns.   Please call the care guide team at [phone] if you need to cancel or reschedule your appointment.   Please call 1-800-273-TALK (toll free, 24 hour hotline) if you are experiencing a Mental Health or  Behavioral Health Crisis or need someone to talk to.  Jniya Madara A. Gordy RN, BA, [facility], CRRN [place]  [facility] RN Care Manager Direct Dial : [phone]  Fax: [phone]

## 2023-11-13 ENCOUNTER — Ambulatory Visit: Admitting: Sports Medicine

## 2023-11-17 ENCOUNTER — Ambulatory Visit

## 2023-11-17 ENCOUNTER — Encounter: Payer: Self-pay | Admitting: Sports Medicine

## 2023-11-17 ENCOUNTER — Ambulatory Visit (INDEPENDENT_AMBULATORY_CARE_PROVIDER_SITE_OTHER): Admitting: Sports Medicine

## 2023-11-17 VITALS — BP 130/86 | Ht 69.0 in | Wt 166.0 lb

## 2023-11-17 DIAGNOSIS — M19011 Primary osteoarthritis, right shoulder: Secondary | ICD-10-CM

## 2023-11-17 DIAGNOSIS — M19012 Primary osteoarthritis, left shoulder: Secondary | ICD-10-CM

## 2023-11-17 NOTE — Progress Notes (Signed)
 Patient ID: Brian Rose., male   DOB: 10-Jul-1940, 83 y.o.   MRN: [id number]  Patient is a very pleasant 83 year old male that presents today for follow-up on left shoulder pain.  He is a patient of Dr. Nathalie.  Left shoulder was recently aspirated and injected in August.  This did result in some pain relief.  He still admits to a little bit of soreness in both shoulders.  Tylenol  seems to work pretty good for him.  Examination today does show limited range of motion in both shoulders with atrophy.  Given his overall improvement, we will hold on further treatment at this time.  Of note, he is not a good surgical candidate due to his chronic renal failure.  He will follow-up with us  as needed.  This note was dictated using Dragon naturally speaking software and may contain errors in syntax, spelling, or content which have not been identified prior to signing this note.

## 2023-11-30 ENCOUNTER — Other Ambulatory Visit: Payer: Self-pay

## 2023-12-01 NOTE — Patient Outreach (Signed)
 Complex Care Management   Visit Note  11/30/2023  Name:  Brian Rose. MRN: [id number] DOB: 03/01/1940  Situation: Referral received for Complex Care Management related to ESRD, Diabetes with Complications, and Diplopia, and Osteoarthritis of bilateral shoulders. I obtained verbal consent from Patient.  Visit completed with Patient  on the phone  Background:   Past Medical History:  Diagnosis Date   Chronic kidney disease    Diabetes (HCC)    Hypertension     Assessment: Patient Reported Symptoms:  Cognitive Cognitive Status: Normal speech and language skills, Alert and oriented to person, place, and time, Insightful and able to interpret abstract concepts Cognitive/Intellectual Conditions Management [RPT]: None reported or documented in medical history or problem list   Health Maintenance Behaviors: Annual physical exam, Healthy diet Healing Pattern: Unsure Health Facilitated by: Healthy diet, Rest  Neurological Neurological Review of Symptoms: Vision changes Neurological Management Strategies: Adequate rest, Coping strategies, Routine screening Neurological Self-Management Outcome: 3 (uncertain) Neurological Comment: 11/30/23 diplopia still present but patient feels it is manageable  HEENT HEENT Symptoms Reported: Sudden change or loss of vision HEENT Management Strategies: Coping strategies, Adequate rest, Routine screening, Medical device HEENT Self-Management Outcome: 4 (good) HEENT Comment: patient's diplopia has not resolved but patient feels it is manageable Vision problem(s)  Cardiovascular Cardiovascular Symptoms Reported: No symptoms reported    Respiratory Respiratory Symptoms Reported: No symptoms reported    Endocrine Endocrine Symptoms Reported: No symptoms reported Is patient diabetic?: Yes Is patient checking blood sugars at home?: Yes    Gastrointestinal Gastrointestinal Symptoms Reported: No symptoms reported      Genitourinary Genitourinary Symptoms  Reported: Other Other Genitourinary Symptoms: 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 - patient states well-managed Genitourinary Management Strategies: Peritoneal dialysis, Adequate rest Peritoneal Dialysis Schedule: nightly Peritoneal Dialysis Last Treatment: 11/30/23 Genitourinary Self-Management Outcome: 4 (good)  Integumentary Integumentary Symptoms Reported: No symptoms reported    Musculoskeletal Musculoskelatal Symptoms Reviewed: Joint pain Additional Musculoskeletal Details: Suffers from osteoarthritis to bilateral shoulders Musculoskeletal Management Strategies: Coping strategies, Medication therapy, Routine screening Musculoskeletal Self-Management Outcome: 4 (good) Falls in the past year?: No    Psychosocial Psychosocial Symptoms Reported: No symptoms reported          12/01/2023    PHQ2-9 Depression Screening   Little interest or pleasure in doing things    Feeling down, depressed, or hopeless    PHQ-2 - Total Score    Trouble falling or staying asleep, or sleeping too much    Feeling tired or having little energy    Poor appetite or overeating     Feeling bad about yourself - or that you are a failure or have let yourself or your family down    Trouble concentrating on things, such as reading the newspaper or watching television    Moving or speaking so slowly that other people could have noticed.  Or the opposite - being so fidgety or restless that you have been moving around a lot more than usual    Thoughts that you would be better off dead, or hurting yourself in some way    PHQ2-9 Total Score    If you checked off any problems, how difficult have these problems made it for you to do your work, take care of things at home, or get along with other people    Depression Interventions/Treatment      There were no vitals filed for this visit.  Medications Reviewed Today     Reviewed by Gordy Channing LABOR, RN (Registered  Nurse) on 11/30/23 at 1141  Med List Status: <None>    Medication Order Taking? Sig Documenting Provider Last Dose Status Informant  acetaminophen  (TYLENOL ) 500 MG tablet [id number] Yes Take by mouth. [provider]  Active   allopurinol (ZYLOPRIM) 100 MG tablet [id number] Yes Take 200 mg by mouth daily. [provider]  Active   amLODipine  (NORVASC ) 10 MG tablet [id number] Yes Take 5 mg by mouth daily. Take 2.5mg  as needed for SBP over consistently Curtis Debby PARAS, MD  Active            Med Note [facility] [place] [facility]   Fri Jul 10, 2023 10:13 AM) Patient reports he took this 07/11/23 when SBP > 150   aspirin 81 MG tablet [id number] Yes Take 81 mg by mouth 2 (two) times daily. [provider]  Active   atorvastatin (LIPITOR) 40 MG tablet [id number] Yes Take 40 mg by mouth daily. 07/03/23 cardiology restarted at 20mg  daily -patient trying [provider]  Active            Med Note ANNIE, RHONDA A   Fri Jul 10, 2023 10:16 AM) Patient is trying the 20mg  dose as prescribed by cardiology 07/03/23  bisacodyl  (BISACODYL  LAXATIVE) 10 MG suppository [id number] Yes Place 1 suppository (10 mg total) rectally as needed for moderate constipation. Pauline Garnette LABOR, MD  Active   Blood Glucose Monitoring Suppl (BAYER CONTOUR MONITOR) W/DEVICE KIT [id number] Yes Use as needed. Curtis Debby PARAS, MD  Active   calcitRIOL (ROCALTROL) 0.25 MCG capsule [id number] Yes Take by mouth. [provider]  Active   carvedilol  (COREG ) 25 MG tablet [id number] Yes Take 6.25 mg by mouth 2 (two) times daily with a meal. Curtis Debby PARAS, MD  Active   clopidogrel (PLAVIX) 75 MG tablet [id number] Yes Take 75 mg by mouth daily. [provider]  Active   Cyanocobalamin (B-12) 1000 MCG CAPS [id number] Yes Take by mouth. [provider]  Active   diazepam  (VALIUM ) 5 MG tablet [id number] Yes Take 1 tablet (5 mg total) by mouth every 8 (eight) hours as needed (vertigo/dizziness). Curtis Debby PARAS, MD  Active    docusate sodium (COLACE) 100 MG capsule [id number] Yes Take 100 mg by mouth 2 (two) times daily as needed for mild constipation. [provider]  Active   epoetin  alfa-epbx (RETACRIT ) 10000 UNIT/ML injection [id number] Yes Inject into the skin. Once a month [provider]  Active   fluticasone  (FLONASE ) 50 MCG/ACT nasal spray [id number] Yes SHAKE LIQUID AND USE 1 SPRAY IN EACH NOSTRIL DAILY Colette Torrence GRADE, MD  Active            Med Note ANNIE, RHONDA A   Fri Jul 03, 2023 10:13 AM) Per 06/23/23 [facility] discharge this was discontinued  folic acid  (FOLVITE ) 800 MCG tablet [id number] Yes Take by mouth. [provider]  Active   glucose blood (PRODIGY NO CODING BLOOD GLUC) test strip [id number] Yes 2 (two) times daily. [provider]  Active   lactulose  (CHRONULAC ) 10 GM/15ML solution [id number] Yes Take 15 mLs (10 g total) by mouth 2 (two) times daily. Pauline Garnette LABOR, MD  Active            Med Note [facility], [place] [facility]   Fri Jul 03, 2023 10:09 AM) States he hasn't needed  melatonin 1 MG TABS tablet [id number] Yes Take 1 mg by mouth at bedtime. [provider]  Active   metolazone (ZAROXOLYN) 5 MG tablet [id number]  Patient not taking: Reported on 11/30/2023   [provider]  Active            Med Note ANNIE, [place]   Fri Jul 03, 2023 10:12 AM) Per 4/29 [facility] discharge this was discontinued  MULTIPLE VITAMINS-MINERALS PO [id number]  Take 1 tablet by mouth every morning.  Patient not taking: Reported on 11/30/2023   [provider]  Active            Med Note ANNIE, [place]   Fri Jul 03, 2023 10:13 AM) Per 06/23/23 [facility] discharge this was discontinued  Niacin (VITAMIN B-3 PO) [id number] Yes Take by mouth. [provider]  Active   nitroGLYCERIN (NITROSTAT) 0.4 MG SL tablet [id number] Yes Place 0.4 mg under the tongue every 5 (five) minutes as needed for chest pain. [provider]  Active             Med Note ANNIE, RHONDA A   Wed Jun 24, 2023  9:58 AM) Patient reports he has not needed this recently   Omega-3 Fatty Acids (FISH OIL) 1000 MG CAPS [id number] Yes Take by mouth. [provider]  Active   pioglitazone  (ACTOS ) 30 MG tablet [id number] Yes Take 1 tablet (30 mg total) by mouth daily. Curtis Debby PARAS, MD  Active   Prodigy Lancets 28G MISC [id number] Yes Use to check blood sugar 2 time(s) daily [provider]  Active   pyridOXINE (VITAMIN B6) 100 MG tablet [id number] Yes Take 100 mg by mouth daily. [provider]  Active   sodium chloride (OCEAN) 0.65 % SOLN nasal spray [id number] Yes Place 1 spray into both nostrils as needed for congestion. Colette Torrence GRADE, MD  Active   torsemide  (DEMADEX ) 100 MG tablet [id number] Yes Take 100 mg by mouth daily. [provider]  Active             Recommendation:   Continue Current Plan of Care  Follow Up Plan:   Telephone follow up appointment date/time:  12/28/2023 at 11am with RN Care Manager Channing LITTIE Channing A. Gordy RN, BA, [facility], CRRN [place]  [facility] RN Care Manager Direct Dial : [phone]  Fax: [phone]

## 2023-12-01 NOTE — Patient Instructions (Signed)
 Visit Information  Thank you for taking time to visit with me today. Please don't hesitate to contact me if I can be of assistance to you before our next scheduled telephone appointment.  Our next appointment is by telephone on 12/28/23 at 11 am.  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 as evidenced by adherence to care management team scheduled appointments      Interventions:   Diabetes Interventions: Assessed patient's understanding of A1c goal: <7% Provided education to patient about basic DM disease process Reviewed medications with patient and discussed importance of medication adherence Counseled on importance of regular laboratory monitoring as prescribed Discussed plans with patient for ongoing care management follow up and provided patient with direct contact information for care management team Review of patient status, including review of consultants reports, relevant laboratory and other test results, and medications completed Screening for signs and symptoms of depression related to chronic disease state  Assessed social determinant of health barriers Lab Results  Component Value Date   HGBA1C 6.9 03/04/2021    Evaluation of current treatment plan related to DMII and diplopia, self-management and patient's adherence to plan as established by provider. Discussed plans with patient for ongoing care management follow up and provided patient with direct contact information for care management team Discussed plans with patient for ongoing care  management follow up and provided patient with direct contact information for care management team  Patient Self-Care Activities:  Attend all scheduled provider appointments Call pharmacy for medication refills 3-7 days in advance of running out of medications Call provider office for new concerns or questions  Perform all self care activities independently  Perform IADL's (shopping, preparing meals, housekeeping, managing finances) independently Take medications as prescribed   keep appointment with eye doctor check feet daily for cuts, sores or redness drink 6 to 8 glasses of water each day eat fish at least once per week fill half of plate with vegetables manage portion size do heel pump exercise 2 to 3 times each day wear comfortable, cotton socks wear comfortable, well-fitting shoes  Plan:  The patient has been provided with contact information for the care management team and has been advised to call with any health related questions or concerns.  Next RNCM appointment with Channing is 12/28/2023 at 11:00 am.             Patient verbalizes understanding of instructions and care plan provided today and agrees to view in MyChart. Active MyChart status and patient understanding of how to access instructions and care plan via MyChart confirmed with patient.     The patient has been provided with contact information for the care management team and has been advised to call with any health related questions or concerns.   Please call the care guide team at [phone] if you need to cancel or reschedule your appointment.   Please call 1-800-273-TALK (toll free, 24 hour hotline) if you are experiencing a Mental Health or  Behavioral Health Crisis or need someone to talk to.  Brian Rose A. Gordy RN, BA, [facility], CRRN Uncertain  [facility] RN Care Manager Direct Dial : [phone]  Fax: [phone]

## 2023-12-28 ENCOUNTER — Other Ambulatory Visit: Payer: Self-pay

## 2024-01-07 ENCOUNTER — Telehealth: Payer: Self-pay | Admitting: *Deleted

## 2024-01-07 ENCOUNTER — Ambulatory Visit

## 2024-01-07 ENCOUNTER — Ambulatory Visit: Admitting: Podiatry

## 2024-01-07 ENCOUNTER — Encounter: Payer: Self-pay | Admitting: Podiatry

## 2024-01-07 DIAGNOSIS — B353 Tinea pedis: Secondary | ICD-10-CM | POA: Diagnosis not present

## 2024-01-07 DIAGNOSIS — M79671 Pain in right foot: Secondary | ICD-10-CM

## 2024-01-07 DIAGNOSIS — M79672 Pain in left foot: Secondary | ICD-10-CM

## 2024-01-07 DIAGNOSIS — M069 Rheumatoid arthritis, unspecified: Secondary | ICD-10-CM

## 2024-01-07 DIAGNOSIS — E1142 Type 2 diabetes mellitus with diabetic polyneuropathy: Secondary | ICD-10-CM

## 2024-01-07 MED ORDER — KETOCONAZOLE 2 % EX CREA: 1.0000 | TOPICAL_CREAM | Freq: Every day | CUTANEOUS | 2 refills | Status: AC

## 2024-01-07 MED ORDER — GABAPENTIN 300 MG PO CAPS: 300.0000 mg | ORAL_CAPSULE | Freq: Every day | ORAL | 3 refills | Status: AC

## 2024-01-07 NOTE — Telephone Encounter (Signed)
 I called and left a message for Brian Rose asking him to come in at 2:40 pm.  Go to imaging first for xrays, then come to us  afterward.  I asked him to call if he has any questions.

## 2024-01-07 NOTE — Progress Notes (Signed)
  Subjective:  Patient ID: Brian Macdonald Raddle., male    DOB: March 21, 1940,   MRN: [id number]  Chief Complaint  Patient presents with   Foot Pain    I have pain in my feet, usually on the heel and on the sides.    83 y.o. male presents for bilateral foot numbness and pain. Relates the feet also stay cold. Was seen almost a year ago for a similar problem.  Does not recall being seen for this in the past.  Denies any current treatments.. Denies any other pedal complaints. He is diabetic and last A1c was 6.9 Denies n/v/f/c.   Past Medical History:  Diagnosis Date   Chronic kidney disease    Diabetes (HCC)    Hypertension     Objective:  Physical Exam: Vascular: DP/PT pulses 2/4 bilateral. CFT <3 seconds. Normal hair growth on digits. No edema.  Skin. No lacerations or abrasions bilateral feet. Nails1-5 bilateral thickened elongated and discolored.  Small rash noted to lateral right ankle with scaling and erythema present. Musculoskeletal: MMT 5/5 bilateral lower extremities in DF, PF, Inversion and Eversion. Deceased ROM in DF of ankle joint.  Bilateral HAV deformity noted.  No pain to palpation bilateral. Neurological: Sensation intact to light touch. Protective sensation mildly diminished  Assessment:   1. Type 2 diabetes mellitus with peripheral neuropathy (HCC)   2. Tinea pedis of both feet       Plan:  Patient was evaluated and treated and all questions answered. X-rays reviewed and discussed with patient. No acute fractures or dislocations noted.  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 Pes planus bilateral.  -Discussed and educated patient on diabetic foot care, especially with  regards to the vascular, neurological and musculoskeletal systems.  -Stressed the importance of good glycemic control and the detriment of not  controlling glucose levels in relation to the  foot. -Discussed supportive shoes at all times and checking feet regularly.  - Ketoconazole sent to pharmacy for tinea pedis And gabapentin sent to pharmacy 300 mg to be taken nightly will see if this helps with the neuropathy pains -Answered all patient questions -Patient to return  in 3 months for recheck of medications -Patient advised to call the office if any problems or questions arise in the meantime.   Asberry Failing, DPM

## 2024-04-07 ENCOUNTER — Ambulatory Visit: Admitting: Podiatry
